# Patient Record
Sex: Female | Born: 1945
Health system: Southern US, Community
[De-identification: ages and names within clinical notes are randomized; demographics above are authoritative.]

## PROBLEM LIST (undated history)

## (undated) DIAGNOSIS — Z87442 Personal history of urinary calculi: Secondary | ICD-10-CM

## (undated) DIAGNOSIS — Z79899 Other long term (current) drug therapy: Secondary | ICD-10-CM

## (undated) DIAGNOSIS — M545 Low back pain, unspecified: Secondary | ICD-10-CM

## (undated) DIAGNOSIS — Z9289 Personal history of other medical treatment: Secondary | ICD-10-CM

## (undated) DIAGNOSIS — F419 Anxiety disorder, unspecified: Secondary | ICD-10-CM

## (undated) DIAGNOSIS — R51 Headache: Secondary | ICD-10-CM

## (undated) DIAGNOSIS — E7800 Pure hypercholesterolemia, unspecified: Secondary | ICD-10-CM

## (undated) DIAGNOSIS — J189 Pneumonia, unspecified organism: Secondary | ICD-10-CM

## (undated) DIAGNOSIS — Z87891 Personal history of nicotine dependence: Secondary | ICD-10-CM

## (undated) DIAGNOSIS — M1990 Unspecified osteoarthritis, unspecified site: Secondary | ICD-10-CM

## (undated) DIAGNOSIS — F32A Depression, unspecified: Secondary | ICD-10-CM

## (undated) DIAGNOSIS — G8929 Other chronic pain: Secondary | ICD-10-CM

## (undated) DIAGNOSIS — N289 Disorder of kidney and ureter, unspecified: Secondary | ICD-10-CM

## (undated) DIAGNOSIS — I10 Essential (primary) hypertension: Secondary | ICD-10-CM

## (undated) DIAGNOSIS — F329 Major depressive disorder, single episode, unspecified: Secondary | ICD-10-CM

## (undated) HISTORY — PX: JOINT REPLACEMENT: SHX530

## (undated) HISTORY — PX: CARPAL TUNNEL RELEASE: SHX101

## (undated) HISTORY — PX: LAPAROSCOPIC CHOLECYSTECTOMY: SUR755

## (undated) HISTORY — DX: Personal history of nicotine dependence: Z87.891

## (undated) HISTORY — DX: Other long term (current) drug therapy: Z79.899

## (undated) HISTORY — PX: KNEE ARTHROSCOPY: SUR90

## (undated) HISTORY — PX: TOTAL HIP ARTHROPLASTY: SHX124

## (undated) HISTORY — PX: BACK SURGERY: SHX140

## (undated) HISTORY — DX: Disorder of kidney and ureter, unspecified: N28.9

---

## 1977-04-04 HISTORY — PX: VAGINAL HYSTERECTOMY: SUR661

## 2003-03-05 ENCOUNTER — Other Ambulatory Visit: Payer: Self-pay

## 2004-06-07 ENCOUNTER — Emergency Department: Payer: Self-pay | Admitting: Emergency Medicine

## 2005-02-17 ENCOUNTER — Ambulatory Visit: Payer: Self-pay | Admitting: Family Medicine

## 2005-04-04 DIAGNOSIS — Z9289 Personal history of other medical treatment: Secondary | ICD-10-CM

## 2005-04-04 HISTORY — DX: Personal history of other medical treatment: Z92.89

## 2005-08-31 ENCOUNTER — Ambulatory Visit: Payer: Self-pay | Admitting: Specialist

## 2006-04-12 ENCOUNTER — Ambulatory Visit: Payer: Self-pay | Admitting: Family Medicine

## 2006-05-22 ENCOUNTER — Emergency Department: Payer: Self-pay | Admitting: Unknown Physician Specialty

## 2006-05-22 ENCOUNTER — Other Ambulatory Visit: Payer: Self-pay

## 2007-05-24 ENCOUNTER — Ambulatory Visit: Payer: Self-pay | Admitting: Family Medicine

## 2008-05-27 ENCOUNTER — Ambulatory Visit: Payer: Self-pay | Admitting: Family Medicine

## 2009-02-06 ENCOUNTER — Ambulatory Visit: Payer: Self-pay | Admitting: Family Medicine

## 2009-06-01 ENCOUNTER — Ambulatory Visit: Payer: Self-pay | Admitting: Family Medicine

## 2009-10-22 ENCOUNTER — Encounter: Payer: Self-pay | Admitting: Physician Assistant

## 2009-11-02 ENCOUNTER — Encounter: Payer: Self-pay | Admitting: Physician Assistant

## 2010-02-18 ENCOUNTER — Ambulatory Visit: Payer: Self-pay | Admitting: Pain Medicine

## 2010-06-17 ENCOUNTER — Ambulatory Visit: Payer: Self-pay | Admitting: Pain Medicine

## 2010-06-22 ENCOUNTER — Ambulatory Visit: Payer: Self-pay | Admitting: Family Medicine

## 2010-06-28 ENCOUNTER — Ambulatory Visit: Payer: Self-pay | Admitting: Pain Medicine

## 2010-07-07 ENCOUNTER — Ambulatory Visit: Payer: Self-pay | Admitting: Pain Medicine

## 2010-08-03 ENCOUNTER — Ambulatory Visit: Payer: Self-pay | Admitting: Pain Medicine

## 2010-12-12 ENCOUNTER — Emergency Department: Payer: Self-pay | Admitting: Internal Medicine

## 2011-01-10 ENCOUNTER — Ambulatory Visit: Payer: Self-pay | Admitting: Neurosurgery

## 2011-06-14 DIAGNOSIS — Z1239 Encounter for other screening for malignant neoplasm of breast: Secondary | ICD-10-CM | POA: Diagnosis not present

## 2011-06-14 DIAGNOSIS — M2550 Pain in unspecified joint: Secondary | ICD-10-CM | POA: Diagnosis not present

## 2011-06-14 DIAGNOSIS — I10 Essential (primary) hypertension: Secondary | ICD-10-CM | POA: Diagnosis not present

## 2011-06-15 DIAGNOSIS — I10 Essential (primary) hypertension: Secondary | ICD-10-CM | POA: Diagnosis not present

## 2011-07-01 DIAGNOSIS — J029 Acute pharyngitis, unspecified: Secondary | ICD-10-CM | POA: Diagnosis not present

## 2011-07-01 DIAGNOSIS — H6690 Otitis media, unspecified, unspecified ear: Secondary | ICD-10-CM | POA: Diagnosis not present

## 2011-07-26 ENCOUNTER — Ambulatory Visit: Payer: Self-pay | Admitting: Family Medicine

## 2011-07-26 DIAGNOSIS — Z1231 Encounter for screening mammogram for malignant neoplasm of breast: Secondary | ICD-10-CM | POA: Diagnosis not present

## 2011-09-16 DIAGNOSIS — F329 Major depressive disorder, single episode, unspecified: Secondary | ICD-10-CM | POA: Diagnosis not present

## 2011-09-16 DIAGNOSIS — G4700 Insomnia, unspecified: Secondary | ICD-10-CM | POA: Diagnosis not present

## 2011-09-16 DIAGNOSIS — F3289 Other specified depressive episodes: Secondary | ICD-10-CM | POA: Diagnosis not present

## 2011-09-16 DIAGNOSIS — M1990 Unspecified osteoarthritis, unspecified site: Secondary | ICD-10-CM | POA: Diagnosis not present

## 2011-09-16 DIAGNOSIS — I10 Essential (primary) hypertension: Secondary | ICD-10-CM | POA: Diagnosis not present

## 2011-11-15 DIAGNOSIS — I10 Essential (primary) hypertension: Secondary | ICD-10-CM | POA: Diagnosis not present

## 2011-11-15 DIAGNOSIS — F329 Major depressive disorder, single episode, unspecified: Secondary | ICD-10-CM | POA: Diagnosis not present

## 2011-11-15 DIAGNOSIS — M24469 Recurrent dislocation, unspecified knee: Secondary | ICD-10-CM | POA: Diagnosis not present

## 2011-11-15 DIAGNOSIS — Z96659 Presence of unspecified artificial knee joint: Secondary | ICD-10-CM | POA: Diagnosis not present

## 2011-11-15 DIAGNOSIS — F3289 Other specified depressive episodes: Secondary | ICD-10-CM | POA: Diagnosis not present

## 2012-01-30 ENCOUNTER — Emergency Department: Payer: Self-pay | Admitting: Emergency Medicine

## 2012-02-03 ENCOUNTER — Emergency Department: Payer: Self-pay | Admitting: Emergency Medicine

## 2012-02-03 DIAGNOSIS — M545 Low back pain, unspecified: Secondary | ICD-10-CM | POA: Diagnosis not present

## 2012-02-03 DIAGNOSIS — F3289 Other specified depressive episodes: Secondary | ICD-10-CM | POA: Diagnosis not present

## 2012-02-03 DIAGNOSIS — M129 Arthropathy, unspecified: Secondary | ICD-10-CM | POA: Diagnosis not present

## 2012-02-03 DIAGNOSIS — F329 Major depressive disorder, single episode, unspecified: Secondary | ICD-10-CM | POA: Diagnosis not present

## 2012-02-03 DIAGNOSIS — Z96649 Presence of unspecified artificial hip joint: Secondary | ICD-10-CM | POA: Diagnosis not present

## 2012-02-03 DIAGNOSIS — M5137 Other intervertebral disc degeneration, lumbosacral region: Secondary | ICD-10-CM | POA: Diagnosis not present

## 2012-02-03 DIAGNOSIS — IMO0002 Reserved for concepts with insufficient information to code with codable children: Secondary | ICD-10-CM | POA: Diagnosis not present

## 2012-02-03 DIAGNOSIS — I10 Essential (primary) hypertension: Secondary | ICD-10-CM | POA: Diagnosis not present

## 2012-02-03 DIAGNOSIS — Z79899 Other long term (current) drug therapy: Secondary | ICD-10-CM | POA: Diagnosis not present

## 2012-03-06 ENCOUNTER — Other Ambulatory Visit: Payer: Self-pay | Admitting: Neurosurgery

## 2012-03-06 DIAGNOSIS — M48061 Spinal stenosis, lumbar region without neurogenic claudication: Secondary | ICD-10-CM

## 2012-03-15 DIAGNOSIS — IMO0002 Reserved for concepts with insufficient information to code with codable children: Secondary | ICD-10-CM | POA: Diagnosis not present

## 2012-03-15 DIAGNOSIS — Z23 Encounter for immunization: Secondary | ICD-10-CM | POA: Diagnosis not present

## 2012-03-15 DIAGNOSIS — E7800 Pure hypercholesterolemia, unspecified: Secondary | ICD-10-CM | POA: Diagnosis not present

## 2012-03-15 DIAGNOSIS — L659 Nonscarring hair loss, unspecified: Secondary | ICD-10-CM | POA: Diagnosis not present

## 2012-03-15 DIAGNOSIS — Z1159 Encounter for screening for other viral diseases: Secondary | ICD-10-CM | POA: Diagnosis not present

## 2012-03-15 DIAGNOSIS — I10 Essential (primary) hypertension: Secondary | ICD-10-CM | POA: Diagnosis not present

## 2012-03-15 DIAGNOSIS — G4700 Insomnia, unspecified: Secondary | ICD-10-CM | POA: Diagnosis not present

## 2012-03-21 ENCOUNTER — Other Ambulatory Visit: Payer: Self-pay | Admitting: Neurosurgery

## 2012-03-21 ENCOUNTER — Ambulatory Visit
Admission: RE | Admit: 2012-03-21 | Discharge: 2012-03-21 | Disposition: A | Discharge status: Home / Self Care | Payer: Medicare Other | Source: Ambulatory Visit | Attending: Neurosurgery | Admitting: Neurosurgery

## 2012-03-21 DIAGNOSIS — M48061 Spinal stenosis, lumbar region without neurogenic claudication: Secondary | ICD-10-CM | POA: Diagnosis not present

## 2012-03-21 DIAGNOSIS — M47817 Spondylosis without myelopathy or radiculopathy, lumbosacral region: Secondary | ICD-10-CM | POA: Diagnosis not present

## 2012-03-21 DIAGNOSIS — M4310 Spondylolisthesis, site unspecified: Secondary | ICD-10-CM | POA: Diagnosis not present

## 2012-03-21 DIAGNOSIS — Z006 Encounter for examination for normal comparison and control in clinical research program: Secondary | ICD-10-CM | POA: Diagnosis not present

## 2012-03-21 DIAGNOSIS — M5126 Other intervertebral disc displacement, lumbar region: Secondary | ICD-10-CM | POA: Diagnosis not present

## 2012-03-21 MED ORDER — GADOBENATE DIMEGLUMINE 529 MG/ML IV SOLN
15.0000 mL | Freq: Once | INTRAVENOUS | Status: AC | PRN
  Administered 2012-03-21: 15 mL via INTRAVENOUS

## 2012-03-30 ENCOUNTER — Encounter (HOSPITAL_COMMUNITY): Payer: Self-pay | Admitting: Pharmacy Technician

## 2012-03-30 ENCOUNTER — Encounter (HOSPITAL_COMMUNITY)
Admission: RE | Admit: 2012-03-30 | Discharge: 2012-03-30 | Disposition: A | Discharge status: Home / Self Care | Payer: Medicare Other | Source: Ambulatory Visit | Attending: Neurosurgery | Admitting: Neurosurgery

## 2012-03-30 ENCOUNTER — Ambulatory Visit (HOSPITAL_COMMUNITY)
Admission: RE | Admit: 2012-03-30 | Discharge: 2012-03-30 | Disposition: A | Discharge status: Home / Self Care | Payer: Medicare Other | Source: Ambulatory Visit | Attending: Neurosurgery | Admitting: Neurosurgery

## 2012-03-30 ENCOUNTER — Encounter (HOSPITAL_COMMUNITY): Payer: Self-pay

## 2012-03-30 DIAGNOSIS — Z01818 Encounter for other preprocedural examination: Secondary | ICD-10-CM | POA: Diagnosis not present

## 2012-03-30 HISTORY — DX: Depression, unspecified: F32.A

## 2012-03-30 HISTORY — DX: Anxiety disorder, unspecified: F41.9

## 2012-03-30 HISTORY — DX: Unspecified osteoarthritis, unspecified site: M19.90

## 2012-03-30 HISTORY — DX: Major depressive disorder, single episode, unspecified: F32.9

## 2012-03-30 HISTORY — DX: Essential (primary) hypertension: I10

## 2012-03-30 LAB — SURGICAL PCR SCREEN
MRSA, PCR: NEGATIVE
Staphylococcus aureus: NEGATIVE

## 2012-03-30 LAB — BASIC METABOLIC PANEL
CO2: 26 mEq/L (ref 19–32)
Chloride: 103 mEq/L (ref 96–112)
Potassium: 4.2 mEq/L (ref 3.5–5.1)
Sodium: 140 mEq/L (ref 135–145)

## 2012-03-30 LAB — TYPE AND SCREEN
ABO/RH(D): A POS
Antibody Screen: NEGATIVE

## 2012-03-30 LAB — CBC
MCV: 85.1 fL (ref 78.0–100.0)
Platelets: 286 10*3/uL (ref 150–400)
RBC: 4.29 MIL/uL (ref 3.87–5.11)
WBC: 9.2 10*3/uL (ref 4.0–10.5)

## 2012-03-30 NOTE — Pre-Procedure Instructions (Signed)
20 Felicia Little  03/30/2012   Your procedure is scheduled on:  Friday  04/06/12  Report to [facility] at 530 AM.  Call this number if you have problems the morning of surgery: [phone]   Remember:   Do not eat food OR DRINK :After Midnight.    Take these medicines the morning of surgery with A SIP OF WATER: XANAX, ATENOLOL(TENORMIN), NEURONTIN, TRAMADOL IF NEEDED   Do not wear jewelry, make-up or nail polish.  Do not wear lotions, powders, or perfumes. You may wear deodorant.  Do not shave 48 hours prior to surgery. Men may shave face and neck.  Do not bring valuables to the hospital.  Contacts, dentures or bridgework may not be worn into surgery.  Leave suitcase in the car. After surgery it may be brought to your room.  For patients admitted to the hospital, checkout time is 11:00 AM the day of discharge.   Patients discharged the day of surgery will not be allowed to drive home.  Name and phone number of your driver:   Special Instructions: Shower using CHG 2 nights before surgery and the night before surgery.  If you shower the day of surgery use CHG.  Use special wash - you have one bottle of CHG for all showers.  You should use approximately 1/3 of the bottle for each shower.   Please read over the following fact sheets that you were given: Pain Booklet, Coughing and Deep Breathing, Blood Transfusion Information, MRSA Information and Surgical Site Infection Prevention

## 2012-04-05 MED ORDER — CEFAZOLIN SODIUM-DEXTROSE 2-3 GM-% IV SOLR
2.0000 g | INTRAVENOUS | Status: AC
  Administered 2012-04-06: 2 g via INTRAVENOUS
  Filled 2012-04-05: qty 50

## 2012-04-06 ENCOUNTER — Encounter (HOSPITAL_COMMUNITY): Payer: Self-pay | Admitting: Certified Registered Nurse Anesthetist

## 2012-04-06 ENCOUNTER — Inpatient Hospital Stay (HOSPITAL_COMMUNITY): Payer: Medicare Other

## 2012-04-06 ENCOUNTER — Encounter (HOSPITAL_COMMUNITY)
Admission: RE | Disposition: A | Discharge status: Home Health | Payer: Self-pay | Source: Ambulatory Visit | Attending: Neurosurgery

## 2012-04-06 ENCOUNTER — Encounter (HOSPITAL_COMMUNITY): Payer: Self-pay | Admitting: General Practice

## 2012-04-06 ENCOUNTER — Inpatient Hospital Stay (HOSPITAL_COMMUNITY)
Admission: RE | Admit: 2012-04-06 | Discharge: 2012-04-16 | DRG: 460 | Disposition: A | Discharge status: Home Health | Payer: Medicare Other | Source: Ambulatory Visit | Attending: Neurosurgery | Admitting: Neurosurgery

## 2012-04-06 ENCOUNTER — Inpatient Hospital Stay (HOSPITAL_COMMUNITY): Payer: Medicare Other | Admitting: Certified Registered Nurse Anesthetist

## 2012-04-06 DIAGNOSIS — M129 Arthropathy, unspecified: Secondary | ICD-10-CM | POA: Diagnosis present

## 2012-04-06 DIAGNOSIS — M4310 Spondylolisthesis, site unspecified: Secondary | ICD-10-CM | POA: Diagnosis not present

## 2012-04-06 DIAGNOSIS — F329 Major depressive disorder, single episode, unspecified: Secondary | ICD-10-CM | POA: Diagnosis present

## 2012-04-06 DIAGNOSIS — M539 Dorsopathy, unspecified: Secondary | ICD-10-CM | POA: Diagnosis not present

## 2012-04-06 DIAGNOSIS — F411 Generalized anxiety disorder: Secondary | ICD-10-CM | POA: Diagnosis present

## 2012-04-06 DIAGNOSIS — M549 Dorsalgia, unspecified: Secondary | ICD-10-CM | POA: Diagnosis not present

## 2012-04-06 DIAGNOSIS — M545 Low back pain, unspecified: Secondary | ICD-10-CM | POA: Diagnosis not present

## 2012-04-06 DIAGNOSIS — F3289 Other specified depressive episodes: Secondary | ICD-10-CM | POA: Diagnosis present

## 2012-04-06 DIAGNOSIS — Z87891 Personal history of nicotine dependence: Secondary | ICD-10-CM

## 2012-04-06 DIAGNOSIS — Z96649 Presence of unspecified artificial hip joint: Secondary | ICD-10-CM | POA: Diagnosis not present

## 2012-04-06 DIAGNOSIS — I10 Essential (primary) hypertension: Secondary | ICD-10-CM | POA: Diagnosis not present

## 2012-04-06 DIAGNOSIS — T8130XA Disruption of wound, unspecified, initial encounter: Secondary | ICD-10-CM | POA: Diagnosis not present

## 2012-04-06 DIAGNOSIS — M79609 Pain in unspecified limb: Secondary | ICD-10-CM | POA: Diagnosis not present

## 2012-04-06 DIAGNOSIS — M48061 Spinal stenosis, lumbar region without neurogenic claudication: Secondary | ICD-10-CM | POA: Diagnosis not present

## 2012-04-06 DIAGNOSIS — T8140XA Infection following a procedure, unspecified, initial encounter: Secondary | ICD-10-CM | POA: Diagnosis not present

## 2012-04-06 DIAGNOSIS — Y831 Surgical operation with implant of artificial internal device as the cause of abnormal reaction of the patient, or of later complication, without mention of misadventure at the time of the procedure: Secondary | ICD-10-CM | POA: Diagnosis not present

## 2012-04-06 DIAGNOSIS — Q762 Congenital spondylolisthesis: Secondary | ICD-10-CM | POA: Diagnosis not present

## 2012-04-06 DIAGNOSIS — G969 Disorder of central nervous system, unspecified: Secondary | ICD-10-CM | POA: Diagnosis not present

## 2012-04-06 DIAGNOSIS — Y9210 Unspecified residential institution as the place of occurrence of the external cause: Secondary | ICD-10-CM | POA: Diagnosis not present

## 2012-04-06 DIAGNOSIS — Z79899 Other long term (current) drug therapy: Secondary | ICD-10-CM

## 2012-04-06 DIAGNOSIS — J9819 Other pulmonary collapse: Secondary | ICD-10-CM | POA: Diagnosis not present

## 2012-04-06 DIAGNOSIS — G96198 Other disorders of meninges, not elsewhere classified: Secondary | ICD-10-CM | POA: Diagnosis not present

## 2012-04-06 DIAGNOSIS — Z981 Arthrodesis status: Secondary | ICD-10-CM | POA: Diagnosis not present

## 2012-04-06 HISTORY — DX: Low back pain, unspecified: M54.50

## 2012-04-06 HISTORY — DX: Pneumonia, unspecified organism: J18.9

## 2012-04-06 HISTORY — DX: Other chronic pain: G89.29

## 2012-04-06 HISTORY — DX: Headache: R51

## 2012-04-06 HISTORY — PX: POSTERIOR LUMBAR FUSION: SHX6036

## 2012-04-06 HISTORY — DX: Low back pain: M54.5

## 2012-04-06 SURGERY — POSTERIOR LUMBAR FUSION 1 LEVEL
Anesthesia: General | Site: Back | Wound class: Clean

## 2012-04-06 MED ORDER — MEPERIDINE HCL 25 MG/ML IJ SOLN: 6.2500 mg | INTRAMUSCULAR | Status: DC | PRN

## 2012-04-06 MED ORDER — SODIUM CHLORIDE 0.9 % IV SOLN: 250.0000 mL | INTRAVENOUS | Status: DC

## 2012-04-06 MED ORDER — TRIAMTERENE-HCTZ 75-50 MG PO TABS
0.5000 | ORAL_TABLET | Freq: Every day | ORAL | Status: DC
  Filled 2012-04-06: qty 0.5

## 2012-04-06 MED ORDER — ONDANSETRON HCL 4 MG/2ML IJ SOLN: 4.0000 mg | INTRAMUSCULAR | Status: DC | PRN

## 2012-04-06 MED ORDER — ONDANSETRON HCL 4 MG/2ML IJ SOLN
INTRAMUSCULAR | Status: DC | PRN
  Administered 2012-04-06: 4 mg via INTRAVENOUS

## 2012-04-06 MED ORDER — OXYCODONE-ACETAMINOPHEN 5-325 MG PO TABS
1.0000 | ORAL_TABLET | ORAL | Status: DC | PRN
  Administered 2012-04-06 – 2012-04-07 (×8): 2 via ORAL
  Filled 2012-04-06 (×8): qty 2

## 2012-04-06 MED ORDER — GABAPENTIN 300 MG PO CAPS
600.0000 mg | ORAL_CAPSULE | Freq: Every day | ORAL | Status: DC
  Administered 2012-04-06 – 2012-04-15 (×10): 600 mg via ORAL
  Filled 2012-04-06 (×11): qty 2

## 2012-04-06 MED ORDER — PHENYLEPHRINE HCL 10 MG/ML IJ SOLN
INTRAMUSCULAR | Status: DC | PRN
  Administered 2012-04-06: 80 ug via INTRAVENOUS
  Administered 2012-04-06 (×3): 40 ug via INTRAVENOUS
  Administered 2012-04-06: 80 ug via INTRAVENOUS

## 2012-04-06 MED ORDER — HYDROMORPHONE HCL PF 1 MG/ML IJ SOLN
1.0000 mg | INTRAMUSCULAR | Status: DC | PRN
  Administered 2012-04-06 – 2012-04-09 (×3): 1 mg via INTRAMUSCULAR
  Filled 2012-04-06 (×2): qty 1

## 2012-04-06 MED ORDER — ALPRAZOLAM 0.5 MG PO TABS
0.5000 mg | ORAL_TABLET | Freq: Two times a day (BID) | ORAL | Status: DC | PRN
  Administered 2012-04-09 – 2012-04-15 (×10): 0.5 mg via ORAL
  Filled 2012-04-06 (×10): qty 1

## 2012-04-06 MED ORDER — HYDROMORPHONE HCL PF 1 MG/ML IJ SOLN
INTRAMUSCULAR | Status: AC
  Administered 2012-04-06: 0.5 mg via INTRAVENOUS
  Filled 2012-04-06: qty 1

## 2012-04-06 MED ORDER — HYDROMORPHONE HCL PF 1 MG/ML IJ SOLN
0.2500 mg | INTRAMUSCULAR | Status: DC | PRN
  Administered 2012-04-06 (×4): 0.5 mg via INTRAVENOUS

## 2012-04-06 MED ORDER — ARTIFICIAL TEARS OP OINT
TOPICAL_OINTMENT | OPHTHALMIC | Status: DC | PRN
  Administered 2012-04-06: 1 via OPHTHALMIC

## 2012-04-06 MED ORDER — ACETAMINOPHEN 650 MG RE SUPP: 650.0000 mg | RECTAL | Status: DC | PRN

## 2012-04-06 MED ORDER — GLYCOPYRROLATE 0.2 MG/ML IJ SOLN
INTRAMUSCULAR | Status: DC | PRN
  Administered 2012-04-06: 0.6 mg via INTRAVENOUS

## 2012-04-06 MED ORDER — DEXAMETHASONE SODIUM PHOSPHATE 10 MG/ML IJ SOLN
INTRAMUSCULAR | Status: AC
  Filled 2012-04-06: qty 1

## 2012-04-06 MED ORDER — SODIUM CHLORIDE 0.9 % IR SOLN
Status: DC | PRN
  Administered 2012-04-06: 08:00:00

## 2012-04-06 MED ORDER — MENTHOL 3 MG MT LOZG: 1.0000 | LOZENGE | OROMUCOSAL | Status: DC | PRN

## 2012-04-06 MED ORDER — ROCURONIUM BROMIDE 100 MG/10ML IV SOLN
INTRAVENOUS | Status: DC | PRN
  Administered 2012-04-06: 50 mg via INTRAVENOUS

## 2012-04-06 MED ORDER — OXYCODONE HCL 5 MG/5ML PO SOLN: 5.0000 mg | Freq: Once | ORAL | Status: AC | PRN

## 2012-04-06 MED ORDER — LACTATED RINGERS IV SOLN
INTRAVENOUS | Status: DC | PRN
  Administered 2012-04-06 (×2): via INTRAVENOUS

## 2012-04-06 MED ORDER — 0.9 % SODIUM CHLORIDE (POUR BTL) OPTIME
TOPICAL | Status: DC | PRN
  Administered 2012-04-06: 1000 mL

## 2012-04-06 MED ORDER — PROPOFOL 10 MG/ML IV BOLUS
INTRAVENOUS | Status: DC | PRN
  Administered 2012-04-06: 100 mg via INTRAVENOUS

## 2012-04-06 MED ORDER — SODIUM CHLORIDE 0.9 % IV SOLN
INTRAVENOUS | Status: AC
  Filled 2012-04-06: qty 500

## 2012-04-06 MED ORDER — ACETAMINOPHEN 325 MG PO TABS
650.0000 mg | ORAL_TABLET | ORAL | Status: DC | PRN
  Administered 2012-04-08 – 2012-04-11 (×7): 650 mg via ORAL
  Filled 2012-04-06 (×2): qty 2
  Filled 2012-04-06: qty 1
  Filled 2012-04-06 (×4): qty 2

## 2012-04-06 MED ORDER — NEOSTIGMINE METHYLSULFATE 1 MG/ML IJ SOLN
INTRAMUSCULAR | Status: DC | PRN
  Administered 2012-04-06: 4 mg via INTRAVENOUS

## 2012-04-06 MED ORDER — LIDOCAINE HCL 4 % MT SOLN
OROMUCOSAL | Status: DC | PRN
  Administered 2012-04-06: 4 mL via TOPICAL

## 2012-04-06 MED ORDER — CEFAZOLIN SODIUM-DEXTROSE 2-3 GM-% IV SOLR
2.0000 g | Freq: Three times a day (TID) | INTRAVENOUS | Status: AC
  Administered 2012-04-06 – 2012-04-07 (×3): 2 g via INTRAVENOUS
  Filled 2012-04-06 (×3): qty 50

## 2012-04-06 MED ORDER — ATENOLOL 50 MG PO TABS
50.0000 mg | ORAL_TABLET | Freq: Every day | ORAL | Status: DC
  Administered 2012-04-07 – 2012-04-16 (×10): 50 mg via ORAL
  Filled 2012-04-06 (×10): qty 1

## 2012-04-06 MED ORDER — HYDROMORPHONE HCL PF 1 MG/ML IJ SOLN
INTRAMUSCULAR | Status: AC
  Filled 2012-04-06: qty 1

## 2012-04-06 MED ORDER — VECURONIUM BROMIDE 10 MG IV SOLR
INTRAVENOUS | Status: DC | PRN
  Administered 2012-04-06: 2 mg via INTRAVENOUS

## 2012-04-06 MED ORDER — BACITRACIN 50000 UNITS IM SOLR
INTRAMUSCULAR | Status: AC
  Filled 2012-04-06: qty 1

## 2012-04-06 MED ORDER — NORTRIPTYLINE HCL 25 MG PO CAPS
75.0000 mg | ORAL_CAPSULE | Freq: Every day | ORAL | Status: DC
  Administered 2012-04-06 – 2012-04-15 (×10): 75 mg via ORAL
  Filled 2012-04-06 (×11): qty 3

## 2012-04-06 MED ORDER — LIDOCAINE HCL (CARDIAC) 20 MG/ML IV SOLN
INTRAVENOUS | Status: DC | PRN
  Administered 2012-04-06: 100 mg via INTRAVENOUS

## 2012-04-06 MED ORDER — THROMBIN 20000 UNITS EX SOLR
CUTANEOUS | Status: DC | PRN
  Administered 2012-04-06: 08:00:00 via TOPICAL

## 2012-04-06 MED ORDER — EPHEDRINE SULFATE 50 MG/ML IJ SOLN
INTRAMUSCULAR | Status: DC | PRN
  Administered 2012-04-06 (×3): 5 mg via INTRAVENOUS

## 2012-04-06 MED ORDER — FENTANYL CITRATE 0.05 MG/ML IJ SOLN
INTRAMUSCULAR | Status: DC | PRN
  Administered 2012-04-06 (×3): 50 ug via INTRAVENOUS
  Administered 2012-04-06: 100 ug via INTRAVENOUS

## 2012-04-06 MED ORDER — SODIUM CHLORIDE 0.9 % IJ SOLN: 3.0000 mL | INTRAMUSCULAR | Status: DC | PRN

## 2012-04-06 MED ORDER — WHITE PETROLATUM GEL
Status: AC
  Administered 2012-04-06: 17:00:00
  Filled 2012-04-06: qty 5

## 2012-04-06 MED ORDER — BUPIVACAINE HCL (PF) 0.5 % IJ SOLN
INTRAMUSCULAR | Status: DC | PRN
  Administered 2012-04-06: 20 mL

## 2012-04-06 MED ORDER — OXYCODONE HCL 5 MG PO TABS
5.0000 mg | ORAL_TABLET | Freq: Once | ORAL | Status: AC | PRN
  Administered 2012-04-06: 5 mg via ORAL

## 2012-04-06 MED ORDER — MIDAZOLAM HCL 5 MG/5ML IJ SOLN
INTRAMUSCULAR | Status: DC | PRN
  Administered 2012-04-06: 2 mg via INTRAVENOUS

## 2012-04-06 MED ORDER — KCL IN DEXTROSE-NACL 20-5-0.45 MEQ/L-%-% IV SOLN
80.0000 mL/h | INTRAVENOUS | Status: DC
  Administered 2012-04-06: 80 mL/h via INTRAVENOUS
  Filled 2012-04-06 (×11): qty 1000

## 2012-04-06 MED ORDER — PHENOL 1.4 % MT LIQD
1.0000 | OROMUCOSAL | Status: DC | PRN
  Administered 2012-04-06: 1 via OROMUCOSAL
  Filled 2012-04-06: qty 177

## 2012-04-06 MED ORDER — TRIAMTERENE-HCTZ 37.5-25 MG PO TABS
1.0000 | ORAL_TABLET | Freq: Every day | ORAL | Status: DC
  Administered 2012-04-06 – 2012-04-16 (×11): 1 via ORAL
  Filled 2012-04-06 (×11): qty 1

## 2012-04-06 MED ORDER — SODIUM CHLORIDE 0.9 % IJ SOLN
3.0000 mL | Freq: Two times a day (BID) | INTRAMUSCULAR | Status: DC
  Administered 2012-04-07 – 2012-04-16 (×9): 3 mL via INTRAVENOUS

## 2012-04-06 MED ORDER — ONDANSETRON HCL 4 MG/2ML IJ SOLN: 4.0000 mg | Freq: Once | INTRAMUSCULAR | Status: DC | PRN

## 2012-04-06 MED ORDER — DEXAMETHASONE SODIUM PHOSPHATE 10 MG/ML IJ SOLN
10.0000 mg | INTRAMUSCULAR | Status: AC
  Administered 2012-04-06: 10 mg via INTRAVENOUS

## 2012-04-06 MED ORDER — OXYCODONE HCL 5 MG PO TABS
ORAL_TABLET | ORAL | Status: AC
  Administered 2012-04-06: 5 mg via ORAL
  Filled 2012-04-06: qty 1

## 2012-04-06 SURGICAL SUPPLY — 64 items
BAG DECANTER FOR FLEXI CONT (MISCELLANEOUS) ×2 IMPLANT
BENZOIN TINCTURE PRP APPL 2/3 (GAUZE/BANDAGES/DRESSINGS) ×2 IMPLANT
BLADE SURG ROTATE 9660 (MISCELLANEOUS) IMPLANT
BNDG COHESIVE 4X5 WHT NS (GAUZE/BANDAGES/DRESSINGS) ×2 IMPLANT
BONE EQUIVA 10CC (Bone Implant) ×2 IMPLANT
BRUSH SCRUB EZ PLAIN DRY (MISCELLANEOUS) ×2 IMPLANT
BUR CUTTER 7.0 ROUND (BURR) ×2 IMPLANT
BUR MATCHSTICK NEURO 3.0 LAGG (BURR) ×2 IMPLANT
CANISTER SUCTION 2500CC (MISCELLANEOUS) ×2 IMPLANT
CLOTH BEACON ORANGE TIMEOUT ST (SAFETY) ×2 IMPLANT
CONT SPEC 4OZ CLIKSEAL STRL BL (MISCELLANEOUS) ×4 IMPLANT
COVER BACK TABLE 24X17X13 BIG (DRAPES) IMPLANT
COVER TABLE BACK 60X90 (DRAPES) ×2 IMPLANT
DERMABOND ADHESIVE PROPEN (GAUZE/BANDAGES/DRESSINGS) ×1
DERMABOND ADVANCED (GAUZE/BANDAGES/DRESSINGS)
DERMABOND ADVANCED .7 DNX12 (GAUZE/BANDAGES/DRESSINGS) IMPLANT
DERMABOND ADVANCED .7 DNX6 (GAUZE/BANDAGES/DRESSINGS) ×1 IMPLANT
DRAPE C-ARM 42X72 X-RAY (DRAPES) ×4 IMPLANT
DRAPE LAPAROTOMY 100X72X124 (DRAPES) ×2 IMPLANT
DRAPE SURG 17X23 STRL (DRAPES) ×4 IMPLANT
DRESSING TELFA 8X3 (GAUZE/BANDAGES/DRESSINGS) ×2 IMPLANT
ELECT REM PT RETURN 9FT ADLT (ELECTROSURGICAL) ×2
ELECTRODE REM PT RTRN 9FT ADLT (ELECTROSURGICAL) ×1 IMPLANT
EVACUATOR 1/8 PVC DRAIN (DRAIN) ×2 IMPLANT
GAUZE SPONGE 4X4 16PLY XRAY LF (GAUZE/BANDAGES/DRESSINGS) IMPLANT
GLOVE BIO SURGEON STRL SZ8.5 (GLOVE) ×2 IMPLANT
GLOVE BIOGEL PI IND STRL 8.5 (GLOVE) ×1 IMPLANT
GLOVE BIOGEL PI INDICATOR 8.5 (GLOVE) ×1
GLOVE ECLIPSE 7.5 STRL STRAW (GLOVE) ×4 IMPLANT
GLOVE EXAM NITRILE LRG STRL (GLOVE) IMPLANT
GLOVE EXAM NITRILE MD LF STRL (GLOVE) IMPLANT
GLOVE EXAM NITRILE XS STR PU (GLOVE) IMPLANT
GLOVE SS BIOGEL STRL SZ 8 (GLOVE) ×1 IMPLANT
GLOVE SUPERSENSE BIOGEL SZ 8 (GLOVE) ×1
GLOVE SURG SS PI 7.5 STRL IVOR (GLOVE) ×2 IMPLANT
GLOVE SURG SS PI 8.0 STRL IVOR (GLOVE) ×6 IMPLANT
GOWN BRE IMP SLV AUR LG STRL (GOWN DISPOSABLE) IMPLANT
GOWN BRE IMP SLV AUR XL STRL (GOWN DISPOSABLE) ×6 IMPLANT
GOWN STRL REIN 2XL LVL4 (GOWN DISPOSABLE) ×2 IMPLANT
KIT BASIN OR (CUSTOM PROCEDURE TRAY) ×2 IMPLANT
KIT ROOM TURNOVER OR (KITS) ×2 IMPLANT
LUCENT STRAIGHT 10X22X10 (Set) ×4 IMPLANT
NEEDLE HYPO 22GX1.5 SAFETY (NEEDLE) ×2 IMPLANT
NS IRRIG 1000ML POUR BTL (IV SOLUTION) ×2 IMPLANT
PACK LAMINECTOMY NEURO (CUSTOM PROCEDURE TRAY) ×2 IMPLANT
PAD ARMBOARD 7.5X6 YLW CONV (MISCELLANEOUS) ×6 IMPLANT
PATTIES SURGICAL .75X.75 (GAUZE/BANDAGES/DRESSINGS) IMPLANT
ROD BENT 40MM (Rod) ×4 IMPLANT
SCREW POLY 6.5X40MM (Screw) ×8 IMPLANT
SPONGE GAUZE 4X4 12PLY (GAUZE/BANDAGES/DRESSINGS) ×2 IMPLANT
SPONGE LAP 4X18 X RAY DECT (DISPOSABLE) IMPLANT
SPONGE SURGIFOAM ABS GEL 100 (HEMOSTASIS) ×2 IMPLANT
STRIP CLOSURE SKIN 1/2X4 (GAUZE/BANDAGES/DRESSINGS) ×2 IMPLANT
SUT VIC AB 0 CT1 18XCR BRD8 (SUTURE) ×1 IMPLANT
SUT VIC AB 0 CT1 8-18 (SUTURE) ×1
SUT VIC AB 2-0 OS6 18 (SUTURE) ×8 IMPLANT
SUT VIC AB 3-0 CP2 18 (SUTURE) ×2 IMPLANT
SYR 20ML ECCENTRIC (SYRINGE) ×2 IMPLANT
TOP CLSR SEQUOIA (Orthopedic Implant) ×8 IMPLANT
TOWEL OR 17X24 6PK STRL BLUE (TOWEL DISPOSABLE) ×2 IMPLANT
TOWEL OR 17X26 10 PK STRL BLUE (TOWEL DISPOSABLE) ×2 IMPLANT
TRAP SPECIMEN MUCOUS 40CC (MISCELLANEOUS) ×2 IMPLANT
TRAY FOLEY CATH 14FRSI W/METER (CATHETERS) ×2 IMPLANT
WATER STERILE IRR 1000ML POUR (IV SOLUTION) ×2 IMPLANT

## 2012-04-06 NOTE — Anesthesia Preprocedure Evaluation (Signed)
Anesthesia Evaluation  Patient identified by MRN, date of birth, ID band Patient awake    Reviewed: Allergy & Precautions, H&P , NPO status , Patient's Chart, lab work & pertinent test results  Airway Mallampati: I TM Distance: >3 FB Neck ROM: Full    Dental   Pulmonary          Cardiovascular hypertension, Pt. on medications     Neuro/Psych    GI/Hepatic   Endo/Other    Renal/GU      Musculoskeletal   Abdominal   Peds  Hematology   Anesthesia Other Findings   Reproductive/Obstetrics                           Anesthesia Physical Anesthesia Plan  ASA: II  Anesthesia Plan: General   Post-op Pain Management:    Induction: Intravenous  Airway Management Planned: Oral ETT  Additional Equipment:   Intra-op Plan:   Post-operative Plan: Extubation in OR  Informed Consent: I have reviewed the patients History and Physical, chart, labs and discussed the procedure including the risks, benefits and alternatives for the proposed anesthesia with the patient or authorized representative who has indicated his/her understanding and acceptance.     Plan Discussed with: CRNA and Surgeon  Anesthesia Plan Comments:         Anesthesia Quick Evaluation  

## 2012-04-06 NOTE — Transfer of Care (Signed)
Immediate Anesthesia Transfer of Care Note  Patient: Felicia Little  Procedure(s) Performed: Procedure(s) (LRB) with comments: POSTERIOR LUMBAR FUSION 1 LEVEL (N/A) - Lumbar four-five posterior lumbar interbody fusion with pedicle screws   Patient Location: PACU  Anesthesia Type:General  Level of Consciousness: awake and alert   Airway & Oxygen Therapy: Patient Spontanous Breathing and Patient connected to nasal cannula oxygen  Post-op Assessment: Report given to PACU RN, Post -op Vital signs reviewed and stable and Patient moving all extremities X 4  Post vital signs: Reviewed and stable  Complications: No apparent anesthesia complications

## 2012-04-06 NOTE — Anesthesia Postprocedure Evaluation (Signed)
Anesthesia Post Note  Patient: Felicia Little  Procedure(s) Performed: Procedure(s) (LRB): POSTERIOR LUMBAR FUSION 1 LEVEL (N/A)  Anesthesia type: general  Patient location: PACU  Post pain: Pain level controlled  Post assessment: Patient's Cardiovascular Status Stable  Last Vitals:  Filed Vitals:   04/06/12 1300  BP:   Pulse: 84  Temp: 36.7 C  Resp: 10    Post vital signs: Reviewed and stable  Level of consciousness: sedated  Complications: No apparent anesthesia complications

## 2012-04-06 NOTE — H&P (Signed)
  Felicia Little is an 67 y.o. female.   Chief Complaint: Back and bilateral leg pain HPI: The patient is a 67 year old female who presented with back and bilateral leg pain. She was tried on conservative therapy without any improvement including medications and injections. She underwent imaging studies which showed a spondylolisthesis at L4-5 with marked stenosis was felt that this was her clinical problem. After she failed additional conservative therapy she requested surgery and now comes for an L4-5 posterior lumbar interbody fusion with pedicle screw fixation. I had a long discussion with her regarding the risks and benefits of nonintervention. The risks discussed include but are not limited to bleeding infection weakness numbness paralysis trouble with instrumentation nonunion spinal fluid leakage coma and death. We have discussed alternative methods of therapy offered risks and benefits of nonintervention. She has had the opportunity to rest numerous questions and appears to understand. With this information in hand she has requested we proceed with surgery.  Past Medical History  Diagnosis Date  . Hypertension   . Depression   . Anxiety   . Arthritis     Past Surgical History  Procedure Date  . Joint replacement     BIL  HIP  REPLACEMENTS   . Carpal tunnel release      RIGHT  . Cholecystectomy     1990 S  . Knee arthroscopy     RIGHT   1990S    No family history on file. Social History:  reports that she has quit smoking. She does not have any smokeless tobacco history on file. She reports that she does not drink alcohol or use illicit drugs.  Allergies: No Known Allergies  Medications Prior to Admission  Medication Sig Dispense Refill  . ALPRAZolam (XANAX) 0.5 MG tablet Take 0.5 mg by mouth 2 (two) times daily as needed. For anxiety      . atenolol (TENORMIN) 50 MG tablet Take 50 mg by mouth daily.      Marland Kitchen gabapentin (NEURONTIN) 300 MG capsule Take 600 mg by mouth at bedtime.       . nortriptyline (PAMELOR) 75 MG capsule Take 75 mg by mouth at bedtime.      . traMADol-acetaminophen (ULTRACET) 37.5-325 MG per tablet Take 1 tablet by mouth every 6 (six) hours as needed. For pain      . triamterene-hydrochlorothiazide (MAXZIDE) 75-50 MG per tablet Take 0.5 tablets by mouth daily.        No results found for this or any previous visit (from the past 48 hour(s)). No results found.  Review of systems not obtained due to patient factors.  Blood pressure 150/84, pulse 65, temperature 98.3 F (36.8 C), temperature source Oral, resp. rate 20, SpO2 95.00%.  The patient is awake alert and oriented. His no facial asymmetry. Her gait is slow mildly antalgic. Reflexes are decreased but equal. Her strength is intact as is her sensation. Assessment/Plan Impression is that of spinal stenosis with spondylolisthesis at L4-5. The plan is for an L4-5 decompression with interbody fusion and pedicle screw fixation.  Reinaldo Meeker, MD 04/06/2012, 7:31 AM

## 2012-04-06 NOTE — Op Note (Signed)
Preop diagnosis: Spondylolisthesis and spinal stenosis L4-5 with compression of L4 and L5 nerve roots Postop diagnosis: Same Procedure: Bilateral L4-5 decompressive laminectomy for decompression of L4 and L5 nerve roots more so than needed for interbody fusion followed by L4-5 bilateral microdiscectomy followed by L4-5 posterior lumbar interbody fusion with peek interbody spacers followed by nonsegmental instrumentation L4-5 with [place] pedicle screw system followed by L4-5 posterior lateral fusion Surgeon: Nykerria Macconnell Assistant: Lovell Sheehan  After and placed the prone position the patient's back was prepped and draped in the usual sterile fashion. Localizing fluoroscopy was used prior to incision to identify the appropriate level. Midline incision was made above the spinous process of L3-L4 and L5. Using Bovie cutting current the incision was carried on the spinous processes. Some periosteal dissection was then carried out bilaterally to identify the lamina facet joint and transverse processes of L4 and L5. Self-retaining tract was placed for exposure and x-ray showed approach the appropriate level. Spinous processes were then removed with the Leksell rongeur and a generous laminotomy was performed removing the inferior 80% of the L4 lamina the medial two thirds of the facet joint and the superior one half of the L5 lamina. Residual bone and ligamentum flavum removed in a piecemeal fashion. Cystic material on the dura was peeled away gently to further decompress the thecal sac. At this time inspection was carried out in all directions any evidence of residual compression and none could be identified. L4 and L5 nerve roots were well visualized and decompressed. Disc space was then entered and thoroughly cleaned out bilaterally with a variety of pituitary rongeurs and curettes. We then prepared the disc for interbody fusion. We distracted the disc space up to a 10 mm size and felt this was a good fit. We then used  rotating cutter to decorticate the endplates and placed a 10 mm cage on the first side. On the opposite side we once again used rotating cutter prepared its for interbody fusion. Prior to placing the second spacer we placed a mixture of autologous bone morselized allograft to the interspace to help with interbody fusion. We then placed a second space without difficulty. We then placed pedicle screws in standard fashion. We drill hole entry point followed by passing of the pedicle all. We then tapped with a 5.5 mm tap and placed 6.5 x 40 mm screws at L4 and L5 bilaterally. Good position was confirmed by AP lateral fluoroscopy. We then decorticated the far lateral region placed a mixture of autologous bone morselized allograft for the posterolateral fusion. We then chose appropriate length rods and secured them to the top of the screws with top loading nuts. We did torque and counter torque final tightening and compressed L4 down and L5 bilaterally. We then irrigated copiously metabolic irrigation and controlled any bleeding with the upper coagulation Gelfoam. We then left an epidural drain in the epidural space and brought out through a separate stab incision. The was then closed in multiple layers of Vicryl on the muscle fascia subcutaneous and subcuticular tissues. Dermabond Steri-Strips were placed on the skin. Shortness was then applied the patient was extubated and taken to recovery in stable condition.

## 2012-04-06 NOTE — Progress Notes (Signed)
Patient is complaining about the location her IV is in, called IV team to replace it.

## 2012-04-06 NOTE — Anesthesia Procedure Notes (Signed)
Procedure Name: Intubation Date/Time: 04/06/2012 7:44 AM Performed by: Margaree Mackintosh Pre-anesthesia Checklist: Patient identified, Timeout performed, Emergency Drugs available, Suction available and Patient being monitored Patient Re-evaluated:Patient Re-evaluated prior to inductionOxygen Delivery Method: Circle system utilized Preoxygenation: Pre-oxygenation with 100% oxygen Intubation Type: IV induction Ventilation: Mask ventilation without difficulty Laryngoscope Size: Mac and 3 Grade View: Grade I Tube type: Oral Tube size: 7.0 mm Number of attempts: 1 Airway Equipment and Method: Stylet and LTA kit utilized Placement Confirmation: ETT inserted through vocal cords under direct vision,  positive ETCO2 and breath sounds checked- equal and bilateral Secured at: 20 cm Tube secured with: Tape Dental Injury: Teeth and Oropharynx as per pre-operative assessment

## 2012-04-06 NOTE — Preoperative (Signed)
Beta Blockers   Reason not to administer Beta Blockers:Not Applicable, took atenolol this am 

## 2012-04-07 NOTE — Evaluation (Signed)
Physical Therapy Evaluation Patient Details Name: Felicia Little MRN: [id number] DOB: 1946/04/03 Today's Date: 04/07/2012 Time: 4098-1191 PT Time Calculation (min): 34 min  PT Assessment / Plan / Recommendation Clinical Impression  Pt s/p PLF L4-5. Pt presenting with decreased functional mobility and safety. Pt will benefit from skilled PT in the acute care setting in order to maximize functional mobility and safety prior to discharge home with family.     PT Assessment  Patient needs continued PT services    Follow Up Recommendations  No PT follow up;Supervision for mobility/OOB    Does the patient have the potential to tolerate intense rehabilitation      Barriers to Discharge        Equipment Recommendations  None recommended by PT    Recommendations for Other Services     Frequency Min 5X/week    Precautions / Restrictions Precautions Precautions: Back Precaution Booklet Issued: Yes (comment) Precaution Comments: Educated pt and daughter on 3/3 back precautions. Required Braces or Orthoses: Spinal Brace Spinal Brace: Lumbar corset Restrictions Weight Bearing Restrictions: No   Pertinent Vitals/Pain Pain 9/10. RN in room prior to session with pain meds.       Mobility  Bed Mobility Bed Mobility: Rolling Left;Left Sidelying to Sit;Sitting - Scoot to Edge of Bed Rolling Left: 4: Min assist;With rail Left Sidelying to Sit: 3: Mod assist;HOB flat;With rails Sitting - Scoot to Edge of Bed: 4: Min guard Details for Bed Mobility Assistance: VC for sequencing and log roll technique.  Assist to bring trunk up OOB. Transfers Sit to Stand: 4: Min assist;From bed;With upper extremity assist Stand to Sit: 4: Min assist;To chair/3-in-1;With armrests Details for Transfer Assistance: Assist for powerup from  bed and for steadying.  VC for safe hand placement. Ambulation/Gait Ambulation/Gait Assistance: 4: Min guard Ambulation Distance (Feet): 100 Feet Assistive device: Rolling  walker Ambulation/Gait Assistance Details: VC for safe distance to RW as well as upright posture. Minguard for safety Gait Pattern: Step-to pattern;Decreased stride length;Decreased hip/knee flexion - right;Decreased hip/knee flexion - left Gait velocity: slow Stairs: No    Shoulder Instructions     Exercises     PT Diagnosis: Difficulty walking;Acute pain  PT Problem List: Decreased activity tolerance;Decreased mobility;Decreased safety awareness;Decreased knowledge of precautions;Decreased knowledge of use of DME;Pain PT Treatment Interventions: DME instruction;Gait training;Stair training;Functional mobility training;Therapeutic activities;Patient/family education   PT Goals Acute Rehab PT Goals PT Goal Formulation: With patient/family Time For Goal Achievement: 04/14/12 Potential to Achieve Goals: Good Pt will go Supine/Side to Sit: with modified independence PT Goal: Supine/Side to Sit - Progress: Goal set today Pt will go Sit to Supine/Side: with modified independence PT Goal: Sit to Supine/Side - Progress: Goal set today Pt will go Sit to Stand: with modified independence PT Goal: Sit to Stand - Progress: Goal set today Pt will go Stand to Sit: with modified independence PT Goal: Stand to Sit - Progress: Goal set today Pt will Transfer Bed to Chair/Chair to Bed: with modified independence PT Transfer Goal: Bed to Chair/Chair to Bed - Progress: Goal set today Pt will Ambulate: >150 feet;with modified independence;with least restrictive assistive device PT Goal: Ambulate - Progress: Goal set today  Visit Information  Last PT Received On: 04/07/12 Assistance Needed: +1    Subjective Data  Patient Stated Goal: to go home   Prior Functioning  Home Living Lives With: Alone Available Help at Discharge: Family;Available 24 hours/day Type of Home: Apartment Home Access: Level entry Home Layout: One level Bathroom Shower/Tub:  Tub/shower unit;Curtain Bathroom Toilet:  Standard Home Adaptive Equipment: Bedside commode/3-in-1;Walker - rolling;Shower chair with back Prior Function Level of Independence: Independent Driving: Yes Vocation: Retired Musician: No difficulties Dominant Hand: Right    Cognition  Overall Cognitive Status: Impaired Area of Impairment: Attention Arousal/Alertness: Awake/alert Orientation Level: Appears intact for tasks assessed Behavior During Session: [facility] for tasks performed Current Attention Level: Sustained Attention - Other Comments: Pt gets easily distracted. Pt focused on finding her shoes in her bed and trying to pull all blankets off her bed to find shoes despite being told multiple times by therapist and daughter that shoes were not in pt's bed.    Extremity/Trunk Assessment Right Upper Extremity Assessment RUE ROM/Strength/Tone: [facility] for tasks assessed Left Upper Extremity Assessment LUE ROM/Strength/Tone: WFL for tasks assessed   Balance    End of Session PT - End of Session Equipment Utilized During Treatment: Gait belt;Back brace Activity Tolerance: Patient tolerated treatment well Patient left: in chair;with call bell/phone within reach;with family/visitor present Nurse Communication: Mobility status  GP     Milana Kidney 04/07/2012, 9:06 AM  04/07/2012 Milana Kidney DPT PAGER: [phone] OFFICE: [phone]

## 2012-04-07 NOTE — Progress Notes (Signed)
Subjective: Patient reports She's feeling okay from a back pain no leg pain  Objective: Vital signs in last 24 hours: Temp:  [97.6 F (36.4 C)-99.3 F (37.4 C)] 99.3 F (37.4 C) (01/04 0559) Pulse Rate:  [79-96] 88  (01/04 0559) Resp:  [9-20] 20  (01/04 0559) BP: (118-173)/(60-98) 136/80 mmHg (01/04 0559) SpO2:  [91 %-100 %] 91 % (01/04 0559)  Intake/Output from previous day: 01/03 0701 - 01/04 0700 In: 1700 [I.V.:1700] Out: 3900 [Urine:3685; Drains:15; Blood:200] Intake/Output this shift:    Strength out of 5 wound clean and dry  Lab Results: No results found for this basename: WBC:2,HGB:2,HCT:2,PLT:2 in the last 72 hours BMET No results found for this basename: NA:2,K:2,CL:2,CO2:2,GLUCOSE:2,BUN:2,CREATININE:2,CALCIUM:2 in the last 72 hours  Studies/Results: Dg Lumbar Spine 2-3 Views  04/06/2012  *RADIOLOGY REPORT*  Clinical Data: L4-5 PLIF  LUMBAR SPINE - 2-3 VIEW  Comparison: 03/21/2012  Findings: Two views show discectomy at L4-5 with interbody fusion material.  There is posterior decompression with bilateral pedicle screws and posterior rods.  IMPRESSION: PLIF L4-5.   Original Report Authenticated By: Paulina Fusi, M.D.     Assessment/Plan: Patient's knee immobilizer now postop day 1 from a lumbar fusion patient will need an additional 24 hours to work with physical therapy before she'll be ready for discharge.  LOS: 1 day     Marlon Vonruden P 04/07/2012, 9:33 AM

## 2012-04-07 NOTE — Evaluation (Signed)
Occupational Therapy Evaluation Patient Details Name: Felicia Little MRN: [id number] DOB: 06/18/1945 Today's Date: 04/07/2012 Time: 7829-5621 OT Time Calculation (min): 30 min  OT Assessment / Plan / Recommendation Clinical Impression  Pt s/p posterior lumbar fusion thus affecting PLOF. Will benefit from acute OT services to address below problem list in prep for d/c home.    OT Assessment  Patient needs continued OT Services    Follow Up Recommendations  No OT follow up;Supervision/Assistance - 24 hour    Barriers to Discharge None    Equipment Recommendations  None recommended by OT    Recommendations for Other Services    Frequency  Min 2X/week    Precautions / Restrictions Precautions Precautions: Back Precaution Booklet Issued: Yes (comment) Precaution Comments: Educated pt and daughter on 3/3 back precautions. Required Braces or Orthoses: Spinal Brace Spinal Brace: Lumbar corset Restrictions Weight Bearing Restrictions: No   Pertinent Vitals/Pain See vitals    ADL  Eating/Feeding: Performed;Independent Where Assessed - Eating/Feeding: Chair Upper Body Dressing: Performed;Set up Where Assessed - Upper Body Dressing: Unsupported sitting Lower Body Dressing: Performed;Maximal assistance Where Assessed - Lower Body Dressing: Supported sit to stand Toilet Transfer: Simulated;Minimal assistance Toilet Transfer Method: Sit to Barista: Other (comment) (bed) Equipment Used: Gait belt;Back brace;Rolling walker Transfers/Ambulation Related to ADLs: min assist with RW for ambulation.  VC to avoid twisting and for safe use of RW. ADL Comments: Pt donned back brace with min assist. Pt unable to cross ankles over knees for LB dressing/bathing tasks (reports she was unable to do this prior to sx).      OT Diagnosis: Acute pain  OT Problem List: Decreased activity tolerance;Decreased knowledge of use of DME or AE;Decreased knowledge of precautions;Pain OT  Treatment Interventions: Self-care/ADL training;DME and/or AE instruction;Therapeutic activities;Patient/family education   OT Goals Acute Rehab OT Goals OT Goal Formulation: With patient Time For Goal Achievement: 04/14/12 Potential to Achieve Goals: Good ADL Goals Pt Will Perform Grooming: with supervision;Standing at sink ADL Goal: Grooming - Progress: Goal set today Pt Will Perform Lower Body Dressing: Sit to stand from chair;Sit to stand from bed;with adaptive equipment;with min assist (with family independent in assisting) ADL Goal: Lower Body Dressing - Progress: Goal set today Pt Will Transfer to Toilet: with supervision;Ambulation;with DME;Comfort height toilet;Maintaining back safety precautions ADL Goal: Toilet Transfer - Progress: Goal set today Pt Will Perform Toileting - Clothing Manipulation: with supervision;Standing;Sitting on 3-in-1 or toilet ADL Goal: Toileting - Clothing Manipulation - Progress: Goal set today Pt Will Perform Toileting - Hygiene: with supervision;Sit to stand from 3-in-1/toilet ADL Goal: Toileting - Hygiene - Progress: Goal set today Pt Will Perform Tub/Shower Transfer: Tub transfer;with min assist;Ambulation;with DME;Shower seat with back;Maintaining back safety precautions ADL Goal: Tub/Shower Transfer - Progress: Goal set today Miscellaneous OT Goals Miscellaneous OT Goal #1: Pt will independently maintain 3/3 back precautions during all ADLs. OT Goal: Miscellaneous Goal #1 - Progress: Goal set today Miscellaneous OT Goal #2: Pt will perform bed mobility with mod I in prep for EOB ADLs. OT Goal: Miscellaneous Goal #2 - Progress: Goal set today  Visit Information  Last OT Received On: 04/07/12 Assistance Needed: +1    Subjective Data      Prior Functioning     Home Living Lives With: Alone Available Help at Discharge: Family;Available 24 hours/day Type of Home: Apartment Home Access: Level entry Home Layout: One level Bathroom  Shower/Tub: Tub/shower unit;Curtain Bathroom Toilet: Standard Home Adaptive Equipment: Bedside commode/3-in-1;Walker - rolling;Shower chair with back  Prior Function Level of Independence: Independent Driving: Yes Vocation: Retired Musician: No difficulties Dominant Hand: Right         Vision/Perception     Cognition  Overall Cognitive Status: Impaired Area of Impairment: Attention Arousal/Alertness: Awake/alert Orientation Level: Appears intact for tasks assessed Behavior During Session: Felicia Little for tasks performed Current Attention Level: Sustained Attention - Other Comments: Pt gets easily distracted. Pt focused on finding her shoes in her bed and trying to pull all blankets off her bed to find shoes despite being told multiple times by therapist and daughter that shoes were not in pt's bed.    Extremity/Trunk Assessment Right Upper Extremity Assessment RUE ROM/Strength/Tone: WFL for tasks assessed Left Upper Extremity Assessment LUE ROM/Strength/Tone: WFL for tasks assessed     Mobility Bed Mobility Bed Mobility: Rolling Left;Left Sidelying to Sit;Sitting - Scoot to Edge of Bed Rolling Left: 4: Min assist;With rail Left Sidelying to Sit: 3: Mod assist;HOB flat;With rails Sitting - Scoot to Edge of Bed: 4: Min guard Details for Bed Mobility Assistance: VC for sequencing and log roll technique.  Assist to bring trunk up OOB. Transfers Transfers: Sit to Stand;Stand to Sit Sit to Stand: 4: Min assist;From bed;With upper extremity assist Stand to Sit: 4: Min assist;To chair/3-in-1;With armrests Details for Transfer Assistance: Assist for powerup from  bed and for steadying.  VC for safe hand placement.     Shoulder Instructions     Exercise     Balance     End of Session OT - End of Session Equipment Utilized During Treatment: Gait belt;Back brace Activity Tolerance: Patient tolerated treatment well Patient left: in chair;with call bell/phone within  reach;with family/visitor present Nurse Communication: Mobility status  GO   04/07/2012 [name] OTR/L Pager [phone] Office [phone]   [name] 04/07/2012, 9:08 AM

## 2012-04-08 MED ORDER — OXYCODONE HCL 5 MG PO TABS
15.0000 mg | ORAL_TABLET | ORAL | Status: DC | PRN
  Administered 2012-04-08 – 2012-04-09 (×8): 15 mg via ORAL
  Administered 2012-04-10 – 2012-04-11 (×2): 10 mg via ORAL
  Administered 2012-04-11: 15 mg via ORAL
  Administered 2012-04-11: 10 mg via ORAL
  Administered 2012-04-11 – 2012-04-14 (×14): 15 mg via ORAL
  Filled 2012-04-08: qty 3
  Filled 2012-04-08: qty 2
  Filled 2012-04-08 (×10): qty 3
  Filled 2012-04-08: qty 1
  Filled 2012-04-08: qty 2
  Filled 2012-04-08 (×7): qty 3
  Filled 2012-04-08: qty 2
  Filled 2012-04-08: qty 3
  Filled 2012-04-08: qty 1
  Filled 2012-04-08: qty 2
  Filled 2012-04-08 (×3): qty 3

## 2012-04-08 MED ORDER — ALUM & MAG HYDROXIDE-SIMETH 200-200-20 MG/5ML PO SUSP
30.0000 mL | ORAL | Status: DC | PRN
  Administered 2012-04-08 – 2012-04-13 (×3): 30 mL via ORAL
  Filled 2012-04-08 (×3): qty 30

## 2012-04-08 MED ORDER — IBUPROFEN 400 MG PO TABS
400.0000 mg | ORAL_TABLET | ORAL | Status: DC | PRN
  Administered 2012-04-08 – 2012-04-16 (×11): 400 mg via ORAL
  Filled 2012-04-08 (×13): qty 1

## 2012-04-08 NOTE — Progress Notes (Signed)
Subjective: Patient reports Fever last night to 102.7, drain came out and wound has been saturated with dressing being reinforced. Patient complains of significant pain also.  Objective: Vital signs in last 24 hours: Temp:  [97.9 F (36.6 C)-102.7 F (39.3 C)] 97.9 F (36.6 C) (01/05 4098) Pulse Rate:  [83-104] 83  (01/05 0633) Resp:  [18-20] 20  (01/05 1191) BP: (121-190)/(55-87) 121/55 mmHg (01/05 0633) SpO2:  [94 %-99 %] 98 % (01/05 4782)  Intake/Output from previous day: 01/04 0701 - 01/05 0700 In: -  Out: 100 [Drains:100] Intake/Output this shift:    Dressing removed from with notable maceration around incision. Steri-Strips largely that remove themselves with removal of the dressing. Motor function appears intact. No active seepage noted at this time from incision.  Lab Results: No results found for this basename: WBC:2,HGB:2,HCT:2,PLT:2 in the last 72 hours BMET No results found for this basename: NA:2,K:2,CL:2,CO2:2,GLUCOSE:2,BUN:2,CREATININE:2,CALCIUM:2 in the last 72 hours  Studies/Results: Dg Lumbar Spine 2-3 Views  04/06/2012  *RADIOLOGY REPORT*  Clinical Data: L4-5 PLIF  LUMBAR SPINE - 2-3 VIEW  Comparison: 03/21/2012  Findings: Two views show discectomy at L4-5 with interbody fusion material.  There is posterior decompression with bilateral pedicle screws and posterior rods.  IMPRESSION: PLIF L4-5.   Original Report Authenticated By: Paulina Fusi, M.D.     Assessment/Plan: Fever postop to 1027.  LOS: 2 days  Change dressing and paint incision with Betadine. We'll keep covered and monitor drainage from incision. Motor function appears intact. Continue to encourage mobilization.   Samik Balkcom J 04/08/2012, 9:42 AM

## 2012-04-08 NOTE — Progress Notes (Signed)
1645 Pt c/o of indigestion. 1705 Spoke to Dr. Danielle Dess, informed that pt was c/o of indigestion, new orders given for Mylanta 30 ml Q 4 hours PRN.  Will continue to monitor. A. Loletta Harper, RN

## 2012-04-08 NOTE — Progress Notes (Signed)
Occupational Therapy Treatment Patient Details Name: Felicia Little MRN: [id number] DOB: 1945/08/26 Today's Date: 04/08/2012 Time: 0272-5366 OT Time Calculation (min): 13 min  OT Assessment / Plan / Recommendation Comments on Treatment Session Educated pt on AE, but pt could use reinforcement.  Will plan to practice tub transfer prior to d/c home.    Follow Up Recommendations  No OT follow up;Supervision/Assistance - 24 hour    Barriers to Discharge       Equipment Recommendations  None recommended by OT    Recommendations for Other Services    Frequency Min 2X/week   Plan Discharge plan remains appropriate    Precautions / Restrictions Precautions Precautions: Back Precaution Booklet Issued: Yes (comment) Precaution Comments: pt able to verbalize 3/3 back precautions Required Braces or Orthoses: Spinal Brace Spinal Brace: Lumbar corset Restrictions Weight Bearing Restrictions: No   Pertinent Vitals/Pain See vitals    ADL  Eating/Feeding: Performed;Independent Where Assessed - Eating/Feeding: Edge of bed Lower Body Bathing: Simulated;Minimal assistance Where Assessed - Lower Body Bathing: Unsupported sitting Lower Body Dressing: Performed;Minimal assistance Where Assessed - Lower Body Dressing: Unsupported sitting Equipment Used: Back brace;Reacher;Long-handled sponge ADL Comments: Educated pt on use of AE (including toilet aid) for LB ADLs. Pt reports she has a reacher but does not like it because it does not have a good grip.  Educated pt on availablility of AE including toilet aid. Pt had just finished with PT prior to OT arrival and requesting to return to bed rather than practice tub transfer.    OT Diagnosis:    OT Problem List:   OT Treatment Interventions:     OT Goals ADL Goals Pt Will Perform Lower Body Dressing: Sit to stand from chair;Sit to stand from bed;with adaptive equipment;with min assist ADL Goal: Lower Body Dressing - Progress: Progressing toward  goals Pt Will Perform Toileting - Hygiene: with supervision;Sit to stand from 3-in-1/toilet ADL Goal: Toileting - Hygiene - Progress: Progressing toward goals Miscellaneous OT Goals Miscellaneous OT Goal #1: Pt will independently maintain 3/3 back precautions during all ADLs. OT Goal: Miscellaneous Goal #1 - Progress: Progressing toward goals Miscellaneous OT Goal #2: Pt will perform bed mobility with mod I in prep for EOB ADLs. OT Goal: Miscellaneous Goal #2 - Progress: Progressing toward goals  Visit Information  Last OT Received On: 04/08/12 Assistance Needed: +1    Subjective Data      Prior Functioning       Cognition  Overall Cognitive Status: Appears within functional limits for tasks assessed/performed Arousal/Alertness: Awake/alert Orientation Level: Appears intact for tasks assessed Behavior During Session: [name] for tasks performed Cognition - Other Comments: Increased attention span this session    Mobility  Shoulder Instructions Bed Mobility Bed Mobility: Sit to Sidelying Left Sit to Sidelying Left: 4: Min assist;HOB flat Details for Bed Mobility Assistance: Assist to support LEs into bed and to avoid twisting. Transfers Transfers: Not assessed (Pt sitting EOB upon arrival) Sit to Stand: 5: Supervision;With upper extremity assist;From chair/3-in-1 Stand to Sit: 5: Supervision;With upper extremity assist;To bed Details for Transfer Assistance: VC for safe hand placement to/from RW       Exercises      Balance     End of Session OT - End of Session Equipment Utilized During Treatment: Back brace Activity Tolerance: Patient limited by pain;Patient limited by fatigue Patient left: in bed;with call bell/phone within reach;with family/visitor present  GO    04/08/2012 [name] OTR/L Pager [phone] Office [phone]  [name] 04/08/2012, 12:38 PM

## 2012-04-08 NOTE — Progress Notes (Signed)
Physical Therapy Treatment Patient Details Name: Felicia Little MRN: [id number] DOB: [birth year] Today's Date: 04/08/2012 Time: 4098-1191 PT Time Calculation (min): 15 min  PT Assessment / Plan / Recommendation Comments on Treatment Session  Pt moving well this session, supervision level for all mobility. Pt with high fever last night and will not be discharging home today. Plan to d/c home tomorrow     Follow Up Recommendations  No PT follow up;Supervision for mobility/OOB     Does the patient have the potential to tolerate intense rehabilitation     Barriers to Discharge        Equipment Recommendations  None recommended by PT    Recommendations for Other Services    Frequency Min 5X/week   Plan Discharge plan remains appropriate;Frequency remains appropriate    Precautions / Restrictions Precautions Precautions: Back Precaution Booklet Issued: Yes (comment) Precaution Comments: pt able to verbalize 3/3 back precautions Required Braces or Orthoses: Spinal Brace Spinal Brace: Lumbar corset Restrictions Weight Bearing Restrictions: No   Pertinent Vitals/Pain Pain 5/10. Pain meds given prior to session    Mobility  Bed Mobility Bed Mobility: Not assessed Transfers Transfers: Sit to Stand;Stand to Sit Sit to Stand: 5: Supervision;With upper extremity assist;From chair/3-in-1 Stand to Sit: 5: Supervision;With upper extremity assist;To bed Details for Transfer Assistance: VC for safe hand placement to/from RW Ambulation/Gait Ambulation/Gait Assistance: 5: Supervision Ambulation Distance (Feet): 100 Feet Assistive device: Rolling walker Ambulation/Gait Assistance Details: VC for close distance to RW as pt leaning forward and pushing RW far in front.  Gait Pattern: Step-to pattern;Decreased stride length;Decreased hip/knee flexion - right;Decreased hip/knee flexion - left Gait velocity: slow Stairs: No    Exercises     PT Diagnosis:    PT Problem List:   PT Treatment  Interventions:     PT Goals Acute Rehab PT Goals PT Goal: Sit to Stand - Progress: Progressing toward goal PT Goal: Stand to Sit - Progress: Progressing toward goal PT Transfer Goal: Bed to Chair/Chair to Bed - Progress: Progressing toward goal PT Goal: Ambulate - Progress: Progressing toward goal  Visit Information  Last PT Received On: 04/08/12 Assistance Needed: +1    Subjective Data      Cognition  Cognition - Other Comments: Increased attention span this session    Balance     End of Session PT - End of Session Equipment Utilized During Treatment: Gait belt;Back brace Activity Tolerance: Patient tolerated treatment well Patient left: with call bell/phone within reach;in bed;with family/visitor present (sitting at EOB) Nurse Communication: Mobility status   GP     Milana Kidney 04/08/2012, 10:55 AM

## 2012-04-08 NOTE — Progress Notes (Signed)
Patient fever is 102.8, called Dr. Jeanene Erb  and received orders for UA, BMP, and CBC. Tylenol was also given, will continue to monitor.

## 2012-04-09 LAB — BASIC METABOLIC PANEL
BUN: 9 mg/dL (ref 6–23)
CO2: 24 mEq/L (ref 19–32)
Calcium: 9.3 mg/dL (ref 8.4–10.5)
Chloride: 94 mEq/L — ABNORMAL LOW (ref 96–112)
Creatinine, Ser: 0.84 mg/dL (ref 0.50–1.10)

## 2012-04-09 LAB — CBC
HCT: 34.2 % — ABNORMAL LOW (ref 36.0–46.0)
MCH: 27.9 pg (ref 26.0–34.0)
MCV: 83 fL (ref 78.0–100.0)
Platelets: 294 10*3/uL (ref 150–400)
RBC: 4.12 MIL/uL (ref 3.87–5.11)
RDW: 13.2 % (ref 11.5–15.5)

## 2012-04-09 NOTE — Clinical Social Work Note (Signed)
Clinical Social Work   CSW received consult for SNF. CSW reviewed chart and discussed pt with RN during progression. PT is not recommending any follow up. RNCM is aware. CSW is signing off as no further needs identified. Please reconsult if a need arises prior to discharge.   Dede Query, MSW, Theresia Majors [phone]

## 2012-04-09 NOTE — Progress Notes (Signed)
Ambulated patient around unit with walker.  Pt c/o little pain. Per family request dressing was changed again with 4x4 gauze over incision. Old gauze had some serosanguineous drainage on it.  Pt less diaphoretic this afternoon.  Will continue to monitor.

## 2012-04-09 NOTE — Progress Notes (Signed)
Patient ID: Senaida Chilcote, female   DOB: 12-01-1945, 67 y.o.   MRN: [id number] Subjective: Patient reports feeling well  Objective: Vital signs in last 24 hours: Temp:  [98 F (36.7 C)-102.8 F (39.3 C)] 98.4 F (36.9 C) (01/06 0842) Pulse Rate:  [84-103] 88  (01/06 0842) Resp:  [17-20] 18  (01/06 0842) BP: (131-189)/(56-99) 132/60 mmHg (01/06 0842) SpO2:  [96 %-100 %] 100 % (01/06 0842) Weight:  [82.2 kg (181 lb 3.5 oz)] 82.2 kg (181 lb 3.5 oz) (01/06 0644)  Intake/Output from previous day:   Intake/Output this shift:    Wound:some serous drainage. Wound dressing changed  Lab Results:  Basename 04/09/12  WBC 17.3*  HGB 11.5*  HCT 34.2*  PLT 294   BMET  Basename 04/09/12  NA 129*  K 3.6  CL 94*  CO2 24  GLUCOSE 132*  BUN 9  CREATININE 0.84  CALCIUM 9.3    Studies/Results: No results found.  Assessment/Plan: She looks good. Running a low grade temp now and then. Will use incentive spiro today, and d/c tomorrow if fever down.  LOS: 3 days  as above   Reinaldo Meeker, MD 04/09/2012, 9:07 AM

## 2012-04-09 NOTE — Progress Notes (Signed)
Physical Therapy Treatment Patient Details Name: Felicia Little MRN: [id number] DOB: 1945-11-11 Today's Date: 04/09/2012 Time: 4098-1191 PT Time Calculation (min): 19 min  PT Assessment / Plan / Recommendation Comments on Treatment Session  Pt with report "I feel a little weaker.". most likely due to having a fever last night. Patient remains in good spirits. Pt to con't to require 24/7 supervision upon d/c. Will trial 1 step tomorrow AM prior to potential d/c. Pt politely deferred due to fatigue post using bathroom and ambulation.    Follow Up Recommendations  No PT follow up;Supervision for mobility/OOB     Does the patient have the potential to tolerate intense rehabilitation     Barriers to Discharge        Equipment Recommendations  None recommended by PT    Recommendations for Other Services    Frequency Min 5X/week   Plan Discharge plan remains appropriate;Frequency remains appropriate    Precautions / Restrictions Precautions Precautions: Back Precaution Booklet Issued: Yes (comment) Precaution Comments: pt able to verbalize 3/3 back precautions Required Braces or Orthoses: Spinal Brace Spinal Brace: Lumbar corset Restrictions Weight Bearing Restrictions: No   Pertinent Vitals/Pain 3/10 surgical back pain    Mobility  Bed Mobility Bed Mobility: Not assessed Transfers Transfers: Sit to Stand;Stand to Sit Sit to Stand: 4: Min guard;With upper extremity assist;From bed Stand to Sit: 4: Min guard;Without upper extremity assist;To chair/3-in-1;With armrests Details for Transfer Assistance: v/c's for hand placement, increased effort Ambulation/Gait Ambulation/Gait Assistance: 4: Min guard Ambulation Distance (Feet): 150 Feet Assistive device: Rolling walker Ambulation/Gait Assistance Details: pt easily distracted and would vear left/right however when focused on task pt demo'd safe walker management Gait Pattern: Step-to pattern;Decreased stride length Gait velocity:  slow Stairs: No    Exercises     PT Diagnosis:    PT Problem List:   PT Treatment Interventions:     PT Goals Acute Rehab PT Goals PT Goal: Sit to Stand - Progress: Progressing toward goal PT Goal: Stand to Sit - Progress: Progressing toward goal PT Goal: Ambulate - Progress: Progressing toward goal  Visit Information  Last PT Received On: 04/09/12 Assistance Needed: +1    Subjective Data  Subjective: Pt received EOB with daughter with request to use restroom.   Cognition  Overall Cognitive Status: Appears within functional limits for tasks assessed/performed Arousal/Alertness: Awake/alert Orientation Level: Appears intact for tasks assessed Behavior During Session: [facility] for tasks performed Cognition - Other Comments: pt easily distracted    Balance     End of Session PT - End of Session Equipment Utilized During Treatment: Gait belt;Back brace Activity Tolerance: Patient tolerated treatment well Patient left: in chair;with call bell/phone within reach;with family/visitor present Nurse Communication: Mobility status (ambulate with patient 2 more times today)   GP     Marcene Brawn 04/09/2012, 11:04 AM  Lewis Shock, PT, DPT Pager #: [phone] Office #: [phone]

## 2012-04-10 ENCOUNTER — Inpatient Hospital Stay (HOSPITAL_COMMUNITY): Payer: Medicare Other

## 2012-04-10 DIAGNOSIS — J9819 Other pulmonary collapse: Secondary | ICD-10-CM | POA: Diagnosis not present

## 2012-04-10 LAB — URINE CULTURE: Colony Count: 40000

## 2012-04-10 LAB — URINALYSIS, ROUTINE W REFLEX MICROSCOPIC
Glucose, UA: NEGATIVE mg/dL
Ketones, ur: NEGATIVE mg/dL
Nitrite: POSITIVE — AB
Protein, ur: NEGATIVE mg/dL
Urobilinogen, UA: 0.2 mg/dL (ref 0.0–1.0)

## 2012-04-10 LAB — URINE MICROSCOPIC-ADD ON

## 2012-04-10 MED ORDER — SULFAMETHOXAZOLE-TRIMETHOPRIM 400-80 MG PO TABS
1.0000 | ORAL_TABLET | Freq: Two times a day (BID) | ORAL | Status: DC
  Administered 2012-04-10 – 2012-04-11 (×3): 1 via ORAL
  Filled 2012-04-10 (×5): qty 1

## 2012-04-10 MED ORDER — MAGNESIUM CITRATE PO SOLN
1.0000 | Freq: Once | ORAL | Status: DC
  Filled 2012-04-10: qty 296

## 2012-04-10 MED ORDER — BISACODYL 5 MG PO TBEC
5.0000 mg | DELAYED_RELEASE_TABLET | Freq: Every day | ORAL | Status: DC | PRN
  Administered 2012-04-10 – 2012-04-13 (×2): 5 mg via ORAL
  Filled 2012-04-10 (×2): qty 1

## 2012-04-10 NOTE — Progress Notes (Signed)
Dr Jordan Likes notified of temp 101 and family's concern re:  Increased confusion at hs and increased temp.  New orders received and family instructed to speak with rounding MD in am.  admininstered xanax 0.5mg  and motrin with stated pain relief.  Pt stated she takes xanax at home and has not had doses since admission.

## 2012-04-10 NOTE — Progress Notes (Signed)
Physical Therapy Treatment Patient Details Name: Felicia Little MRN: [id number] DOB: [birth year] Today's Date: 04/10/2012 Time: 4098-1191 PT Time Calculation (min): 19 min  PT Assessment / Plan / Recommendation Comments on Treatment Session  Decreased independence today due to pain worse after MD used dermabond to seal incision.  Feel daughters can provide adequate assist at home.  Needs to practice with real step if able (tried stairwell and was too cold)    Follow Up Recommendations  Home health PT;Supervision for mobility/OOB                 Equipment Recommendations  None recommended by PT        Frequency Min 5X/week   Plan Discharge plan remains appropriate    Precautions / Restrictions Precautions Precautions: Back Precaution Booklet Issued: Yes (comment) Precaution Comments: needs assist for proper positioning for back precautions Required Braces or Orthoses: Spinal Brace Spinal Brace: Lumbar corset;Applied in sitting position Restrictions Weight Bearing Restrictions: No   Pertinent Vitals/Pain "12/10" at incision site RN informed    Mobility  Bed Mobility Bed Mobility: Scooting to [facility] Rolling Left: 4: Min guard;With rail Left Sidelying to Sit: 5: Supervision;HOB elevated;With rails Sit to Supine: 4: Min guard;With rail Sit to Sidelying Left: 4: Min assist;HOB elevated;With rail Scooting to HOB: 1: +2 Total assist Scooting to [facility]: Patient Percentage: 30% Details for Bed Mobility Assistance: cues for proper technique for back precautions; assisted one leg in bed, to scoot to [facility] assisted daughter to scoot patient up on pad Transfers Sit to Stand: 4: Min guard;With upper extremity assist;From bed Stand to Sit: 4: Min guard;With upper extremity assist;To bed Details for Transfer Assistance: initial difficulty using hands on bed for sit to stand, tried one hand on walker and one on bed, then pulled up with both hands on walker and PT stabilizing walker; c/o pain in sides  with transition.  Reminded patient safety issues with pulling up on walker. Ambulation/Gait Ambulation/Gait Assistance: 5: Supervision Ambulation Distance (Feet): 100 Feet Assistive device: Rolling walker Ambulation/Gait Assistance Details: slow speed, decreased step length, decreased foot clearance, forward flexed Stairs: Yes Stairs Assistance: 4: Min assist;3: Mod assist Stairs Assistance Details (indicate cue type and reason): simulated one step at tile color transition.  Forwards first with assist to lift walker up, then backwards with education to utilize this technique if unable to lift up with right leg onto step due to unable to use UE's with walker at disadvantage Stair Management Technique: With walker;Forwards;Backwards Number of Stairs: 1      PT Goals Acute Rehab PT Goals Pt will go Supine/Side to Sit: with modified independence PT Goal: Supine/Side to Sit - Progress: Progressing toward goal Pt will go Sit to Supine/Side: with modified independence PT Goal: Sit to Supine/Side - Progress: Progressing toward goal Pt will go Sit to Stand: with modified independence PT Goal: Sit to Stand - Progress: Progressing toward goal Pt will go Stand to Sit: with modified independence PT Goal: Stand to Sit - Progress: Progressing toward goal Pt will Ambulate: >150 feet;with modified independence;with least restrictive assistive device PT Goal: Ambulate - Progress: Progressing toward goal  Visit Information  Last PT Received On: 04/10/12    Subjective Data  Subjective: It's been hurting just like I had surgery since he glued it back together   Cognition  Overall Cognitive Status: Appears within functional limits for tasks assessed/performed Area of Impairment: Attention Arousal/Alertness: Awake/alert Orientation Level: Appears intact for tasks assessed Behavior During Session: Agitated Current Attention  Level: Sustained Attention - Other Comments: internal distraction with  pain Cognition - Other Comments: pt sleepy as she hasd just pain meds         End of Session PT - End of Session Equipment Utilized During Treatment: Back brace Activity Tolerance: Patient limited by pain Patient left: in bed;with call bell/phone within reach;with nursing in room;with family/visitor present   GP     [facility] 04/10/2012, 4:59 PM Sheran Lawless, PT [phone] 04/10/2012

## 2012-04-10 NOTE — Progress Notes (Signed)
Patient ID: Felicia Little, female   DOB: January 09, 1946, 67 y.o.   MRN: [id number] Subjective: Patient reports feeling good  Objective: Vital signs in last 24 hours: Temp:  [97.8 F (36.6 C)-102.5 F (39.2 C)] 98.6 F (37 C) (01/07 0934) Pulse Rate:  [68-91] 76  (01/07 0934) Resp:  [18-20] 18  (01/07 0934) BP: (94-140)/(45-75) 110/55 mmHg (01/07 0934) SpO2:  [96 %-100 %] 96 % (01/07 0934)  Intake/Output from previous day: 01/06 0701 - 01/07 0700 In: 640 [P.O.:640] Out: 400 [Urine:400] Intake/Output this shift:    Wound:some serous drainage. Reinforced with dermabond and steri strips  Lab Results:  Basename 04/09/12  WBC 17.3*  HGB 11.5*  HCT 34.2*  PLT 294   BMET  Basename 04/09/12  NA 129*  K 3.6  CL 94*  CO2 24  GLUCOSE 132*  BUN 9  CREATININE 0.84  CALCIUM 9.3    Studies/Results: Dg Chest 2 View  04/10/2012  *RADIOLOGY REPORT*  Clinical Data: Fever, postop lumbar spine surgery  CHEST - 2 VIEW  Comparison: Chest x-ray of 03/30/2012  Findings: Only mild basilar atelectasis is present.  No pneumonia or effusion is seen.  Mild cardiomegaly is stable.  There are degenerative changes throughout the thoracic spine.  IMPRESSION: Minimal basilar atelectasis.  Stable mild cardiomegaly.   Original Report Authenticated By: Dwyane Dee, M.D.     Assessment/Plan: Doing well. Only issue is late night fevers that are not sustained. Will check her labs. Home as soon as fevers resolve. Also, has not had a BM . Will use laxative.  LOS: 4 days  as above   Reinaldo Meeker, MD 04/10/2012, 10:16 AM

## 2012-04-10 NOTE — Progress Notes (Signed)
Occupational Therapy Treatment Patient Details Name: Felicia Little MRN: [id number] DOB: 1945-09-16 Today's Date: 04/10/2012 Time: 0950-1002 OT Time Calculation (min): 12 min  OT Assessment / Plan / Recommendation Comments on Treatment Session      Follow Up Recommendations  Home health OT    Barriers to Discharge       Equipment Recommendations       Recommendations for Other Services    Frequency Min 2X/week   Plan Discharge plan remains appropriate    Precautions / Restrictions Precautions Precautions: Back Precaution Booklet Issued: Yes (comment) Precaution Comments: pt able to verbalize 3/3 back precautions Required Braces or Orthoses: Spinal Brace Spinal Brace: Lumbar corset Restrictions Weight Bearing Restrictions: No     OT Goals Miscellaneous OT Goals OT Goal: Miscellaneous Goal #2 - Progress: Progressing toward goals  Visit Information  Last OT Received On: 04/10/12    Subjective Data  Subjective: i had a fever last night         Mobility  Shoulder Instructions Bed Mobility Bed Mobility: Rolling Left;Supine to Sit;Sit to Supine Rolling Left: 4: Min assist;With rail Supine to Sit: 4: Min assist Sitting - Scoot to [place] of Bed: With rail;4: Min assist Sit to Supine: 4: Min assist;With rail Details for Bed Mobility Assistance: Upon sitting EOB MD came in and requested pt transition to prone position in bed for him to glue incision/  Pt mod a to transition to prone position Transfers Details for Transfer Assistance: OT will return to complete session  s/p incision glueing             End of Session OT - End of Session Activity Tolerance: Other (comment);Patient tolerated treatment well Patient left: in bed;Other (comment) (with MD present)  GO     Einar Crow D 04/10/2012, 10:40 AM

## 2012-04-10 NOTE — Progress Notes (Signed)
Occupational Therapy Treatment Patient Details Name: Felicia Little MRN: [id number] DOB: [birth year] Today's Date: 04/10/2012 Time: 2440-1027 OT Time Calculation (min): 29 min  OT Assessment / Plan / Recommendation Comments on Treatment Session Educated on AE.  Feel pt will need HHOT to address safety in the home, as well as tub in pts home    Follow Up Recommendations  Home health OT       Equipment Recommendations  Other (comment) (family will obtain AE)       Frequency Min 2X/week   Plan Discharge plan remains appropriate    Precautions / Restrictions Precautions Precautions: Back Precaution Booklet Issued: Yes (comment) Precaution Comments: pt able to verbalize 3/3 back precautions Required Braces or Orthoses: Spinal Brace Spinal Brace: Lumbar corset Restrictions Weight Bearing Restrictions: No       ADL  Lower Body Dressing: Performed;Minimal assistance Where Assessed - Lower Body Dressing: Unsupported standing;Other (comment) (AE education) ADL Comments: Focused on AE education for LB dressing.  Encouraged pt to obtain AE to increase I and maintain back precautions.  Pt did well with AE including sock aid and reacher      OT Goals ADL Goals ADL Goal: Lower Body Dressing - Progress: Progressing toward goals Miscellaneous OT Goals OT Goal: Miscellaneous Goal #1 - Progress: Met OT Goal: Miscellaneous Goal #2 - Progress: Progressing toward goals  Visit Information  Last OT Received On: 04/10/12    Subjective Data  Subjective: i had a fever last night      Cognition  Overall Cognitive Status: Appears within functional limits for tasks assessed/performed Arousal/Alertness: Awake/alert Orientation Level: Appears intact for tasks assessed Behavior During Session: [facility] for tasks performed Cognition - Other Comments: pt sleepy as she hasd just pain meds    Mobility  Shoulder Instructions Bed Mobility Bed Mobility: Supine to Sit Rolling Left: 4: Min assist;With  rail Left Sidelying to Sit: 5: Supervision;With rails Supine to Sit: 4: Min assist Sitting - Scoot to Edge of Bed: With rail;4: Min assist Sit to Supine: 4: Min guard;With rail Details for Bed Mobility Assistance: Upon sitting EOB MD came in and requested pt transition to prone position in bed for him to glue incision/  Pt mod a to transition to prone position Transfers Transfers: Sit to Stand;Stand to Sit Sit to Stand: 4: Min guard;With upper extremity assist;From bed Stand to Sit: 4: Min guard;With upper extremity assist;To chair/3-in-1 Details for Transfer Assistance: OT will return to complete session  s/p incision glueing             End of Session OT - End of Session Equipment Utilized During Treatment: Back brace Activity Tolerance: Patient tolerated treatment well Patient left: in chair;with call bell/phone within reach;with family/visitor present  GO     Alejandra Barna, [place] 04/10/2012, 1:38 PM

## 2012-04-11 ENCOUNTER — Encounter (HOSPITAL_COMMUNITY)
Admission: RE | Disposition: A | Discharge status: Home Health | Payer: Self-pay | Source: Ambulatory Visit | Attending: Neurosurgery

## 2012-04-11 ENCOUNTER — Inpatient Hospital Stay (HOSPITAL_COMMUNITY): Payer: Medicare Other | Admitting: *Deleted

## 2012-04-11 ENCOUNTER — Encounter (HOSPITAL_COMMUNITY): Payer: Self-pay | Admitting: *Deleted

## 2012-04-11 DIAGNOSIS — T8140XA Infection following a procedure, unspecified, initial encounter: Secondary | ICD-10-CM | POA: Diagnosis not present

## 2012-04-11 DIAGNOSIS — M549 Dorsalgia, unspecified: Secondary | ICD-10-CM | POA: Diagnosis not present

## 2012-04-11 DIAGNOSIS — G969 Disorder of central nervous system, unspecified: Secondary | ICD-10-CM | POA: Diagnosis not present

## 2012-04-11 HISTORY — PX: LUMBAR WOUND DEBRIDEMENT: SHX1988

## 2012-04-11 LAB — GRAM STAIN

## 2012-04-11 SURGERY — LUMBAR WOUND DEBRIDEMENT
Anesthesia: General | Site: Back | Wound class: Dirty or Infected

## 2012-04-11 MED ORDER — SODIUM CHLORIDE 0.9 % IJ SOLN
3.0000 mL | Freq: Two times a day (BID) | INTRAMUSCULAR | Status: DC
  Administered 2012-04-12 – 2012-04-14 (×3): 3 mL via INTRAVENOUS

## 2012-04-11 MED ORDER — ACETAMINOPHEN 650 MG RE SUPP: 650.0000 mg | RECTAL | Status: DC | PRN

## 2012-04-11 MED ORDER — ACETAMINOPHEN 325 MG PO TABS
650.0000 mg | ORAL_TABLET | ORAL | Status: DC | PRN
  Administered 2012-04-11 – 2012-04-12 (×2): 650 mg via ORAL
  Filled 2012-04-11 (×2): qty 2

## 2012-04-11 MED ORDER — ONDANSETRON HCL 4 MG/2ML IJ SOLN
INTRAMUSCULAR | Status: DC | PRN
  Administered 2012-04-11: 4 mg via INTRAVENOUS

## 2012-04-11 MED ORDER — ROCURONIUM BROMIDE 100 MG/10ML IV SOLN
INTRAVENOUS | Status: DC | PRN
  Administered 2012-04-11: 30 mg via INTRAVENOUS

## 2012-04-11 MED ORDER — PHENOL 1.4 % MT LIQD: 1.0000 | OROMUCOSAL | Status: DC | PRN

## 2012-04-11 MED ORDER — SENNA 8.6 MG PO TABS
1.0000 | ORAL_TABLET | Freq: Two times a day (BID) | ORAL | Status: DC
  Administered 2012-04-11 – 2012-04-15 (×8): 8.6 mg via ORAL
  Filled 2012-04-11 (×11): qty 1

## 2012-04-11 MED ORDER — LIDOCAINE HCL (CARDIAC) 20 MG/ML IV SOLN
INTRAVENOUS | Status: DC | PRN
  Administered 2012-04-11: 80 mg via INTRAVENOUS

## 2012-04-11 MED ORDER — HYDROMORPHONE HCL PF 1 MG/ML IJ SOLN
0.2500 mg | INTRAMUSCULAR | Status: DC | PRN
  Administered 2012-04-11 (×3): 0.5 mg via INTRAVENOUS

## 2012-04-11 MED ORDER — SUFENTANIL CITRATE 50 MCG/ML IV SOLN
INTRAVENOUS | Status: DC | PRN
  Administered 2012-04-11: 50 ug via INTRAVENOUS
  Administered 2012-04-11: 10 ug via INTRAVENOUS

## 2012-04-11 MED ORDER — SODIUM CHLORIDE 0.9 % IV SOLN: 250.0000 mL | INTRAVENOUS | Status: DC

## 2012-04-11 MED ORDER — GLYCOPYRROLATE 0.2 MG/ML IJ SOLN
INTRAMUSCULAR | Status: DC | PRN
  Administered 2012-04-11: .6 mg via INTRAVENOUS

## 2012-04-11 MED ORDER — PROPOFOL 10 MG/ML IV BOLUS
INTRAVENOUS | Status: DC | PRN
  Administered 2012-04-11: 200 mg via INTRAVENOUS

## 2012-04-11 MED ORDER — HEMOSTATIC AGENTS (NO CHARGE) OPTIME
TOPICAL | Status: DC | PRN
  Administered 2012-04-11: 1 via TOPICAL

## 2012-04-11 MED ORDER — MORPHINE SULFATE 2 MG/ML IJ SOLN
1.0000 mg | INTRAMUSCULAR | Status: DC | PRN
  Administered 2012-04-11 – 2012-04-12 (×4): 2 mg via INTRAVENOUS
  Administered 2012-04-12 (×2): 4 mg via INTRAVENOUS
  Administered 2012-04-12: 2 mg via INTRAVENOUS
  Administered 2012-04-13: 4 mg via INTRAVENOUS
  Administered 2012-04-13 – 2012-04-14 (×4): 2 mg via INTRAVENOUS
  Filled 2012-04-11: qty 1
  Filled 2012-04-11: qty 2
  Filled 2012-04-11 (×2): qty 1
  Filled 2012-04-11: qty 2
  Filled 2012-04-11: qty 1
  Filled 2012-04-11: qty 2
  Filled 2012-04-11 (×5): qty 1

## 2012-04-11 MED ORDER — ONDANSETRON HCL 4 MG/2ML IJ SOLN: 4.0000 mg | INTRAMUSCULAR | Status: DC | PRN

## 2012-04-11 MED ORDER — BACITRACIN 50000 UNITS IM SOLR
INTRAMUSCULAR | Status: DC | PRN
  Administered 2012-04-11: 13:00:00

## 2012-04-11 MED ORDER — VANCOMYCIN HCL 1000 MG IV SOLR
1000.0000 mg | INTRAVENOUS | Status: DC | PRN
  Administered 2012-04-11: 1000 mg via INTRAVENOUS

## 2012-04-11 MED ORDER — DEXTROSE 5 % IV SOLN
2.0000 g | INTRAVENOUS | Status: DC
  Administered 2012-04-11 – 2012-04-12 (×2): 2 g via INTRAVENOUS
  Filled 2012-04-11 (×4): qty 2

## 2012-04-11 MED ORDER — 0.9 % SODIUM CHLORIDE (POUR BTL) OPTIME
TOPICAL | Status: DC | PRN
  Administered 2012-04-11: 1000 mL

## 2012-04-11 MED ORDER — VANCOMYCIN HCL IN DEXTROSE 1-5 GM/200ML-% IV SOLN
1000.0000 mg | Freq: Two times a day (BID) | INTRAVENOUS | Status: DC
  Administered 2012-04-12 – 2012-04-13 (×4): 1000 mg via INTRAVENOUS
  Filled 2012-04-11 (×5): qty 200

## 2012-04-11 MED ORDER — ONDANSETRON HCL 4 MG/2ML IJ SOLN: 4.0000 mg | Freq: Once | INTRAMUSCULAR | Status: DC | PRN

## 2012-04-11 MED ORDER — THROMBIN 5000 UNITS EX SOLR
CUTANEOUS | Status: DC | PRN
  Administered 2012-04-11 (×2): 5000 [IU] via TOPICAL

## 2012-04-11 MED ORDER — POTASSIUM CHLORIDE IN NACL 20-0.9 MEQ/L-% IV SOLN
INTRAVENOUS | Status: DC
  Administered 2012-04-11 – 2012-04-13 (×4): via INTRAVENOUS
  Filled 2012-04-11 (×10): qty 1000

## 2012-04-11 MED ORDER — MENTHOL 3 MG MT LOZG: 1.0000 | LOZENGE | OROMUCOSAL | Status: DC | PRN

## 2012-04-11 MED ORDER — LACTATED RINGERS IV SOLN
INTRAVENOUS | Status: DC | PRN
  Administered 2012-04-11: 12:00:00 via INTRAVENOUS

## 2012-04-11 MED ORDER — SODIUM CHLORIDE 0.9 % IJ SOLN: 3.0000 mL | INTRAMUSCULAR | Status: DC | PRN

## 2012-04-11 MED ORDER — NEOSTIGMINE METHYLSULFATE 1 MG/ML IJ SOLN
INTRAMUSCULAR | Status: DC | PRN
  Administered 2012-04-11: 5 mg via INTRAVENOUS

## 2012-04-11 SURGICAL SUPPLY — 45 items
BAG DECANTER FOR FLEXI CONT (MISCELLANEOUS) ×2 IMPLANT
BENZOIN TINCTURE PRP APPL 2/3 (GAUZE/BANDAGES/DRESSINGS) IMPLANT
CANISTER SUCTION 2500CC (MISCELLANEOUS) ×2 IMPLANT
CLOTH BEACON ORANGE TIMEOUT ST (SAFETY) ×2 IMPLANT
DRAPE LAPAROTOMY 100X72X124 (DRAPES) ×2 IMPLANT
DRAPE POUCH INSTRU U-SHP 10X18 (DRAPES) ×2 IMPLANT
DRESSING TELFA 8X3 (GAUZE/BANDAGES/DRESSINGS) IMPLANT
DRSG OPSITE 4X5.5 SM (GAUZE/BANDAGES/DRESSINGS) IMPLANT
DURAPREP 26ML APPLICATOR (WOUND CARE) ×2 IMPLANT
DURAPREP 6ML APPLICATOR 50/CS (WOUND CARE) IMPLANT
DURASEAL SPINE SEALANT 3ML (MISCELLANEOUS) ×2 IMPLANT
ELECT REM PT RETURN 9FT ADLT (ELECTROSURGICAL) ×2
ELECTRODE REM PT RTRN 9FT ADLT (ELECTROSURGICAL) ×1 IMPLANT
EVACUATOR 3/16  PVC DRAIN (DRAIN)
EVACUATOR 3/16 PVC DRAIN (DRAIN) IMPLANT
GAUZE SPONGE 4X4 16PLY XRAY LF (GAUZE/BANDAGES/DRESSINGS) IMPLANT
GLOVE BIO SURGEON STRL SZ8 (GLOVE) ×2 IMPLANT
GLOVE BIOGEL PI IND STRL 7.5 (GLOVE) ×2 IMPLANT
GLOVE BIOGEL PI IND STRL 8 (GLOVE) ×1 IMPLANT
GLOVE BIOGEL PI INDICATOR 7.5 (GLOVE) ×2
GLOVE BIOGEL PI INDICATOR 8 (GLOVE) ×1
GLOVE ECLIPSE 7.5 STRL STRAW (GLOVE) ×6 IMPLANT
GOWN BRE IMP SLV AUR LG STRL (GOWN DISPOSABLE) IMPLANT
GOWN BRE IMP SLV AUR XL STRL (GOWN DISPOSABLE) ×2 IMPLANT
GOWN STRL REIN 2XL LVL4 (GOWN DISPOSABLE) ×4 IMPLANT
KIT BASIN OR (CUSTOM PROCEDURE TRAY) ×2 IMPLANT
KIT ROOM TURNOVER OR (KITS) ×2 IMPLANT
NEEDLE HYPO 18GX1.5 BLUNT FILL (NEEDLE) IMPLANT
NEEDLE HYPO 25X1 1.5 SAFETY (NEEDLE) ×2 IMPLANT
NEEDLE SPNL 20GX3.5 QUINCKE YW (NEEDLE) IMPLANT
NS IRRIG 1000ML POUR BTL (IV SOLUTION) ×2 IMPLANT
PACK LAMINECTOMY NEURO (CUSTOM PROCEDURE TRAY) ×2 IMPLANT
PAD ARMBOARD 7.5X6 YLW CONV (MISCELLANEOUS) ×6 IMPLANT
STRIP CLOSURE SKIN 1/2X4 (GAUZE/BANDAGES/DRESSINGS) IMPLANT
SUT VIC AB 0 CT1 18XCR BRD8 (SUTURE) ×1 IMPLANT
SUT VIC AB 0 CT1 8-18 (SUTURE) ×1
SUT VIC AB 2-0 CP2 18 (SUTURE) ×2 IMPLANT
SUT VIC AB 3-0 SH 8-18 (SUTURE) ×4 IMPLANT
SWAB CULTURE LIQ STUART DBL (MISCELLANEOUS) ×4 IMPLANT
SYR 20ML ECCENTRIC (SYRINGE) ×2 IMPLANT
SYR 3ML LL SCALE MARK (SYRINGE) IMPLANT
TOWEL OR 17X24 6PK STRL BLUE (TOWEL DISPOSABLE) ×2 IMPLANT
TOWEL OR 17X26 10 PK STRL BLUE (TOWEL DISPOSABLE) ×2 IMPLANT
TUBE ANAEROBIC SPECIMEN COL (MISCELLANEOUS) ×2 IMPLANT
WATER STERILE IRR 1000ML POUR (IV SOLUTION) ×2 IMPLANT

## 2012-04-11 NOTE — Anesthesia Postprocedure Evaluation (Signed)
  Anesthesia Post-op Note  Patient: Felicia Little  Procedure(s) Performed: Procedure(s) (LRB) with comments: LUMBAR WOUND DEBRIDEMENT (N/A) - Irrigation and debridement of lumbar wound, Repair of pseudomeningocele not requiring laminectomy  Patient Location: PACU  Anesthesia Type:General  Level of Consciousness: awake, alert , patient cooperative and confused  Airway and Oxygen Therapy: Patient Spontanous Breathing  Post-op Pain: mild  Post-op Assessment: Post-op Vital signs reviewed, Patient's Cardiovascular Status Stable, Respiratory Function Stable, Patent Airway, No signs of Nausea or vomiting and Pain level controlled  Post-op Vital Signs: Reviewed and stable  Complications: No apparent anesthesia complications

## 2012-04-11 NOTE — Progress Notes (Signed)
   CARE MANAGEMENT NOTE 04/11/2012  Patient:  Felicia Little,Felicia Little   Account Number:  [id number]  Date Initiated:  04/06/2012  Documentation initiated by:  [facility]  Subjective/Objective Assessment:   Admitted postop L4-5 PLIF     Action/Plan:   PT/OT evals-   Anticipated DC Date:  04/10/2011   Anticipated DC Plan:  HOME/SELF CARE      DC Planning Services  CM consult      [facility] Choice  HOME HEALTH   Choice offered to / List presented to:  C-1 Patient        HH arranged  HH-1 RN      [facility] agency  [facility]   Status of service:  In process, will continue to follow Medicare Important Message given?   (If response is "NO", the following Medicare IM given date fields will be blank) Date Medicare IM given:   Date Additional Medicare IM given:    Discharge Disposition:    Per UR Regulation:  Reviewed for med. necessity/level of care/duration of stay  If discussed at Long Length of Stay Meetings, dates discussed:    Comments:  04/12/2011 1640 NCM spoke to Arther Dames, [phone] and Deann, [phone]. Requested [facility] they did not have availability or unable to accept/confirm [facility] RN. Spoke to [place] and referral was set up with [facility]. Spoke to Elizebeth Koller rep for referral. Isidoro Donning RN CCM Case Mgmt phone [phone]  04/12/2011 1500 Pt went to surgery for debridement of surgical wound. NCM will continue to follow for possible IV abx at home. Provided pt with HH list. Isidoro Donning RN CCM Case Mgmt phone [phone]

## 2012-04-11 NOTE — Progress Notes (Signed)
Pt having drainage from incision, dressing reinforced. Pt temp 102.7, tylenol 650 given. On call Dr. Yetta Barre was notified, said he would come see the patient. Will continue to monitor.

## 2012-04-11 NOTE — Anesthesia Preprocedure Evaluation (Addendum)
Anesthesia Evaluation  Patient identified by MRN, date of birth, ID band Patient awake    Reviewed: Allergy & Precautions, H&P , NPO status , Patient's Chart, lab work & pertinent test results, reviewed documented beta blocker date and time   Airway Mallampati: II TM Distance: >3 FB Neck ROM: Full    Dental  (+) Poor Dentition and Dental Advisory Given   Pulmonary  Remote pneumonia         Cardiovascular hypertension, Pt. on medications     Neuro/Psych    GI/Hepatic   Endo/Other    Renal/GU      Musculoskeletal   Abdominal   Peds  Hematology   Anesthesia Other Findings   Reproductive/Obstetrics                           Anesthesia Physical Anesthesia Plan  ASA: II  Anesthesia Plan: General   Post-op Pain Management:    Induction: Intravenous  Airway Management Planned: Oral ETT  Additional Equipment:   Intra-op Plan:   Post-operative Plan: Extubation in OR  Informed Consent: I have reviewed the patients History and Physical, chart, labs and discussed the procedure including the risks, benefits and alternatives for the proposed anesthesia with the patient or authorized representative who has indicated his/her understanding and acceptance.   Dental advisory given  Plan Discussed with: CRNA, Anesthesiologist and Surgeon  Anesthesia Plan Comments:         Anesthesia Quick Evaluation

## 2012-04-11 NOTE — Transfer of Care (Signed)
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

## 2012-04-11 NOTE — Preoperative (Signed)
Beta Blockers   Reason not to administer Beta Blockers:Not Applicable 

## 2012-04-11 NOTE — Progress Notes (Signed)
Patient ID: Felicia Little, female   DOB: 1946-01-03, 67 y.o.   MRN: [id number] I was called to see the patient today for fevers and copious drainage from her wound. Her wound has a dehiscence and has copious amounts of exudate of drainage. She is complaining of back and left leg pain which started last night in today. She looks nontoxic. She seems to be moving her legs okay. I have recommended lumbar exploration with irrigation and debridement of the wound. I've explained that she has a wound infection and will likely need 4 weeks of IV antibiotics. Therefore a PICC line will be placed. Obtain intraoperative cultures. They understand the risk include bleeding infection nerve injury numbness weakness and possible need for further surgery. They wish to proceed. I have spoken at length with her daughters.

## 2012-04-11 NOTE — Progress Notes (Signed)
ANTIBIOTIC CONSULT NOTE - INITIAL  Pharmacy Consult for Vancomycin and Rocephin Indication: suspected lumbar wound infection  No Known Allergies  Patient Measurements: Height: 5\' 5"  (165.1 cm) Weight: 181 lb 3.5 oz (82.2 kg) IBW/kg (Calculated) : 57   Vital Signs: Temp: 98.5 F (36.9 C) (01/08 1515) Temp src: Oral (01/08 1546) BP: 161/78 mmHg (01/08 1546) Pulse Rate: 79  (01/08 1546) Intake/Output from previous day:   Intake/Output from this shift: Total I/O In: 700 [I.V.:700] Out: 75 [Blood:75]  Labs:  [facility] 04/09/12  WBC 17.3*  HGB 11.5*  PLT 294  LABCREA --  CREATININE 0.84   Estimated Creatinine Clearance: 67.9 ml/min (by C-G formula based on Cr of 0.84). No results found for this basename: VANCOTROUGH:2,VANCOPEAK:2,VANCORANDOM:2,GENTTROUGH:2,GENTPEAK:2,GENTRANDOM:2,TOBRATROUGH:2,TOBRAPEAK:2,TOBRARND:2,AMIKACINPEAK:2,AMIKACINTROU:2,AMIKACIN:2, in the last 72 hours   Microbiology: Recent Results (from the past 720 hour(s))  SURGICAL PCR SCREEN     Status: Normal   Collection Time   03/30/12  3:05 PM      Component Value Range Status Comment   MRSA, PCR NEGATIVE  NEGATIVE Final    Staphylococcus aureus NEGATIVE  NEGATIVE Final   URINE CULTURE     Status: Normal   Collection Time   04/08/12 10:57 PM      Component Value Range Status Comment   Specimen Description URINE, CLEAN CATCH   Final    Special Requests NONE   Final    Culture  Setup Time 04/08/2012 23:26   Final    Colony Count 40,000 COLONIES/ML   Final    Culture [facility]: Lambert Rhonda W MORGANII   Final    Report Status 04/10/2012 FINAL   Final    Organism ID, Bacteria MORGANELLA MORGANII   Final   URINE CULTURE     Status: Normal (Preliminary result)   Collection Time   04/10/12 12:17 AM      Component Value Range Status Comment   Specimen Description URINE, RANDOM   Final    Special Requests NONE   Final    Culture  Setup Time 04/10/2012 16:01   Final    Colony Count >=100,000 COLONIES/ML   Final    Culture GRAM NEGATIVE RODS   Final    Report Status PENDING   Incomplete   GRAM STAIN     Status: Normal   Collection Time   04/11/12 12:42 PM      Component Value Range Status Comment   Specimen Description WOUND BACK   Final    Special Requests LUMBAR WOUND NO 1 PT ON SEPTRA   Final    Gram Stain     Final    Value: ABUNDANT WBC PRESENT, PREDOMINANTLY PMN     NO ORGANISMS SEEN     called to Dr. Yetta Barre 04/11/12 [address]   Report Status 04/11/2012 FINAL   Final   GRAM STAIN     Status: Normal   Collection Time   04/11/12 12:44 PM      Component Value Range Status Comment   Specimen Description WOUND BACK   Final    Special Requests LUMBAR WOUND NO 2 PT ON SEPTRA   Final    Gram Stain     Final    Value: ABUNDANT WBC PRESENT, PREDOMINANTLY PMN     NO ORGANISMS SEEN     called to Dr. Yetta Barre 04/11/12 [address]   Report Status 04/11/2012 FINAL   Final     Medical History: Past Medical History  Diagnosis Date  . Hypertension   . Depression   .  Anxiety   . Pneumonia ~ 1995; 1996  . Headache     "maybe once/week" (04/06/2012)  . Arthritis     "all over" (04/06/2012)  . Chronic lower back pain     Medications:  Scheduled:    . atenolol  50 mg Oral Daily  . gabapentin  600 mg Oral QHS  . magnesium citrate  1 Bottle Oral Once  . nortriptyline  75 mg Oral QHS  . senna  1 tablet Oral BID  . sodium chloride  3 mL Intravenous Q12H  . sodium chloride  3 mL Intravenous Q12H  . triamterene-hydrochlorothiazide  1 each Oral Daily  . [DISCONTINUED] sulfamethoxazole-trimethoprim  1 tablet Oral Q12H   Assessment: 67 yr old female s/p I&D of lumbar wound and repair of pseudomeningocele on vancomycin and rocephin for possible lumbar wound infection.  Patient had fever today of 102.7 and wbc= 17.3.  Patient was on septra for UTI, septra now dced and 1/7 urine culture growing gram neg rods.  Vancomycin was 1000mg  IV was given today 1247.    Goal of Therapy:  Vancomycin trough level  15-20 mcg/ml  Plan:  Start Rocephin 2 grams IV q24hrs and Vancomycin 1000mg  IV q12hrs. F/u renal function, cultures and sensitivites, vanc trough levels.  Wendie Simmer, PharmD, BCPS Clinical Pharmacist  Pager: [phone]

## 2012-04-11 NOTE — Progress Notes (Signed)
PT Cancellation Note  Patient Details Name: Felicia Little MRN: [id number] DOB: December 09, 1945   Cancelled Treatment:     Session cancelled at this time due to increased pain & incision leaking per pt's daughter.   Will attempt back later today if time allows.      Verdell Face, [place] [phone] 04/11/2012

## 2012-04-11 NOTE — Op Note (Signed)
04/06/2012 - 04/11/2012  1:45 PM  PATIENT:  Felicia Little  67 y.o. female  PRE-OPERATIVE DIAGNOSIS:  Suspected lumbar wound infection  POST-OPERATIVE DIAGNOSIS:  Same with pseudomeningocele  PROCEDURE:  Lumbar reexploration for irrigation and debridement of lumbar wound followed by repair of pseudomeningocele not requiring laminectomy  SURGEON:  Marikay Alar, MD  ASSISTANTS: None  ANESTHESIA:   General  EBL: 50 ml  Total I/O In: 500 [I.V.:500] Out: -   BLOOD ADMINISTERED:none  DRAINS: None   SPECIMEN:  No Specimen  INDICATION FOR PROCEDURE: This patient underwent a pleasant by Dr. Gerlene Fee about 4 days ago and has had drainage from her wound the last couple days. It with Steri-Strips and Dermabond on the wound yesterday, but the wound continued to drain copious amounts of fluid. I thought she probably had a significant wound infection as the drainage looked more exudative, but the family explained to her earlier drainage that looked more like "pink lemonade." Recommended lumbar reexploration with irrigation and debridement of the wound. At the time of surgery I found a macerated hole in the lateral part of the left side of the dura requiring repair. Patient understood the risks, benefits, and alternatives and potential outcomes and wished to proceed.  PROCEDURE DETAILS: The patient was taken to the operating room and after induction of adequate generalized endotracheal anesthesia he was rolled in prone position on the Wilson frame all pressure points were padded. She had a obvious wound dehiscence. The lumbar region was prepped with DuraPrep and draped in usual sterile fashion. Old incision was opened down to the fascia and the initial cultures were taken. Then over the fascia and took some partial cultures. Then placed self-retaining retractors and then debrided the tissues again irrigated with more than a liter of bacitracin-containing solution. After the irrigation and debridement, place  2 large Hemovac drains, but is is beginning to close I noted that there was fluid welling up in the lateral recesses. It was fairly subtle. I then placed a retractor again and inspected the dura. I removed the Gelfoam embolic clot from the surface of the dura and found a macerated hole in the lateral edge of the dura adjacent to the disc space on the left. I therefore closed this primarily with a running 6-0 Prolene suture. I then checked with a Valsalva maneuver to look for any further leakage. I urinated once again, lined the dura with Gelfoam, removed the drains do not elicit further CSF leakage, and closed the muscle and the fascia with 0 Vicryl. I closed the subcutaneous tissue with 2-0 Vicryl in the subcuticular tissue with Vicryl. The skin was closed with benzoin Steri-Strips. A sterile dressing was applied and the patient was awakened from general anesthesia and transferred to the recovery room in stable condition. at the end of the procedure all sponge needle and instrument counts are correct.  PLAN OF CARE: Admit to inpatient   PATIENT DISPOSITION:  PACU - hemodynamically stable.   Delay start of Pharmacological VTE agent (>24hrs) due to surgical blood loss or risk of bleeding:  yes

## 2012-04-12 ENCOUNTER — Encounter (HOSPITAL_COMMUNITY): Payer: Self-pay | Admitting: Neurological Surgery

## 2012-04-12 LAB — URINE CULTURE: Colony Count: >=100000

## 2012-04-12 NOTE — Progress Notes (Signed)
Patient ID: Felicia Little, female   DOB: December 25, 1945, 67 y.o.   MRN: [id number] Subjective: Patient reports no headache. Back is appropriately sore. No leg pain  Objective: Vital signs in last 24 hours: Temp:  [98.3 F (36.8 C)-100.2 F (37.9 C)] 98.6 F (37 C) (01/09 1000) Pulse Rate:  [51-90] 77  (01/09 1000) Resp:  [17-21] 18  (01/09 1000) BP: (111-171)/(52-86) 121/53 mmHg (01/09 1000) SpO2:  [91 %-100 %] 96 % (01/09 1000)  Intake/Output from previous day: 01/08 0701 - 01/09 0700 In: 700 [I.V.:700] Out: 1225 [Urine:1150; Blood:75] Intake/Output this shift:    Neurologic: Grossly normal Dressing is dry  Lab Results: Lab Results  Component Value Date   WBC 17.3* 04/09/2012   HGB 11.5* 04/09/2012   HCT 34.2* 04/09/2012   MCV 83.0 04/09/2012   PLT 294 04/09/2012   No results found for this basename: INR, PROTIME   BMET Lab Results  Component Value Date   NA 129* 04/09/2012   K 3.6 04/09/2012   CL 94* 04/09/2012   CO2 24 04/09/2012   GLUCOSE 132* 04/09/2012   BUN 9 04/09/2012   CREATININE 0.84 04/09/2012   CALCIUM 9.3 04/09/2012    Studies/Results: No results found.  Assessment/Plan: Continue flat bedrest for 24 more hours. Seems to be doing okay   LOS: 6 days    JONES,DAVID S 04/12/2012, 12:22 PM

## 2012-04-12 NOTE — Progress Notes (Signed)
Patient was in a lot of pain when I came in. Patient daughter was not letting her take the full 15mg  of OxyIR. I explained to the daughter that her mom needed all of the pain med to help her feel better. Patient and daughter agreed to take full dose of pain meds. Patient sleep all with little complaints of pain.

## 2012-04-13 LAB — BASIC METABOLIC PANEL
BUN: 13 mg/dL (ref 6–23)
Calcium: 9.7 mg/dL (ref 8.4–10.5)
GFR calc non Af Amer: 59 mL/min — ABNORMAL LOW (ref >90)
Glucose, Bld: 113 mg/dL — ABNORMAL HIGH (ref 70–99)
Sodium: 134 mEq/L — ABNORMAL LOW (ref 135–145)

## 2012-04-13 LAB — WOUND CULTURE
Culture: NO GROWTH
Culture: NO GROWTH

## 2012-04-13 NOTE — Progress Notes (Signed)
Patient ID: Felicia Little, female   DOB: Oct 04, 1945, 67 y.o.   MRN: [id number] Patient looks good. No headache. No leg pain. Incision clean dry and intact. We'll mobilize today. Cultures negative, will stop antibiotics. Hopefully home soon.

## 2012-04-14 MED ORDER — CYCLOBENZAPRINE HCL 10 MG PO TABS
5.0000 mg | ORAL_TABLET | Freq: Three times a day (TID) | ORAL | Status: DC | PRN
  Administered 2012-04-14 – 2012-04-16 (×6): 5 mg via ORAL
  Filled 2012-04-14 (×7): qty 1

## 2012-04-14 MED ORDER — MORPHINE SULFATE 2 MG/ML IJ SOLN
1.0000 mg | INTRAMUSCULAR | Status: DC | PRN
  Administered 2012-04-14 – 2012-04-15 (×6): 1 mg via INTRAVENOUS
  Filled 2012-04-14 (×6): qty 1

## 2012-04-14 MED ORDER — OXYCODONE HCL 5 MG PO TABS
10.0000 mg | ORAL_TABLET | ORAL | Status: DC | PRN
  Administered 2012-04-14: 10 mg via ORAL
  Filled 2012-04-14: qty 2

## 2012-04-14 NOTE — Progress Notes (Addendum)
Pt has c/o new pain that is going from her back down her legs. Pt stated this started when she was taken off bed rest. Was given Oxy IR with little relief. Also the Oxy IR causes confusion. The pt's daughter states that his confusion with the pain med is worse at night. Will notify MD and continue to monitor.

## 2012-04-14 NOTE — Progress Notes (Signed)
Patient ID: Felicia Little, female   DOB: 08-Apr-1945, 67 y.o.   MRN: [id number] Wound flat, no drainage, no weakness. C/o incisional pain.spoke with family member. See orders

## 2012-04-15 NOTE — Progress Notes (Signed)
Patient ID: Felicia Little, female   DOB: 06-15-1945, 67 y.o.   MRN: [id number] Better, some incisional pain. Ambulating with help

## 2012-04-15 NOTE — Evaluation (Signed)
Physical Therapy Evaluation Patient Details Name: Felicia Little MRN: [id number] DOB: [birth year] Today's Date: 04/15/2012 Time: 4098-1191 PT Time Calculation (min): 20 min  PT Assessment / Plan / Recommendation Clinical Impression  Pt with L4-5 PLF on 1/3 requiring I&D for infected wound with 48 hr bedrest. Pt still presenting with decreased functional mobility and safety, although is moving well. Continue with plan, home soon with daughters.    PT Assessment  Patient needs continued PT services    Follow Up Recommendations  No PT follow up;Supervision for mobility/OOB    Does the patient have the potential to tolerate intense rehabilitation      Barriers to Discharge        Equipment Recommendations  None recommended by PT    Recommendations for Other Services     Frequency Min 5X/week    Precautions / Restrictions Precautions Precautions: Back Precaution Comments: pt able to recall 3/3 back precautions. Cues throughout for safety Required Braces or Orthoses: Spinal Brace Spinal Brace: Lumbar corset;Applied in sitting position Restrictions Weight Bearing Restrictions: No   Pertinent Vitals/Pain Pain 7/10. RN aware.       Mobility  Bed Mobility Bed Mobility: Not assessed Transfers Transfers: Sit to Stand;Stand to Sit Sit to Stand: 4: Min guard;With upper extremity assist;From chair/3-in-1 Stand to Sit: 4: Min guard;With upper extremity assist;To chair/3-in-1 Details for Transfer Assistance: VC for safe hand placement to/from RW as well as cues to avoid bending Ambulation/Gait Ambulation/Gait Assistance: 5: Supervision Ambulation Distance (Feet): 100 Feet Assistive device: Rolling walker Ambulation/Gait Assistance Details: cues throughout for safe distance to RW.  Gait Pattern: Step-to pattern;Decreased stride length Gait velocity: slow    Shoulder Instructions     Exercises     PT Diagnosis: Difficulty walking;Acute pain  PT Problem List: Decreased activity  tolerance;Decreased mobility;Decreased safety awareness;Decreased knowledge of precautions;Decreased knowledge of use of DME;Pain PT Treatment Interventions: DME instruction;Gait training;Stair training;Functional mobility training;Therapeutic activities;Patient/family education   PT Goals Acute Rehab PT Goals PT Goal Formulation: With patient/family Time For Goal Achievement: 04/22/12 Potential to Achieve Goals: Good Pt will go Supine/Side to Sit: with modified independence PT Goal: Supine/Side to Sit - Progress: Progressing toward goal Pt will go Sit to Supine/Side: with modified independence PT Goal: Sit to Supine/Side - Progress: Progressing toward goal Pt will go Sit to Stand: with modified independence PT Goal: Sit to Stand - Progress: Progressing toward goal Pt will go Stand to Sit: with modified independence PT Goal: Stand to Sit - Progress: Progressing toward goal Pt will Transfer Bed to Chair/Chair to Bed: with modified independence PT Transfer Goal: Bed to Chair/Chair to Bed - Progress: Progressing toward goal Pt will Ambulate: >150 feet;with modified independence;with least restrictive assistive device PT Goal: Ambulate - Progress: Progressing toward goal  Visit Information  Last PT Received On: 04/15/12 Assistance Needed: +1    Subjective Data  Patient Stated Goal: to go home soon   Prior Functioning  Home Living Lives With: Alone Available Help at Discharge: Family;Available 24 hours/day Type of Home: Apartment Home Access: Level entry Home Layout: One level Bathroom Shower/Tub: Tub/shower unit;Curtain Bathroom Toilet: Standard Home Adaptive Equipment: Bedside commode/3-in-1;Walker - rolling;Shower chair with back Prior Function Level of Independence: Independent Driving: Yes Vocation: Retired Musician: No difficulties Dominant Hand: Right    Cognition  Overall Cognitive Status: Appears within functional limits for tasks  assessed/performed Arousal/Alertness: Awake/alert Behavior During Session: [facility] for tasks performed    Extremity/Trunk Assessment Right Lower Extremity Assessment RLE ROM/Strength/Tone: Within functional levels  RLE Sensation: WFL - Light Touch Left Lower Extremity Assessment LLE ROM/Strength/Tone: Within functional levels LLE Sensation: WFL - Light Touch   Balance    End of Session PT - End of Session Equipment Utilized During Treatment: Back brace Activity Tolerance: Patient limited by pain Patient left: in bed;with call bell/phone within reach;with nursing in room;with family/visitor present Nurse Communication: Mobility status  GP     Milana Kidney 04/15/2012, 1:32 PM  04/15/2012 Milana Kidney DPT PAGER: [phone] OFFICE: [phone]

## 2012-04-16 LAB — ANAEROBIC CULTURE

## 2012-04-16 MED ORDER — TRAMADOL HCL 50 MG PO TABS: 50.0000 mg | ORAL_TABLET | Freq: Four times a day (QID) | ORAL | Status: DC | PRN

## 2012-04-16 NOTE — Care Management Note (Signed)
    Page 1 of 1   04/16/2012     2:44:59 PM   CARE MANAGEMENT NOTE 04/16/2012  Patient:  Felicia Little,Felicia Little   Account Number:  [id number]  Date Initiated:  04/06/2012  Documentation initiated by:  Jacquelynn Cree  Subjective/Objective Assessment:   Admitted postop L4-5 PLIF     Action/Plan:   PT/OT evals-no follow up needs due to patient's progress   Anticipated DC Date:  04/17/2011   Anticipated DC Plan:  HOME/SELF CARE      DC Planning Services  CM consult      Choice offered to / List presented to:             Status of service:  Completed, signed off Medicare Important Message given?   (If response is "NO", the following Medicare IM given date fields will be blank) Date Medicare IM given:   Date Additional Medicare IM given:    Discharge Disposition:  HOME/SELF CARE  Per UR Regulation:  Reviewed for med. necessity/level of care/duration of stay  If discussed at Long Length of Stay Meetings, dates discussed:    Comments:  04/16/12 Patient not going home with Iv antibiotics,no HHC needs at discharge. Jacquelynn Cree RN, BSN, CCM  04/12/2011 1640 NCM spoke to Arther Dames, [phone] and Susanne Borders, [phone]. Requested [facility] and [facility] they did not have availability or unable to accept/confirm [facility] RN. Spoke to [place] and referral was set up with [facility]. Spoke to Elizebeth Koller rep for referral. Isidoro Donning RN CCM Case Mgmt phone [phone]  04/12/2011 1500 Pt went to surgery for debridement of surgical wound. NCM will continue to follow for possible IV abx at home. Provided pt with HH list. Isidoro Donning RN CCM Case Mgmt phone [phone]

## 2012-04-16 NOTE — Discharge Summary (Signed)
Physician Discharge Summary  Patient ID: Felicia Little MRN: [id number] DOB/AGE: [birth year] 67 y.o.  Admit date: 04/06/2012 Discharge date: 04/16/2012  Admission Diagnoses:  Discharge Diagnoses:  Active Problems:  * No active hospital problems. *    Discharged Condition: good  Hospital Course: Surgery 10 days ago. Pain much better. Was increasing activity when began to have what looked like a wound infection. Taken back to OR where found to have csf leagae that was repaired. Kept flat for a few days, and then inctreased activity. Wound healed well. No further drainage. Home with specific instructions.  Consults: None  Significant Diagnostic Studies: none  Treatments: surgery: L45 PLIF: Repair  of csf fistula  Discharge Exam: Blood pressure 112/65, pulse 94, temperature 98.4 F (36.9 C), temperature source Oral, resp. rate 20, height 5\' 5"  (1.651 m), weight 82.2 kg (181 lb 3.5 oz), SpO2 98.00%. Incision/Wound:clean and dry  Disposition: Final discharge disposition not confirmed  Discharge Orders    Future Orders Please Complete By Expires   Diet general      Discharge instructions      Comments:   Mostly bedrest. Get up 9 or 10 times each day and walk for 15-20 minutes each time. Very little sitting the first week. No riding in the car until your first post op appointment. If you had neck surgery...may shower from the chest down. If you had low back surgery....you may shower with a saran wrap covering over the incision. Take your pain medicine as needed...and other medicines that you are instructed to take. Call for an appointment...[phone].   Call MD for:  temperature >100.4      Call MD for:  persistant nausea and vomiting      Call MD for:  severe uncontrolled pain      Call MD for:  redness, tenderness, or signs of infection (pain, swelling, redness, odor or green/yellow discharge around incision site)      Call MD for:  difficulty breathing, headache or visual disturbances       Call MD for:  hives          Medication List     As of 04/16/2012 12:28 PM    STOP taking these medications         traMADol-acetaminophen 37.5-325 MG per tablet   Commonly known as: ULTRACET      TAKE these medications         ALPRAZolam 0.5 MG tablet   Commonly known as: XANAX   Take 0.5 mg by mouth 2 (two) times daily as needed. For anxiety      atenolol 50 MG tablet   Commonly known as: TENORMIN   Take 50 mg by mouth daily.      gabapentin 300 MG capsule   Commonly known as: NEURONTIN   Take 600 mg by mouth at bedtime.      nortriptyline 75 MG capsule   Commonly known as: PAMELOR   Take 75 mg by mouth at bedtime.      traMADol 50 MG tablet   Commonly known as: ULTRAM   Take 1 tablet (50 mg total) by mouth every 6 (six) hours as needed for pain.      triamterene-hydrochlorothiazide 75-50 MG per tablet   Commonly known as: MAXZIDE   Take 0.5 tablets by mouth daily.         At home rest most of the time. Get up 9 or 10 times each day and take a 15 or 20 minute walk. No  riding in the car and to your first postoperative appointment. If you have neck surgery you may shower from the chest down starting on the third postoperative day. If you had back surgery he may start showering on the third postoperative day with saran wrap wrapped around your incisional area 3 times. After the shower remove the saran wrap. Take pain medicine as needed and other medications as instructed. Call my office for an appointment.  SignedReinaldo Meeker, MD 04/16/2012, 12:28 PM

## 2012-04-17 LAB — CULTURE, BLOOD (ROUTINE X 2)
Culture: NO GROWTH
Culture: NO GROWTH

## 2012-05-30 DIAGNOSIS — M4310 Spondylolisthesis, site unspecified: Secondary | ICD-10-CM | POA: Diagnosis not present

## 2012-06-19 DIAGNOSIS — IMO0002 Reserved for concepts with insufficient information to code with codable children: Secondary | ICD-10-CM | POA: Diagnosis not present

## 2012-06-19 DIAGNOSIS — I10 Essential (primary) hypertension: Secondary | ICD-10-CM | POA: Diagnosis not present

## 2012-06-27 DIAGNOSIS — M4310 Spondylolisthesis, site unspecified: Secondary | ICD-10-CM | POA: Diagnosis not present

## 2012-07-26 ENCOUNTER — Ambulatory Visit: Payer: Self-pay | Admitting: Family Medicine

## 2012-07-26 DIAGNOSIS — Z1231 Encounter for screening mammogram for malignant neoplasm of breast: Secondary | ICD-10-CM | POA: Diagnosis not present

## 2012-08-13 DIAGNOSIS — M48061 Spinal stenosis, lumbar region without neurogenic claudication: Secondary | ICD-10-CM | POA: Diagnosis not present

## 2012-08-13 DIAGNOSIS — Q762 Congenital spondylolisthesis: Secondary | ICD-10-CM | POA: Diagnosis not present

## 2012-10-16 DIAGNOSIS — E785 Hyperlipidemia, unspecified: Secondary | ICD-10-CM | POA: Diagnosis not present

## 2012-10-16 DIAGNOSIS — I10 Essential (primary) hypertension: Secondary | ICD-10-CM | POA: Diagnosis not present

## 2012-10-22 DIAGNOSIS — E785 Hyperlipidemia, unspecified: Secondary | ICD-10-CM | POA: Diagnosis not present

## 2012-10-22 DIAGNOSIS — M1990 Unspecified osteoarthritis, unspecified site: Secondary | ICD-10-CM | POA: Diagnosis not present

## 2012-10-22 DIAGNOSIS — F411 Generalized anxiety disorder: Secondary | ICD-10-CM | POA: Diagnosis not present

## 2012-10-22 DIAGNOSIS — I10 Essential (primary) hypertension: Secondary | ICD-10-CM | POA: Diagnosis not present

## 2012-11-30 DIAGNOSIS — E785 Hyperlipidemia, unspecified: Secondary | ICD-10-CM | POA: Diagnosis not present

## 2013-02-22 DIAGNOSIS — M1990 Unspecified osteoarthritis, unspecified site: Secondary | ICD-10-CM | POA: Diagnosis not present

## 2013-02-22 DIAGNOSIS — I10 Essential (primary) hypertension: Secondary | ICD-10-CM | POA: Diagnosis not present

## 2013-02-22 DIAGNOSIS — E785 Hyperlipidemia, unspecified: Secondary | ICD-10-CM | POA: Diagnosis not present

## 2013-02-22 DIAGNOSIS — F17200 Nicotine dependence, unspecified, uncomplicated: Secondary | ICD-10-CM | POA: Diagnosis not present

## 2013-04-12 DIAGNOSIS — M546 Pain in thoracic spine: Secondary | ICD-10-CM | POA: Diagnosis not present

## 2013-04-12 DIAGNOSIS — R071 Chest pain on breathing: Secondary | ICD-10-CM | POA: Diagnosis not present

## 2013-05-01 DIAGNOSIS — R062 Wheezing: Secondary | ICD-10-CM | POA: Diagnosis not present

## 2013-05-01 DIAGNOSIS — M546 Pain in thoracic spine: Secondary | ICD-10-CM | POA: Diagnosis not present

## 2013-06-24 ENCOUNTER — Ambulatory Visit: Payer: Self-pay | Admitting: Family Medicine

## 2013-06-24 DIAGNOSIS — R0989 Other specified symptoms and signs involving the circulatory and respiratory systems: Secondary | ICD-10-CM | POA: Diagnosis not present

## 2013-06-24 DIAGNOSIS — J189 Pneumonia, unspecified organism: Secondary | ICD-10-CM | POA: Diagnosis not present

## 2013-06-24 DIAGNOSIS — G894 Chronic pain syndrome: Secondary | ICD-10-CM | POA: Diagnosis not present

## 2013-06-24 DIAGNOSIS — F329 Major depressive disorder, single episode, unspecified: Secondary | ICD-10-CM | POA: Diagnosis not present

## 2013-06-24 DIAGNOSIS — IMO0002 Reserved for concepts with insufficient information to code with codable children: Secondary | ICD-10-CM | POA: Diagnosis not present

## 2013-06-24 DIAGNOSIS — F3289 Other specified depressive episodes: Secondary | ICD-10-CM | POA: Diagnosis not present

## 2013-06-24 DIAGNOSIS — F411 Generalized anxiety disorder: Secondary | ICD-10-CM | POA: Diagnosis not present

## 2013-07-22 DIAGNOSIS — M79609 Pain in unspecified limb: Secondary | ICD-10-CM | POA: Diagnosis not present

## 2013-07-22 DIAGNOSIS — M19019 Primary osteoarthritis, unspecified shoulder: Secondary | ICD-10-CM | POA: Diagnosis not present

## 2013-07-22 DIAGNOSIS — M25519 Pain in unspecified shoulder: Secondary | ICD-10-CM | POA: Diagnosis not present

## 2013-07-22 DIAGNOSIS — M19039 Primary osteoarthritis, unspecified wrist: Secondary | ICD-10-CM | POA: Diagnosis not present

## 2013-08-05 ENCOUNTER — Ambulatory Visit: Payer: Self-pay | Admitting: Family Medicine

## 2013-08-05 DIAGNOSIS — M25519 Pain in unspecified shoulder: Secondary | ICD-10-CM | POA: Diagnosis not present

## 2013-08-05 DIAGNOSIS — Z1231 Encounter for screening mammogram for malignant neoplasm of breast: Secondary | ICD-10-CM | POA: Diagnosis not present

## 2013-08-21 DIAGNOSIS — M19019 Primary osteoarthritis, unspecified shoulder: Secondary | ICD-10-CM | POA: Diagnosis not present

## 2013-08-21 DIAGNOSIS — M719 Bursopathy, unspecified: Secondary | ICD-10-CM | POA: Diagnosis not present

## 2013-08-21 DIAGNOSIS — M7581 Other shoulder lesions, right shoulder: Secondary | ICD-10-CM | POA: Insufficient documentation

## 2013-08-21 DIAGNOSIS — M778 Other enthesopathies, not elsewhere classified: Secondary | ICD-10-CM | POA: Insufficient documentation

## 2013-08-21 DIAGNOSIS — M67919 Unspecified disorder of synovium and tendon, unspecified shoulder: Secondary | ICD-10-CM | POA: Diagnosis not present

## 2013-09-02 DIAGNOSIS — I10 Essential (primary) hypertension: Secondary | ICD-10-CM | POA: Diagnosis not present

## 2013-09-02 DIAGNOSIS — S43429A Sprain of unspecified rotator cuff capsule, initial encounter: Secondary | ICD-10-CM | POA: Diagnosis not present

## 2013-09-02 DIAGNOSIS — G894 Chronic pain syndrome: Secondary | ICD-10-CM | POA: Diagnosis not present

## 2013-09-12 DIAGNOSIS — M25519 Pain in unspecified shoulder: Secondary | ICD-10-CM | POA: Diagnosis not present

## 2013-09-16 DIAGNOSIS — M25519 Pain in unspecified shoulder: Secondary | ICD-10-CM | POA: Diagnosis not present

## 2013-09-19 DIAGNOSIS — M25519 Pain in unspecified shoulder: Secondary | ICD-10-CM | POA: Diagnosis not present

## 2013-09-24 DIAGNOSIS — M25519 Pain in unspecified shoulder: Secondary | ICD-10-CM | POA: Diagnosis not present

## 2013-10-07 DIAGNOSIS — I10 Essential (primary) hypertension: Secondary | ICD-10-CM | POA: Diagnosis not present

## 2013-10-07 DIAGNOSIS — E785 Hyperlipidemia, unspecified: Secondary | ICD-10-CM | POA: Diagnosis not present

## 2013-10-07 DIAGNOSIS — Z23 Encounter for immunization: Secondary | ICD-10-CM | POA: Diagnosis not present

## 2013-10-07 DIAGNOSIS — F411 Generalized anxiety disorder: Secondary | ICD-10-CM | POA: Diagnosis not present

## 2013-10-09 ENCOUNTER — Encounter: Payer: Self-pay | Admitting: *Deleted

## 2013-10-09 ENCOUNTER — Encounter: Payer: Self-pay | Admitting: Podiatry

## 2013-10-09 ENCOUNTER — Ambulatory Visit (INDEPENDENT_AMBULATORY_CARE_PROVIDER_SITE_OTHER): Payer: Medicare Other | Admitting: Podiatry

## 2013-10-09 VITALS — BP 155/86 | HR 77 | Resp 16 | Ht 65.0 in | Wt 170.0 lb

## 2013-10-09 DIAGNOSIS — M2040 Other hammer toe(s) (acquired), unspecified foot: Secondary | ICD-10-CM

## 2013-10-09 DIAGNOSIS — L608 Other nail disorders: Secondary | ICD-10-CM | POA: Diagnosis not present

## 2013-10-09 DIAGNOSIS — B351 Tinea unguium: Secondary | ICD-10-CM | POA: Diagnosis not present

## 2013-10-09 NOTE — Progress Notes (Signed)
   Subjective:    Patient ID: Felicia Little, female    DOB: 07-Apr-1945, 68 y.o.   MRN: [id number]  HPI Comments: Its my right toenails. i dont know if circulation is getting to them. They have been discolored for 3 yrs. They are getting worse. [facility]'t [facility] anything. He just told me to get some cream. i cut my toenails.  Foot Pain      Review of Systems     Objective:   Physical Exam: I have reviewed her past medical history medications allergies surgeries social history and review of systems. Pulses are palpable bilateral. Neurologic sensorium is intact per Semmes-Weinstein monofilament. Muscle strength is normal bilateral. Orthopedic evaluation demonstrates hammertoe deformities right foot greater than left. Cutaneous evaluation demonstrates supple well hydrated cutis nails are thick yellow dystrophic possibly mycotic 1 through 5 of the right foot relatively normal in the left foot.        Assessment & Plan:  Assessment: Onychodystrophy with hammertoe deformities right foot.  Plan: Samples were taken of the nails today for histologic evaluation followup with her once her report comes in.

## 2013-10-09 NOTE — Progress Notes (Signed)
Nail specimens sent out to [facility] pathology.

## 2013-10-16 DIAGNOSIS — H26019 Infantile and juvenile cortical, lamellar, or zonular cataract, unspecified eye: Secondary | ICD-10-CM | POA: Diagnosis not present

## 2013-10-21 ENCOUNTER — Other Ambulatory Visit: Payer: Self-pay | Admitting: *Deleted

## 2013-10-21 MED ORDER — NUVAIL EX SOLN: CUTANEOUS | Status: DC

## 2013-10-21 NOTE — Telephone Encounter (Signed)
Called and spoke with pt letting her know pathology came back negative for fungus per dr Milinda Pointer. Sent rx for nuvail over to [facility] [address]. Pt understood.

## 2013-10-23 ENCOUNTER — Encounter: Payer: Self-pay | Admitting: Podiatry

## 2013-10-24 ENCOUNTER — Telehealth: Payer: Self-pay | Admitting: *Deleted

## 2013-10-24 NOTE — Telephone Encounter (Signed)
Called and left message for pt to call back to get results from [facility] pathology. Per dr Milinda Pointer negative for fungus, may try nuvail or have nail removed.

## 2013-10-31 DIAGNOSIS — M67919 Unspecified disorder of synovium and tendon, unspecified shoulder: Secondary | ICD-10-CM | POA: Diagnosis not present

## 2013-10-31 DIAGNOSIS — E785 Hyperlipidemia, unspecified: Secondary | ICD-10-CM | POA: Diagnosis not present

## 2013-10-31 DIAGNOSIS — M25519 Pain in unspecified shoulder: Secondary | ICD-10-CM | POA: Diagnosis not present

## 2013-11-07 ENCOUNTER — Ambulatory Visit: Payer: Self-pay | Admitting: Family Medicine

## 2013-11-07 DIAGNOSIS — M949 Disorder of cartilage, unspecified: Secondary | ICD-10-CM | POA: Diagnosis not present

## 2013-11-07 DIAGNOSIS — M899 Disorder of bone, unspecified: Secondary | ICD-10-CM | POA: Diagnosis not present

## 2013-11-07 DIAGNOSIS — M1990 Unspecified osteoarthritis, unspecified site: Secondary | ICD-10-CM | POA: Diagnosis not present

## 2013-11-22 ENCOUNTER — Ambulatory Visit: Payer: Self-pay | Admitting: Family Medicine

## 2013-11-22 DIAGNOSIS — M6688 Spontaneous rupture of other tendons, other: Secondary | ICD-10-CM | POA: Diagnosis not present

## 2013-11-22 DIAGNOSIS — S46819A Strain of other muscles, fascia and tendons at shoulder and upper arm level, unspecified arm, initial encounter: Secondary | ICD-10-CM | POA: Diagnosis not present

## 2013-11-22 DIAGNOSIS — M25519 Pain in unspecified shoulder: Secondary | ICD-10-CM | POA: Diagnosis not present

## 2013-12-02 DIAGNOSIS — M669 Spontaneous rupture of unspecified tendon: Secondary | ICD-10-CM | POA: Diagnosis not present

## 2013-12-03 DIAGNOSIS — M25519 Pain in unspecified shoulder: Secondary | ICD-10-CM | POA: Diagnosis not present

## 2013-12-03 DIAGNOSIS — M669 Spontaneous rupture of unspecified tendon: Secondary | ICD-10-CM | POA: Diagnosis not present

## 2014-01-02 DIAGNOSIS — M25511 Pain in right shoulder: Secondary | ICD-10-CM | POA: Diagnosis not present

## 2014-01-02 DIAGNOSIS — M669 Spontaneous rupture of unspecified tendon: Secondary | ICD-10-CM | POA: Diagnosis not present

## 2014-01-07 DIAGNOSIS — M25511 Pain in right shoulder: Secondary | ICD-10-CM | POA: Diagnosis not present

## 2014-01-07 DIAGNOSIS — M669 Spontaneous rupture of unspecified tendon: Secondary | ICD-10-CM | POA: Diagnosis not present

## 2014-01-10 DIAGNOSIS — M25511 Pain in right shoulder: Secondary | ICD-10-CM | POA: Diagnosis not present

## 2014-01-10 DIAGNOSIS — M669 Spontaneous rupture of unspecified tendon: Secondary | ICD-10-CM | POA: Diagnosis not present

## 2014-01-14 DIAGNOSIS — F411 Generalized anxiety disorder: Secondary | ICD-10-CM | POA: Diagnosis not present

## 2014-01-14 DIAGNOSIS — E785 Hyperlipidemia, unspecified: Secondary | ICD-10-CM | POA: Diagnosis not present

## 2014-01-14 DIAGNOSIS — M25511 Pain in right shoulder: Secondary | ICD-10-CM | POA: Diagnosis not present

## 2014-01-14 DIAGNOSIS — M5090 Cervical disc disorder, unspecified, unspecified cervical region: Secondary | ICD-10-CM | POA: Diagnosis not present

## 2014-01-14 DIAGNOSIS — F329 Major depressive disorder, single episode, unspecified: Secondary | ICD-10-CM | POA: Diagnosis not present

## 2014-01-14 DIAGNOSIS — I10 Essential (primary) hypertension: Secondary | ICD-10-CM | POA: Diagnosis not present

## 2014-01-14 DIAGNOSIS — M75121 Complete rotator cuff tear or rupture of right shoulder, not specified as traumatic: Secondary | ICD-10-CM | POA: Diagnosis not present

## 2014-01-14 DIAGNOSIS — Z23 Encounter for immunization: Secondary | ICD-10-CM | POA: Diagnosis not present

## 2014-01-16 DIAGNOSIS — M25511 Pain in right shoulder: Secondary | ICD-10-CM | POA: Diagnosis not present

## 2014-01-16 DIAGNOSIS — M669 Spontaneous rupture of unspecified tendon: Secondary | ICD-10-CM | POA: Diagnosis not present

## 2014-01-21 DIAGNOSIS — M25511 Pain in right shoulder: Secondary | ICD-10-CM | POA: Diagnosis not present

## 2014-01-21 DIAGNOSIS — M669 Spontaneous rupture of unspecified tendon: Secondary | ICD-10-CM | POA: Diagnosis not present

## 2014-01-23 DIAGNOSIS — M669 Spontaneous rupture of unspecified tendon: Secondary | ICD-10-CM | POA: Diagnosis not present

## 2014-01-23 DIAGNOSIS — M25511 Pain in right shoulder: Secondary | ICD-10-CM | POA: Diagnosis not present

## 2014-02-02 DIAGNOSIS — M669 Spontaneous rupture of unspecified tendon: Secondary | ICD-10-CM | POA: Diagnosis not present

## 2014-02-02 DIAGNOSIS — M25511 Pain in right shoulder: Secondary | ICD-10-CM | POA: Diagnosis not present

## 2014-02-03 DIAGNOSIS — M669 Spontaneous rupture of unspecified tendon: Secondary | ICD-10-CM | POA: Diagnosis not present

## 2014-02-03 DIAGNOSIS — M25511 Pain in right shoulder: Secondary | ICD-10-CM | POA: Diagnosis not present

## 2014-04-17 DIAGNOSIS — I10 Essential (primary) hypertension: Secondary | ICD-10-CM | POA: Diagnosis not present

## 2014-04-17 DIAGNOSIS — E785 Hyperlipidemia, unspecified: Secondary | ICD-10-CM | POA: Diagnosis not present

## 2014-04-17 DIAGNOSIS — F411 Generalized anxiety disorder: Secondary | ICD-10-CM | POA: Diagnosis not present

## 2014-04-17 DIAGNOSIS — Z789 Other specified health status: Secondary | ICD-10-CM | POA: Diagnosis not present

## 2014-04-17 DIAGNOSIS — G8929 Other chronic pain: Secondary | ICD-10-CM | POA: Diagnosis not present

## 2014-06-23 DIAGNOSIS — M75121 Complete rotator cuff tear or rupture of right shoulder, not specified as traumatic: Secondary | ICD-10-CM | POA: Diagnosis not present

## 2014-07-17 DIAGNOSIS — Z1239 Encounter for other screening for malignant neoplasm of breast: Secondary | ICD-10-CM | POA: Diagnosis not present

## 2014-07-17 DIAGNOSIS — M150 Primary generalized (osteo)arthritis: Secondary | ICD-10-CM | POA: Diagnosis not present

## 2014-07-17 DIAGNOSIS — F411 Generalized anxiety disorder: Secondary | ICD-10-CM | POA: Diagnosis not present

## 2014-07-17 DIAGNOSIS — I10 Essential (primary) hypertension: Secondary | ICD-10-CM | POA: Diagnosis not present

## 2014-07-17 DIAGNOSIS — E785 Hyperlipidemia, unspecified: Secondary | ICD-10-CM | POA: Diagnosis not present

## 2014-07-29 ENCOUNTER — Other Ambulatory Visit: Payer: Self-pay | Admitting: Family Medicine

## 2014-07-29 DIAGNOSIS — Z1231 Encounter for screening mammogram for malignant neoplasm of breast: Secondary | ICD-10-CM

## 2014-08-07 ENCOUNTER — Ambulatory Visit
Admission: RE | Admit: 2014-08-07 | Discharge: 2014-08-07 | Disposition: A | Discharge status: Home / Self Care | Payer: Medicare Other | Source: Ambulatory Visit | Attending: Family Medicine | Admitting: Family Medicine

## 2014-08-07 ENCOUNTER — Other Ambulatory Visit: Payer: Self-pay | Admitting: Family Medicine

## 2014-08-07 DIAGNOSIS — Z1231 Encounter for screening mammogram for malignant neoplasm of breast: Secondary | ICD-10-CM | POA: Insufficient documentation

## 2014-09-15 DIAGNOSIS — J302 Other seasonal allergic rhinitis: Secondary | ICD-10-CM | POA: Diagnosis not present

## 2014-09-15 DIAGNOSIS — R0982 Postnasal drip: Secondary | ICD-10-CM | POA: Diagnosis not present

## 2014-10-02 ENCOUNTER — Telehealth: Payer: Self-pay | Admitting: Family Medicine

## 2014-10-02 NOTE — Telephone Encounter (Signed)
Need a new script

## 2014-10-02 NOTE — Telephone Encounter (Signed)
Pt called hoping to get an appt with [facility] sooner than 10/21/14 because her Xanax runs out on 10/15/14. Pt was told that he has nothing available, but we would see if she could get a refill to last her until her appt. Please advise.

## 2014-10-09 ENCOUNTER — Other Ambulatory Visit: Payer: Self-pay | Admitting: Emergency Medicine

## 2014-10-09 ENCOUNTER — Telehealth: Payer: Self-pay | Admitting: Emergency Medicine

## 2014-10-09 ENCOUNTER — Other Ambulatory Visit: Payer: Self-pay

## 2014-10-09 DIAGNOSIS — F419 Anxiety disorder, unspecified: Secondary | ICD-10-CM

## 2014-10-09 MED ORDER — ALPRAZOLAM 0.5 MG PO TBDP: 0.5000 mg | ORAL_TABLET | Freq: Three times a day (TID) | ORAL | Status: DC

## 2014-10-09 NOTE — Telephone Encounter (Signed)
ok 

## 2014-10-09 NOTE — Telephone Encounter (Signed)
A 1 month supply of xanax is sent to pharmacy for patient pick up. Patient notified

## 2014-10-21 ENCOUNTER — Ambulatory Visit (INDEPENDENT_AMBULATORY_CARE_PROVIDER_SITE_OTHER): Payer: Medicare Other | Admitting: Family Medicine

## 2014-10-21 ENCOUNTER — Encounter: Payer: Self-pay | Admitting: Family Medicine

## 2014-10-21 VITALS — BP 120/84 | HR 83 | Temp 97.4°F | Resp 16 | Ht 67.0 in | Wt 179.4 lb

## 2014-10-21 DIAGNOSIS — Z720 Tobacco use: Secondary | ICD-10-CM

## 2014-10-21 DIAGNOSIS — M159 Polyosteoarthritis, unspecified: Secondary | ICD-10-CM

## 2014-10-21 DIAGNOSIS — M150 Primary generalized (osteo)arthritis: Secondary | ICD-10-CM

## 2014-10-21 DIAGNOSIS — F419 Anxiety disorder, unspecified: Secondary | ICD-10-CM

## 2014-10-21 DIAGNOSIS — E785 Hyperlipidemia, unspecified: Secondary | ICD-10-CM | POA: Diagnosis not present

## 2014-10-21 DIAGNOSIS — I10 Essential (primary) hypertension: Secondary | ICD-10-CM | POA: Diagnosis not present

## 2014-10-21 DIAGNOSIS — F329 Major depressive disorder, single episode, unspecified: Secondary | ICD-10-CM | POA: Insufficient documentation

## 2014-10-21 DIAGNOSIS — F32A Depression, unspecified: Secondary | ICD-10-CM | POA: Insufficient documentation

## 2014-10-21 MED ORDER — TRAMADOL-ACETAMINOPHEN 37.5-325 MG PO TABS: 1.0000 | ORAL_TABLET | Freq: Three times a day (TID) | ORAL | Status: DC | PRN

## 2014-10-21 MED ORDER — ALPRAZOLAM 0.5 MG PO TBDP: 0.5000 mg | ORAL_TABLET | Freq: Three times a day (TID) | ORAL | Status: DC

## 2014-10-21 MED ORDER — GABAPENTIN 300 MG PO CAPS
600.0000 mg | ORAL_CAPSULE | Freq: Two times a day (BID) | ORAL | Status: DC
Start: 2014-10-21 — End: 2014-10-22

## 2014-10-21 MED ORDER — NORTRIPTYLINE HCL 75 MG PO CAPS: 75.0000 mg | ORAL_CAPSULE | Freq: Every day | ORAL | Status: DC

## 2014-10-21 MED ORDER — PRAVASTATIN SODIUM 10 MG PO TABS: 10.0000 mg | ORAL_TABLET | Freq: Every day | ORAL | Status: DC

## 2014-10-21 MED ORDER — TRIAMTERENE-HCTZ 75-50 MG PO TABS: 0.5000 | ORAL_TABLET | Freq: Every day | ORAL | Status: DC

## 2014-10-21 MED ORDER — ATENOLOL 50 MG PO TABS: 50.0000 mg | ORAL_TABLET | Freq: Every day | ORAL | Status: DC

## 2014-10-22 ENCOUNTER — Encounter: Payer: Self-pay | Admitting: Family Medicine

## 2014-10-22 ENCOUNTER — Other Ambulatory Visit: Payer: Self-pay

## 2014-10-22 DIAGNOSIS — M159 Polyosteoarthritis, unspecified: Secondary | ICD-10-CM | POA: Insufficient documentation

## 2014-10-22 DIAGNOSIS — Z87891 Personal history of nicotine dependence: Secondary | ICD-10-CM

## 2014-10-22 DIAGNOSIS — E785 Hyperlipidemia, unspecified: Secondary | ICD-10-CM | POA: Insufficient documentation

## 2014-10-22 DIAGNOSIS — M150 Primary generalized (osteo)arthritis: Secondary | ICD-10-CM | POA: Insufficient documentation

## 2014-10-22 HISTORY — DX: Personal history of nicotine dependence: Z87.891

## 2014-10-22 MED ORDER — NORTRIPTYLINE HCL 75 MG PO CAPS: 75.0000 mg | ORAL_CAPSULE | Freq: Every day | ORAL | Status: DC

## 2014-10-22 MED ORDER — GABAPENTIN 300 MG PO CAPS: 600.0000 mg | ORAL_CAPSULE | Freq: Two times a day (BID) | ORAL | Status: DC

## 2014-10-22 NOTE — Progress Notes (Signed)
Name: Felicia Little   MRN: [id number]    DOB: 1945-12-31   Date:10/22/2014       Progress Note  Subjective  Chief Complaint  Chief Complaint  Patient presents with  . Hypertension  . Hyperlipidemia  . Anxiety  . Hip Pain    chronic with bacj pain, stiffness    Hypertension This is a chronic problem. The current episode started more than 1 year ago. The problem is unchanged. The problem is controlled. Associated symptoms include anxiety and palpitations. Pertinent negatives include no blurred vision, chest pain, headaches, neck pain, orthopnea or shortness of breath. There are no associated agents to hypertension. Risk factors for coronary artery disease include dyslipidemia. Past treatments include diuretics and beta blockers. The current treatment provides moderate improvement. There are no compliance problems.  Hypertensive end-organ damage includes kidney disease.  Hyperlipidemia This is a chronic problem. The current episode started more than 1 year ago. The problem is uncontrolled. Recent lipid tests were reviewed and are high. Factors aggravating her hyperlipidemia include fatty foods and smoking. Pertinent negatives include no chest pain, focal weakness, myalgias or shortness of breath. Current antihyperlipidemic treatment includes statins. There are no compliance problems.  Risk factors for coronary artery disease include dyslipidemia, hypertension, post-menopausal, a sedentary lifestyle and stress.  Anxiety Presents for follow-up visit. The problem has been gradually worsening. Symptoms include excessive worry, nervous/anxious behavior and palpitations. Patient reports no chest pain, dizziness, insomnia, nausea or shortness of breath. Symptoms occur most days. The severity of symptoms is moderate. The symptoms are aggravated by caffeine, family issues and specific phobias. The quality of sleep is fair.   Risk factors include a major life event and recent illness. There is no history of  suicide attempts. Past treatments include benzodiazephines and non-SSRI antidepressants. The treatment provided moderate relief. Compliance with prior treatments has been good. Prior compliance problems include difficulty understanding directions.  Hip Pain  There was no injury mechanism. The pain is present in the left hip. The quality of the pain is described as stabbing. The pain is severe. The pain has been worsening since onset. Pertinent negatives include no tingling. She has tried rest and acetaminophen (Past history is arthroplasty of the hip) for the symptoms. The treatment provided mild relief.      Past Medical History  Diagnosis Date  . Hypertension   . Depression   . Anxiety   . Pneumonia ~ 1995; 1996  . HWEXHBZJ(696.7)     "maybe once/week" (04/06/2012)  . Arthritis     "all over" (04/06/2012)  . Chronic lower back pain     History  Substance Use Topics  . Smoking status: Former Smoker -- 0.50 packs/day for 33 years    Types: Cigarettes    Quit date: 04/05/2011  . Smokeless tobacco: Never Used  . Alcohol Use: No     Comment: 04/06/2012 "tried alcohol once; didn't like it; never tried it again"     Current outpatient prescriptions:  .  pravastatin (PRAVACHOL) 10 MG tablet, Take 1 tablet (10 mg total) by mouth daily., Disp: 90 tablet, Rfl: 1 .  ALPRAZolam (NIRAVAM) 0.5 MG dissolvable tablet, Take 1 tablet (0.5 mg total) by mouth 3 (three) times daily., Disp: 90 tablet, Rfl: 2 .  atenolol (TENORMIN) 50 MG tablet, Take 1 tablet (50 mg total) by mouth daily., Disp: 90 tablet, Rfl: 1 .  Dermatological Products, Misc. (NUVAIL) SOLN, Apply to affected toenail(s) daily (Patient not taking: Reported on 10/21/2014), Disp: 15 mL, Rfl:  12 .  gabapentin (NEURONTIN) 300 MG capsule, Take 2 capsules (600 mg total) by mouth 2 (two) times daily., Disp: 180 capsule, Rfl: 1 .  nortriptyline (PAMELOR) 75 MG capsule, Take 1 capsule (75 mg total) by mouth at bedtime., Disp: 90 capsule, Rfl: 1 .   traMADol-acetaminophen (ULTRACET) 37.5-325 MG per tablet, Take 1 tablet by mouth every 8 (eight) hours as needed., Disp: 90 tablet, Rfl: 0 .  triamterene-hydrochlorothiazide (MAXZIDE) 75-50 MG per tablet, Take 0.5 tablets by mouth daily., Disp: 90 tablet, Rfl: 1  No Known Allergies  Review of Systems  Constitutional: Negative for fever, chills and weight loss.  HENT: Negative for congestion, hearing loss, sore throat and tinnitus.   Eyes: Negative for blurred vision, double vision and redness.  Respiratory: Negative for cough, hemoptysis and shortness of breath.   Cardiovascular: Positive for palpitations. Negative for chest pain, orthopnea, claudication and leg swelling.  Gastrointestinal: Negative for heartburn, nausea, vomiting, diarrhea, constipation and blood in stool.  Genitourinary: Negative for dysuria, urgency, frequency and hematuria.  Musculoskeletal: Negative for myalgias, back pain, joint pain, falls and neck pain.  Skin: Negative for itching.  Neurological: Negative for dizziness, tingling, tremors, focal weakness, seizures, loss of consciousness, weakness and headaches.  Endo/Heme/Allergies: Does not bruise/bleed easily.  Psychiatric/Behavioral: Negative for depression and substance abuse. The patient is nervous/anxious. The patient does not have insomnia.      Objective  Filed Vitals:   10/21/14 1511  BP: 120/84  Pulse: 83  Temp: 97.4 F (36.3 C)  TempSrc: Oral  Resp: 16  Height: 5\' 7"  (1.702 m)  Weight: 179 lb 6.4 oz (81.375 kg)  SpO2: 95%     Physical Exam  Constitutional: She is oriented to person, place, and time and well-developed, well-nourished, and in no distress.  HENT:  Head: Normocephalic.  Eyes: EOM are normal. Pupils are equal, round, and reactive to light.  Neck: Normal range of motion. No thyromegaly present.  Cardiovascular: Normal rate, regular rhythm and normal heart sounds.   No murmur heard. Pulmonary/Chest: Effort normal and breath  sounds normal.  Abdominal: Soft. Bowel sounds are normal.  Musculoskeletal: She exhibits tenderness. She exhibits no edema.  Neurological: She is alert and oriented to person, place, and time. No cranial nerve deficit. Gait normal.  Skin: Skin is warm and dry. No rash noted.  Psychiatric: Memory and affect normal.      Assessment & Plan  1. Essential hypertension Well-controlled - Comprehensive metabolic panel  2. Acute anxiety Stable - ALPRAZolam (NIRAVAM) 0.5 MG dissolvable tablet; Take 1 tablet (0.5 mg total) by mouth 3 (three) times daily.  Dispense: 90 tablet; Refill: 2 - TSH  3. Hyperlipidemia Needs lipid panel now that she is on a statin - Lipid panel  4. Primary osteoarthritis involving multiple joints Continue current regimen  5. Tobacco abuse Again emphasized the need to discontinue smoking but patient is not currently interested in pursuing

## 2014-10-22 NOTE — Patient Instructions (Signed)
Return to office in 3 months

## 2014-10-23 DIAGNOSIS — F419 Anxiety disorder, unspecified: Secondary | ICD-10-CM | POA: Diagnosis not present

## 2014-10-23 DIAGNOSIS — I10 Essential (primary) hypertension: Secondary | ICD-10-CM | POA: Diagnosis not present

## 2014-10-23 DIAGNOSIS — E785 Hyperlipidemia, unspecified: Secondary | ICD-10-CM | POA: Diagnosis not present

## 2014-10-24 LAB — LIPID PANEL
CHOL/HDL RATIO: 5.6 ratio — AB (ref 0.0–4.4)
CHOLESTEROL TOTAL: 218 mg/dL — AB (ref 100–199)
HDL: 39 mg/dL — AB (ref >39)
LDL CALC: 144 mg/dL — AB (ref 0–99)
TRIGLYCERIDES: 175 mg/dL — AB (ref 0–149)
VLDL Cholesterol Cal: 35 mg/dL (ref 5–40)

## 2014-10-24 LAB — COMPREHENSIVE METABOLIC PANEL
ALBUMIN: 4.1 g/dL (ref 3.5–4.8)
ALK PHOS: 80 IU/L (ref 39–117)
ALT: 9 IU/L (ref 0–32)
AST: 13 IU/L (ref 0–40)
Albumin/Globulin Ratio: 1.2 (ref 1.1–2.5)
BILIRUBIN TOTAL: 0.4 mg/dL (ref 0.0–1.2)
BUN / CREAT RATIO: 9 — AB (ref 11–26)
BUN: 9 mg/dL (ref 8–27)
CHLORIDE: 99 mmol/L (ref 97–108)
CO2: 26 mmol/L (ref 18–29)
CREATININE: 1.01 mg/dL — AB (ref 0.57–1.00)
Calcium: 9.6 mg/dL (ref 8.7–10.3)
GFR, EST AFRICAN AMERICAN: 65 mL/min/{1.73_m2} (ref >59)
GFR, EST NON AFRICAN AMERICAN: 56 mL/min/{1.73_m2} — AB (ref >59)
Globulin, Total: 3.3 g/dL (ref 1.5–4.5)
Glucose: 87 mg/dL (ref 65–99)
POTASSIUM: 4.3 mmol/L (ref 3.5–5.2)
Sodium: 140 mmol/L (ref 134–144)
TOTAL PROTEIN: 7.4 g/dL (ref 6.0–8.5)

## 2014-10-24 LAB — TSH: TSH: 0.676 u[IU]/mL (ref 0.450–4.500)

## 2014-10-27 ENCOUNTER — Telehealth: Payer: Self-pay | Admitting: Emergency Medicine

## 2014-10-27 MED ORDER — PRAVASTATIN SODIUM 20 MG PO TABS: 20.0000 mg | ORAL_TABLET | Freq: Every day | ORAL | Status: DC

## 2014-10-27 NOTE — Telephone Encounter (Signed)
Patient notified. New script called to [facility]

## 2014-10-29 DIAGNOSIS — T84020A Dislocation of internal right hip prosthesis, initial encounter: Secondary | ICD-10-CM | POA: Diagnosis not present

## 2014-10-29 DIAGNOSIS — Z96641 Presence of right artificial hip joint: Secondary | ICD-10-CM | POA: Diagnosis not present

## 2014-10-30 DIAGNOSIS — T84029A Dislocation of unspecified internal joint prosthesis, initial encounter: Secondary | ICD-10-CM | POA: Diagnosis not present

## 2014-11-24 ENCOUNTER — Other Ambulatory Visit: Payer: Self-pay | Admitting: Orthopedic Surgery

## 2014-11-24 DIAGNOSIS — T84020D Dislocation of internal right hip prosthesis, subsequent encounter: Secondary | ICD-10-CM

## 2014-11-26 ENCOUNTER — Other Ambulatory Visit: Payer: Self-pay | Admitting: Orthopedic Surgery

## 2014-11-26 ENCOUNTER — Ambulatory Visit
Admission: RE | Admit: 2014-11-26 | Discharge: 2014-11-26 | Disposition: A | Discharge status: Home / Self Care | Payer: Medicare Other | Source: Ambulatory Visit | Attending: Orthopedic Surgery | Admitting: Orthopedic Surgery

## 2014-11-26 DIAGNOSIS — T84020A Dislocation of internal right hip prosthesis, initial encounter: Secondary | ICD-10-CM | POA: Diagnosis not present

## 2014-11-26 DIAGNOSIS — T84020D Dislocation of internal right hip prosthesis, subsequent encounter: Secondary | ICD-10-CM | POA: Diagnosis not present

## 2014-11-26 LAB — SYNOVIAL CELL COUNT + DIFF, W/ CRYSTALS
EOSINOPHILS-SYNOVIAL: 0 % (ref 0–1)
LYMPHOCYTES-SYNOVIAL FLD: 67 % — AB (ref 0–20)
Monocyte/Macrophage: 27 % — ABNORMAL LOW (ref 50–90)
NEUTROPHIL, SYNOVIAL: 6 % (ref 0–25)
WBC, Synovial: 82 cu mm (ref 0–200)

## 2014-11-26 MED ORDER — IOHEXOL 180 MG/ML  SOLN
1.0000 mL | Freq: Once | INTRAMUSCULAR | Status: DC | PRN
  Administered 2014-11-26: 1 mL via INTRA_ARTICULAR

## 2014-11-30 LAB — BODY FLUID CULTURE
GRAM STAIN: NONE SEEN
ORGANISM ID, BACTERIA: NO GROWTH

## 2014-12-12 DIAGNOSIS — T84029A Dislocation of unspecified internal joint prosthesis, initial encounter: Secondary | ICD-10-CM | POA: Diagnosis not present

## 2014-12-12 DIAGNOSIS — Z96641 Presence of right artificial hip joint: Secondary | ICD-10-CM | POA: Diagnosis not present

## 2014-12-24 ENCOUNTER — Ambulatory Visit: Payer: Self-pay | Admitting: Orthopedic Surgery

## 2014-12-26 ENCOUNTER — Encounter: Payer: Self-pay | Admitting: Family Medicine

## 2015-01-15 ENCOUNTER — Encounter (HOSPITAL_COMMUNITY): Payer: Self-pay

## 2015-01-15 ENCOUNTER — Other Ambulatory Visit (HOSPITAL_COMMUNITY): Payer: Self-pay | Admitting: *Deleted

## 2015-01-15 ENCOUNTER — Encounter (HOSPITAL_COMMUNITY)
Admission: RE | Admit: 2015-01-15 | Discharge: 2015-01-15 | Disposition: A | Discharge status: Home / Self Care | Payer: Medicare Other | Source: Ambulatory Visit | Attending: Orthopedic Surgery | Admitting: Orthopedic Surgery

## 2015-01-15 DIAGNOSIS — Z0183 Encounter for blood typing: Secondary | ICD-10-CM | POA: Insufficient documentation

## 2015-01-15 DIAGNOSIS — Y838 Other surgical procedures as the cause of abnormal reaction of the patient, or of later complication, without mention of misadventure at the time of the procedure: Secondary | ICD-10-CM | POA: Diagnosis not present

## 2015-01-15 DIAGNOSIS — Z01812 Encounter for preprocedural laboratory examination: Secondary | ICD-10-CM | POA: Insufficient documentation

## 2015-01-15 DIAGNOSIS — T84090A Other mechanical complication of internal right hip prosthesis, initial encounter: Secondary | ICD-10-CM | POA: Diagnosis not present

## 2015-01-15 LAB — CBC
HEMATOCRIT: 40.5 % (ref 36.0–46.0)
HEMOGLOBIN: 13 g/dL (ref 12.0–15.0)
MCH: 28 pg (ref 26.0–34.0)
MCHC: 32.1 g/dL (ref 30.0–36.0)
MCV: 87.1 fL (ref 78.0–100.0)
Platelets: 337 10*3/uL (ref 150–400)
RBC: 4.65 MIL/uL (ref 3.87–5.11)
RDW: 13.9 % (ref 11.5–15.5)
WBC: 8.8 10*3/uL (ref 4.0–10.5)

## 2015-01-15 LAB — URINALYSIS, ROUTINE W REFLEX MICROSCOPIC
BILIRUBIN URINE: NEGATIVE
Glucose, UA: NEGATIVE mg/dL
HGB URINE DIPSTICK: NEGATIVE
Ketones, ur: NEGATIVE mg/dL
NITRITE: POSITIVE — AB
PROTEIN: NEGATIVE mg/dL
Specific Gravity, Urine: 1.007 (ref 1.005–1.030)
UROBILINOGEN UA: 0.2 mg/dL (ref 0.0–1.0)
pH: 7 (ref 5.0–8.0)

## 2015-01-15 LAB — COMPREHENSIVE METABOLIC PANEL
ALBUMIN: 3.8 g/dL (ref 3.5–5.0)
ALK PHOS: 74 U/L (ref 38–126)
ALT: 14 U/L (ref 14–54)
ANION GAP: 7 (ref 5–15)
AST: 18 U/L (ref 15–41)
BILIRUBIN TOTAL: 0.8 mg/dL (ref 0.3–1.2)
BUN: 8 mg/dL (ref 6–20)
CALCIUM: 9.3 mg/dL (ref 8.9–10.3)
CO2: 26 mmol/L (ref 22–32)
Chloride: 104 mmol/L (ref 101–111)
Creatinine, Ser: 0.99 mg/dL (ref 0.44–1.00)
GFR calc Af Amer: >60 mL/min (ref >60)
GFR calc non Af Amer: 56 mL/min — ABNORMAL LOW (ref >60)
GLUCOSE: 93 mg/dL (ref 65–99)
Potassium: 4.1 mmol/L (ref 3.5–5.1)
SODIUM: 137 mmol/L (ref 135–145)
TOTAL PROTEIN: 7.8 g/dL (ref 6.5–8.1)

## 2015-01-15 LAB — APTT: APTT: 29 s (ref 24–37)

## 2015-01-15 LAB — TYPE AND SCREEN
ABO/RH(D): A POS
ANTIBODY SCREEN: NEGATIVE

## 2015-01-15 LAB — SURGICAL PCR SCREEN
MRSA, PCR: NEGATIVE
STAPHYLOCOCCUS AUREUS: NEGATIVE

## 2015-01-15 LAB — URINE MICROSCOPIC-ADD ON

## 2015-01-15 LAB — PROTIME-INR
INR: 1.1 (ref 0.00–1.49)
Prothrombin Time: 14.4 seconds (ref 11.6–15.2)

## 2015-01-15 NOTE — Pre-Procedure Instructions (Signed)
Felicia Little  01/15/2015     Your procedure is scheduled on Monday, January 26, 2015 at 12:45 PM.   Report to [facility] Entrance "A" Admitting Office at 10:45 AM.   Call this number if you have problems the morning of surgery: [phone]   Any questions prior to day of surgery, please call [phone] between 8 & 4 PM.   Remember:  Do not eat food or drink liquids after midnight Sunday, 01/25/15.  Take these medicines the morning of surgery with A SIP OF WATER: Alprazolam (Niravam), Atenolol (Tenormin), Gabapentin (Neurontin), Tramadol (Ultracet) - if needed   Do not wear jewelry, make-up or nail polish.  Do not wear lotions, powders, or perfumes.  You may wear deodorant.  Do not shave 48 hours prior to surgery.    Do not bring valuables to the hospital.  [facility] is not responsible for any belongings or valuables.  Contacts, dentures or bridgework may not be worn into surgery.  Leave your suitcase in the car.  After surgery it may be brought to your room.  For patients admitted to the hospital, discharge time will be determined by your treatment team.  Special instructions:  [place] - Preparing for Surgery  Before surgery, you can play an important role.  Because skin is not sterile, your skin needs to be as free of germs as possible.  You can reduce the number of germs on you skin by washing with CHG (chlorahexidine gluconate) soap before surgery.  CHG is an antiseptic cleaner which kills germs and bonds with the skin to continue killing germs even after washing.  Please DO NOT use if you have an allergy to CHG or antibacterial soaps.  If your skin becomes reddened/irritated stop using the CHG and inform your nurse when you arrive at Short Stay.  Do not shave (including legs and underarms) for at least 48 hours prior to the first CHG shower.  You may shave your face.  Please follow these instructions carefully:   1.  Shower with CHG Soap the night before  surgery and the                                morning of Surgery.  2.  If you choose to wash your hair, wash your hair first as usual with your       normal shampoo.  3.  After you shampoo, rinse your hair and body thoroughly to remove the                      Shampoo.  4.  Use CHG as you would any other liquid soap.  You can apply chg directly       to the skin and wash gently with scrungie or a clean washcloth.  5.  Apply the CHG Soap to your body ONLY FROM THE NECK DOWN.        Do not use on open wounds or open sores.  Avoid contact with your eyes, ears, mouth and genitals (private parts).  Wash genitals (private parts) with your normal soap.  6.  Wash thoroughly, paying special attention to the area where your surgery        will be performed.  7.  Thoroughly rinse your body with warm water from the neck down.  8.  DO NOT shower/wash with your normal soap after using and rinsing off  the CHG Soap.  9.  Pat yourself dry with a clean towel.            10.  Wear clean pajamas.            11.  Place clean sheets on your bed the night of your first shower and do not        sleep with pets.  Day of Surgery  Do not apply any lotions the morning of surgery.  Please wear clean clothes to the hospital.  Please read over the following fact sheets that you were given. Pain Booklet, Coughing and Deep Breathing, Blood Transfusion Information, MRSA Information and Surgical Site Infection Prevention

## 2015-01-15 NOTE — Progress Notes (Signed)
Pt denies any cardiac history, chest pain or sob. 

## 2015-01-18 ENCOUNTER — Ambulatory Visit: Payer: Self-pay | Admitting: Orthopedic Surgery

## 2015-01-18 NOTE — H&P (Signed)
TOTAL HIP REVISION ADMISSION H&P  Patient is admitted for right revision total hip arthroplasty.  Subjective:  Chief Complaint: right hip pain  HPI: Felicia Little, 69 y.o. female, has a history of pain and functional disability in the right hip due to arthritis and patient has failed non-surgical conservative treatments for greater than 12 weeks to include NSAID's and/or analgesics, flexibility and strengthening excercises, use of assistive devices, weight reduction as appropriate and activity modification. The indications for the revision total hip arthroplasty are instability due to malpositioned acetabular component.  Onset of symptoms was gradual starting 1 years ago with rapidlly worsening course since that time.  Prior procedures on the bilaterally hip include arthroplasty.  Patient currently rates pain in the right hip at 10 out of 10 with activity.  There is pain that interfers with activities of daily living and instability with motion. Patient has evidence of malpositioned acetabular component by imaging studies.  This condition presents safety issues increasing the risk of falls.  There is no current active infection.  Patient Active Problem List   Diagnosis Date Noted  . Hyperlipidemia 10/22/2014  . Primary osteoarthritis involving multiple joints 10/22/2014  . Tobacco abuse 10/22/2014  . Acute anxiety 10/21/2014  . Essential hypertension 10/21/2014  . Tendinitis of right shoulder 08/21/2013   Past Medical History  Diagnosis Date  . Hypertension   . Depression   . Anxiety   . Pneumonia ~ 1995; 1996  . Arthritis     "all over" (04/06/2012)  . Chronic lower back pain   . Kidney stones   . OFBPZWCH(852.7)     "maybe once/week" (04/06/2012) - none recently    Past Surgical History  Procedure Laterality Date  . Joint replacement      BIL  HIP  REPLACEMENTS   . Carpal tunnel release  1990's    "right" (04/06/2012)  . Knee arthroscopy  1990's    "right" (04/06/2012)  . Posterior  lumbar fusion  04/06/2012  . Cholecystectomy  1990's  . Total hip arthroplasty  2005; 2007    "right; left" (04/06/2012)  . Vaginal hysterectomy  1979  . 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 Smoking status: Former Smoker -- 0.50 packs/day for 33 years    Types: Cigarettes    Quit date: 05/02/2014  . Smokeless tobacco: Never Used  . Alcohol Use: No     Comment: 04/06/2012 "tried alcohol once; didn't like it; never tried it again"    Family History  Problem Relation Age of Onset  . Hypertension Daughter   . Heart attack Mother   . Alzheimer's disease Mother       Review of Systems  Constitutional: Negative.   HENT: Negative.   Eyes: Negative.   Respiratory: Negative.   Cardiovascular: Negative.   Gastrointestinal: Negative.   Genitourinary: Negative.   Musculoskeletal: Positive for back pain and joint pain.  Skin: Negative.   Neurological: Negative.   Endo/Heme/Allergies: Negative.   Psychiatric/Behavioral: Negative.     Objective:  Physical Exam  Vitals reviewed. Constitutional: She is oriented to person, place, and time. She appears well-developed and well-nourished.  HENT:  Head: Normocephalic and atraumatic.  Eyes: Conjunctivae  and EOM are normal. Pupils are equal, round, and reactive to light.  Neck: Normal range of motion. Neck supple.  Cardiovascular: Normal rate, regular rhythm, normal heart sounds and intact distal pulses.   Respiratory: Effort normal and breath sounds normal. No respiratory distress.  GI: Soft. Bowel sounds are normal. She exhibits no distension. There is no tenderness.  Genitourinary:  deferred  Musculoskeletal:        Right hip: She exhibits decreased range of motion.  incis healed. + instability with ROM.   Neurological: She is alert and oriented to person, place, and time. She has normal reflexes.  Skin: Skin is warm and dry.  Psychiatric: She has a normal mood and affect. Her behavior is normal. Judgment and thought content normal.    Vital signs in last 24 hours: @VSRANGES @   Labs:   Estimated body mass index is 29.19 kg/(m^2) as calculated from the following:   Height as of 01/15/15: 5' 5.5" (1.664 m).   Weight as of 01/15/15: 80.831 kg (178 lb 3.2 oz).  Imaging Review:  Plain radiographs demonstrate bilateral THAs. The bone quality appears to be adequate for age and reported activity level. There is vertical orientation of the right acetabular component.  Assessment/Plan:  End stage arthritis, right hip(s) with failed previous arthroplasty.  The patient history, physical examination, clinical judgement of the provider and imaging studies are consistent with end stage degenerative joint disease of the right hip(s), previous total hip arthroplasty. Revision total hip arthroplasty is deemed medically necessary. The treatment options including medical management, injection therapy, arthroscopy and arthroplasty were discussed at length. The risks and benefits of total hip arthroplasty were presented and reviewed. The risks due to aseptic loosening, infection, stiffness, dislocation/subluxation,  thromboembolic complications and other imponderables were discussed.  The patient acknowledged the explanation, agreed to proceed with the plan and consent was signed. Patient is being admitted for inpatient treatment for surgery, pain control, PT, OT, prophylactic antibiotics, VTE prophylaxis, progressive ambulation and ADL's and discharge planning. The patient is planning to be discharged home with home health services

## 2015-01-21 ENCOUNTER — Ambulatory Visit (INDEPENDENT_AMBULATORY_CARE_PROVIDER_SITE_OTHER): Payer: Medicare Other | Admitting: Family Medicine

## 2015-01-21 ENCOUNTER — Encounter: Payer: Self-pay | Admitting: Family Medicine

## 2015-01-21 VITALS — BP 124/70 | HR 74 | Temp 98.2°F | Resp 16 | Ht 66.0 in | Wt 177.6 lb

## 2015-01-21 DIAGNOSIS — M1610 Unilateral primary osteoarthritis, unspecified hip: Secondary | ICD-10-CM

## 2015-01-21 DIAGNOSIS — Z23 Encounter for immunization: Secondary | ICD-10-CM

## 2015-01-21 DIAGNOSIS — E785 Hyperlipidemia, unspecified: Secondary | ICD-10-CM | POA: Diagnosis not present

## 2015-01-21 DIAGNOSIS — I10 Essential (primary) hypertension: Secondary | ICD-10-CM

## 2015-01-21 DIAGNOSIS — F419 Anxiety disorder, unspecified: Secondary | ICD-10-CM | POA: Diagnosis not present

## 2015-01-21 DIAGNOSIS — G8929 Other chronic pain: Secondary | ICD-10-CM | POA: Diagnosis not present

## 2015-01-21 MED ORDER — TRAMADOL-ACETAMINOPHEN 37.5-325 MG PO TABS: 1.0000 | ORAL_TABLET | Freq: Three times a day (TID) | ORAL | Status: DC | PRN

## 2015-01-21 MED ORDER — ALPRAZOLAM 0.5 MG PO TBDP: 0.5000 mg | ORAL_TABLET | Freq: Three times a day (TID) | ORAL | Status: DC

## 2015-01-21 MED ORDER — PRAVASTATIN SODIUM 20 MG PO TABS: 20.0000 mg | ORAL_TABLET | Freq: Every day | ORAL | Status: DC

## 2015-01-21 NOTE — Progress Notes (Signed)
Name: Felicia Little   MRN: [id number]    DOB: 11-Aug-1945   Date:01/21/2015       Progress Note  Subjective  Chief Complaint  Chief Complaint  Patient presents with  . Hypertension  . Hyperlipidemia  . Hip Pain    Surgery scheduled for October 24th    HPI  Hypertension   Patient presents for follow-up of hypertension. It has been present for over 5 years.  Patient states that there is compliance with medical regimen which consists of Maxide 75 mg once daily and atenolol 50 mg once daily . There is no end organ disease. Cardiac risk factors include hypertension hyperlipidemia and diabetes.  Exercise regimen consist of none due to her hip pain .  Diet consist of some salt restriction .  Hyperlipidemia  Patient has a history of hyperlipidemia for over 5 years.  Current medical regimen consist of pravastatin 20 mg daily at bedtime .  Compliance is good .  Diet and exercise are currently followed as tolerated .  Risk factors for cardiovascular disease include hyperlipidemia hypertension and tobacco abuse .   There have been no side effects from the medication.    Chronic pain  Patient is scheduled for hip surgery in October 24. She needs a refill of her pain meds before and she will come in and scheduled  Past Medical History  Diagnosis Date  . Hypertension   . Depression   . Anxiety   . Pneumonia ~ 1995; 1996  . Arthritis     "all over" (04/06/2012)  . Chronic lower back pain   . Kidney stones   . WFUXNATF(573.2)     "maybe once/week" (04/06/2012) - none recently    Social History  Substance Use Topics  . Smoking status: Former Smoker -- 0.50 packs/day for 33 years    Types: Cigarettes    Quit date: 05/02/2014  . Smokeless tobacco: Never Used  . Alcohol Use: No     Comment: 04/06/2012 "tried alcohol once; didn't like it; never tried it again"     Current outpatient prescriptions:  .  ALPRAZolam (NIRAVAM) 0.5 MG dissolvable tablet, Take 1 tablet (0.5 mg total) by mouth 3  (three) times daily., Disp: 90 tablet, Rfl: 2 .  atenolol (TENORMIN) 50 MG tablet, Take 1 tablet (50 mg total) by mouth daily., Disp: 90 tablet, Rfl: 1 .  Dermatological Products, Misc. (NUVAIL) SOLN, Apply to affected toenail(s) daily (Patient not taking: Reported on 10/21/2014), Disp: 15 mL, Rfl: 12 .  gabapentin (NEURONTIN) 300 MG capsule, Take 2 capsules (600 mg total) by mouth 2 (two) times daily. (Patient taking differently: Take 600 mg by mouth at bedtime. ), Disp: 180 capsule, Rfl: 1 .  nortriptyline (PAMELOR) 75 MG capsule, Take 1 capsule (75 mg total) by mouth at bedtime., Disp: 90 capsule, Rfl: 1 .  pravastatin (PRAVACHOL) 20 MG tablet, Take 1 tablet (20 mg total) by mouth at bedtime., Disp: 90 tablet, Rfl: 1 .  traMADol-acetaminophen (ULTRACET) 37.5-325 MG per tablet, Take 1 tablet by mouth every 8 (eight) hours as needed., Disp: 90 tablet, Rfl: 0 .  triamterene-hydrochlorothiazide (MAXZIDE) 75-50 MG per tablet, Take 0.5 tablets by mouth daily., Disp: 90 tablet, Rfl: 1  No Known Allergies  Review of Systems  Constitutional: Negative for fever, chills and weight loss.  HENT: Negative for congestion, hearing loss, sore throat and tinnitus.   Eyes: Negative for blurred vision, double vision and redness.  Respiratory: Negative for cough, hemoptysis and shortness of breath.  Cardiovascular: Negative for chest pain, palpitations, orthopnea, claudication and leg swelling.  Gastrointestinal: Negative for heartburn, nausea, vomiting, diarrhea, constipation and blood in stool.  Genitourinary: Negative for dysuria, urgency, frequency and hematuria.  Musculoskeletal: Negative for myalgias, back pain, joint pain (hip pain), falls and neck pain.  Skin: Negative for itching.  Neurological: Negative for dizziness, tingling, tremors, focal weakness, seizures, loss of consciousness, weakness and headaches.  Endo/Heme/Allergies: Does not bruise/bleed easily.  Psychiatric/Behavioral: Negative for  depression and substance abuse. The patient is not nervous/anxious and does not have insomnia.      Objective  Filed Vitals:   01/21/15 1126  BP: 124/70  Pulse: 74  Temp: 98.2 F (36.8 C)  TempSrc: Oral  Resp: 16  Height: 5\' 6"  (1.676 m)  Weight: 177 lb 9.6 oz (80.559 kg)  SpO2: 97%     Physical Exam  Constitutional: She is oriented to person, place, and time and well-developed, well-nourished, and in no distress.  HENT:  Head: Normocephalic.  Eyes: EOM are normal. Pupils are equal, round, and reactive to light.  Neck: Normal range of motion. No thyromegaly present.  Cardiovascular: Normal rate, regular rhythm and normal heart sounds.   No murmur heard. Pulmonary/Chest: Effort normal and breath sounds normal.  Abdominal: Soft. Bowel sounds are normal.  Musculoskeletal: She exhibits tenderness. She exhibits no edema.  Antalgic gait and discomfort with movement sitting to standing and from standing to the exam table exam table to standing.  Tenderness over the left hip  Neurological: She is alert and oriented to person, place, and time. No cranial nerve deficit. Gait normal.  Skin: Skin is warm and dry. No rash noted.  Psychiatric: Memory normal.  Anxious and loquacious      Assessment & Plan  1. Need for influenza vaccination Given - Flu vaccine HIGH DOSE PF (Fluzone High dose)  2. Acute anxiety Stable on current regimen - ALPRAZolam (NIRAVAM) 0.5 MG dissolvable tablet; Take 1 tablet (0.5 mg total) by mouth 3 (three) times daily.  Dispense: 90 tablet; Refill: 2  3. Primary osteoarthritis of one hip Procedure by orthopedic surgeon in the next few weeks  4. Chronic pain Renew Ultracet 3 months  5. Essential hypertension Well-controlled  6. Hyperlipidemia Well-controlled

## 2015-01-25 MED ORDER — SODIUM CHLORIDE 0.9 % IV SOLN: INTRAVENOUS | Status: DC

## 2015-01-25 MED ORDER — CEFAZOLIN SODIUM-DEXTROSE 2-3 GM-% IV SOLR
2.0000 g | INTRAVENOUS | Status: AC
  Administered 2015-01-26: 2 g via INTRAVENOUS
  Filled 2015-01-25: qty 50

## 2015-01-25 MED ORDER — CHLORHEXIDINE GLUCONATE 4 % EX LIQD: 60.0000 mL | Freq: Once | CUTANEOUS | Status: DC

## 2015-01-25 MED ORDER — TRANEXAMIC ACID 1000 MG/10ML IV SOLN
1000.0000 mg | INTRAVENOUS | Status: AC
  Administered 2015-01-26: 1000 mg via INTRAVENOUS
  Filled 2015-01-25: qty 10

## 2015-01-26 ENCOUNTER — Inpatient Hospital Stay (HOSPITAL_COMMUNITY): Payer: Medicare Other | Admitting: Anesthesiology

## 2015-01-26 ENCOUNTER — Encounter (HOSPITAL_COMMUNITY)
Admission: RE | Disposition: A | Discharge status: Home Health | Payer: Self-pay | Source: Ambulatory Visit | Attending: Orthopedic Surgery

## 2015-01-26 ENCOUNTER — Encounter (HOSPITAL_COMMUNITY): Payer: Self-pay | Admitting: Surgery

## 2015-01-26 ENCOUNTER — Inpatient Hospital Stay (HOSPITAL_COMMUNITY)
Admission: RE | Admit: 2015-01-26 | Discharge: 2015-01-28 | DRG: 468 | Disposition: A | Discharge status: Home Health | Payer: Medicare Other | Source: Ambulatory Visit | Attending: Orthopedic Surgery | Admitting: Orthopedic Surgery

## 2015-01-26 ENCOUNTER — Inpatient Hospital Stay (HOSPITAL_COMMUNITY): Payer: Medicare Other

## 2015-01-26 DIAGNOSIS — Y792 Prosthetic and other implants, materials and accessory orthopedic devices associated with adverse incidents: Secondary | ICD-10-CM | POA: Diagnosis present

## 2015-01-26 DIAGNOSIS — Z8249 Family history of ischemic heart disease and other diseases of the circulatory system: Secondary | ICD-10-CM | POA: Diagnosis not present

## 2015-01-26 DIAGNOSIS — Z96641 Presence of right artificial hip joint: Secondary | ICD-10-CM | POA: Diagnosis not present

## 2015-01-26 DIAGNOSIS — Z96643 Presence of artificial hip joint, bilateral: Secondary | ICD-10-CM | POA: Diagnosis present

## 2015-01-26 DIAGNOSIS — E785 Hyperlipidemia, unspecified: Secondary | ICD-10-CM | POA: Diagnosis present

## 2015-01-26 DIAGNOSIS — Z820 Family history of epilepsy and other diseases of the nervous system: Secondary | ICD-10-CM | POA: Diagnosis not present

## 2015-01-26 DIAGNOSIS — G8929 Other chronic pain: Secondary | ICD-10-CM | POA: Diagnosis present

## 2015-01-26 DIAGNOSIS — T84028A Dislocation of other internal joint prosthesis, initial encounter: Secondary | ICD-10-CM

## 2015-01-26 DIAGNOSIS — Z96649 Presence of unspecified artificial hip joint: Secondary | ICD-10-CM

## 2015-01-26 DIAGNOSIS — Z4732 Aftercare following explantation of hip joint prosthesis: Secondary | ICD-10-CM | POA: Diagnosis not present

## 2015-01-26 DIAGNOSIS — F329 Major depressive disorder, single episode, unspecified: Secondary | ICD-10-CM | POA: Diagnosis present

## 2015-01-26 DIAGNOSIS — I10 Essential (primary) hypertension: Secondary | ICD-10-CM | POA: Diagnosis present

## 2015-01-26 DIAGNOSIS — F419 Anxiety disorder, unspecified: Secondary | ICD-10-CM | POA: Diagnosis present

## 2015-01-26 DIAGNOSIS — Z87891 Personal history of nicotine dependence: Secondary | ICD-10-CM | POA: Diagnosis not present

## 2015-01-26 DIAGNOSIS — M25551 Pain in right hip: Secondary | ICD-10-CM | POA: Diagnosis present

## 2015-01-26 DIAGNOSIS — M545 Low back pain: Secondary | ICD-10-CM | POA: Diagnosis not present

## 2015-01-26 DIAGNOSIS — Z471 Aftercare following joint replacement surgery: Secondary | ICD-10-CM | POA: Diagnosis not present

## 2015-01-26 DIAGNOSIS — T84020A Dislocation of internal right hip prosthesis, initial encounter: Secondary | ICD-10-CM | POA: Diagnosis present

## 2015-01-26 DIAGNOSIS — Z09 Encounter for follow-up examination after completed treatment for conditions other than malignant neoplasm: Secondary | ICD-10-CM

## 2015-01-26 DIAGNOSIS — Z419 Encounter for procedure for purposes other than remedying health state, unspecified: Secondary | ICD-10-CM

## 2015-01-26 DIAGNOSIS — M1990 Unspecified osteoarthritis, unspecified site: Secondary | ICD-10-CM | POA: Diagnosis not present

## 2015-01-26 HISTORY — PX: ANTERIOR HIP REVISION: SHX6527

## 2015-01-26 SURGERY — REVISION, TOTAL ARTHROPLASTY, HIP, ANTERIOR APPROACH
Anesthesia: General | Site: Hip | Laterality: Right

## 2015-01-26 MED ORDER — ROCURONIUM BROMIDE 50 MG/5ML IV SOLN
INTRAVENOUS | Status: AC
  Filled 2015-01-26: qty 1

## 2015-01-26 MED ORDER — 0.9 % SODIUM CHLORIDE (POUR BTL) OPTIME
TOPICAL | Status: DC | PRN
  Administered 2015-01-26: 1000 mL

## 2015-01-26 MED ORDER — KETOROLAC TROMETHAMINE 30 MG/ML IJ SOLN
INTRAMUSCULAR | Status: DC | PRN
  Administered 2015-01-26: 30 mg via INTRA_ARTICULAR

## 2015-01-26 MED ORDER — CEFAZOLIN SODIUM-DEXTROSE 2-3 GM-% IV SOLR
2.0000 g | Freq: Four times a day (QID) | INTRAVENOUS | Status: AC
  Administered 2015-01-26 – 2015-01-27 (×2): 2 g via INTRAVENOUS
  Filled 2015-01-26 (×2): qty 50

## 2015-01-26 MED ORDER — LIDOCAINE HCL (CARDIAC) 20 MG/ML IV SOLN
INTRAVENOUS | Status: AC
  Filled 2015-01-26: qty 5

## 2015-01-26 MED ORDER — ARTIFICIAL TEARS OP OINT
TOPICAL_OINTMENT | OPHTHALMIC | Status: AC
  Filled 2015-01-26: qty 3.5

## 2015-01-26 MED ORDER — METOCLOPRAMIDE HCL 5 MG PO TABS: 5.0000 mg | ORAL_TABLET | Freq: Three times a day (TID) | ORAL | Status: DC | PRN

## 2015-01-26 MED ORDER — ACETAMINOPHEN 10 MG/ML IV SOLN
1000.0000 mg | INTRAVENOUS | Status: DC
  Filled 2015-01-26: qty 100

## 2015-01-26 MED ORDER — PROPOFOL 10 MG/ML IV BOLUS
INTRAVENOUS | Status: DC | PRN
  Administered 2015-01-26: 50 mg via INTRAVENOUS
  Administered 2015-01-26: 120 mg via INTRAVENOUS

## 2015-01-26 MED ORDER — PHENOL 1.4 % MT LIQD: 1.0000 | OROMUCOSAL | Status: DC | PRN

## 2015-01-26 MED ORDER — ONDANSETRON HCL 4 MG PO TABS
4.0000 mg | ORAL_TABLET | Freq: Four times a day (QID) | ORAL | Status: DC | PRN
Start: 2015-01-26 — End: 2015-01-28

## 2015-01-26 MED ORDER — FENTANYL CITRATE (PF) 250 MCG/5ML IJ SOLN
INTRAMUSCULAR | Status: AC
  Filled 2015-01-26: qty 5

## 2015-01-26 MED ORDER — LIDOCAINE HCL (CARDIAC) 20 MG/ML IV SOLN
INTRAVENOUS | Status: DC | PRN
  Administered 2015-01-26: 60 mg via INTRAVENOUS

## 2015-01-26 MED ORDER — PROMETHAZINE HCL 25 MG/ML IJ SOLN: 6.2500 mg | INTRAMUSCULAR | Status: DC | PRN

## 2015-01-26 MED ORDER — SODIUM CHLORIDE 0.9 % IV SOLN
INTRAVENOUS | Status: DC
  Administered 2015-01-26: 21:00:00 via INTRAVENOUS

## 2015-01-26 MED ORDER — ALPRAZOLAM 0.5 MG PO TBDP: 0.5000 mg | ORAL_TABLET | Freq: Three times a day (TID) | ORAL | Status: DC

## 2015-01-26 MED ORDER — ALPRAZOLAM 0.5 MG PO TABS
0.5000 mg | ORAL_TABLET | Freq: Three times a day (TID) | ORAL | Status: DC
  Administered 2015-01-26 – 2015-01-28 (×5): 0.5 mg via ORAL
  Filled 2015-01-26 (×5): qty 1

## 2015-01-26 MED ORDER — APIXABAN 2.5 MG PO TABS
2.5000 mg | ORAL_TABLET | Freq: Two times a day (BID) | ORAL | Status: DC
  Administered 2015-01-27 – 2015-01-28 (×3): 2.5 mg via ORAL
  Filled 2015-01-26 (×3): qty 1

## 2015-01-26 MED ORDER — DOCUSATE SODIUM 100 MG PO CAPS
100.0000 mg | ORAL_CAPSULE | Freq: Two times a day (BID) | ORAL | Status: DC
  Administered 2015-01-27 – 2015-01-28 (×3): 100 mg via ORAL
  Filled 2015-01-26 (×4): qty 1

## 2015-01-26 MED ORDER — DEXMEDETOMIDINE HCL IN NACL 200 MCG/50ML IV SOLN
INTRAVENOUS | Status: AC
  Filled 2015-01-26: qty 50

## 2015-01-26 MED ORDER — HYDROCODONE-ACETAMINOPHEN 5-325 MG PO TABS
1.0000 | ORAL_TABLET | ORAL | Status: DC | PRN
  Administered 2015-01-27 – 2015-01-28 (×4): 2 via ORAL
  Filled 2015-01-26 (×4): qty 2

## 2015-01-26 MED ORDER — PROPOFOL 10 MG/ML IV BOLUS
INTRAVENOUS | Status: AC
  Filled 2015-01-26: qty 20

## 2015-01-26 MED ORDER — STERILE WATER FOR INJECTION IJ SOLN
INTRAMUSCULAR | Status: AC
  Filled 2015-01-26: qty 10

## 2015-01-26 MED ORDER — ATENOLOL 50 MG PO TABS
50.0000 mg | ORAL_TABLET | Freq: Every day | ORAL | Status: DC
  Administered 2015-01-27 – 2015-01-28 (×2): 50 mg via ORAL
  Filled 2015-01-26 (×2): qty 1

## 2015-01-26 MED ORDER — HYDROMORPHONE HCL 1 MG/ML IJ SOLN
0.2500 mg | INTRAMUSCULAR | Status: DC | PRN
  Administered 2015-01-26 (×4): 0.5 mg via INTRAVENOUS

## 2015-01-26 MED ORDER — SODIUM CHLORIDE 0.9 % IJ SOLN
INTRAMUSCULAR | Status: DC | PRN
  Administered 2015-01-26: 30 mL via INTRAVENOUS

## 2015-01-26 MED ORDER — ROCURONIUM BROMIDE 100 MG/10ML IV SOLN
INTRAVENOUS | Status: DC | PRN
  Administered 2015-01-26: 10 mg via INTRAVENOUS
  Administered 2015-01-26 (×2): 5 mg via INTRAVENOUS
  Administered 2015-01-26: 50 mg via INTRAVENOUS

## 2015-01-26 MED ORDER — SODIUM CHLORIDE 0.9 % IJ SOLN
INTRAMUSCULAR | Status: AC
  Filled 2015-01-26: qty 10

## 2015-01-26 MED ORDER — BUPIVACAINE-EPINEPHRINE (PF) 0.5% -1:200000 IJ SOLN
INTRAMUSCULAR | Status: AC
  Filled 2015-01-26: qty 30

## 2015-01-26 MED ORDER — HYDROMORPHONE HCL 1 MG/ML IJ SOLN
0.5000 mg | INTRAMUSCULAR | Status: DC | PRN
  Administered 2015-01-26: 0.5 mg via INTRAVENOUS
  Filled 2015-01-26: qty 1

## 2015-01-26 MED ORDER — PRAVASTATIN SODIUM 20 MG PO TABS
20.0000 mg | ORAL_TABLET | Freq: Every day | ORAL | Status: DC
  Administered 2015-01-26 – 2015-01-27 (×2): 20 mg via ORAL
  Filled 2015-01-26 (×2): qty 1

## 2015-01-26 MED ORDER — VECURONIUM BROMIDE 10 MG IV SOLR
INTRAVENOUS | Status: AC
  Filled 2015-01-26: qty 10

## 2015-01-26 MED ORDER — PHENYLEPHRINE 40 MCG/ML (10ML) SYRINGE FOR IV PUSH (FOR BLOOD PRESSURE SUPPORT)
PREFILLED_SYRINGE | INTRAVENOUS | Status: AC
  Filled 2015-01-26: qty 10

## 2015-01-26 MED ORDER — ONDANSETRON HCL 4 MG/2ML IJ SOLN
INTRAMUSCULAR | Status: AC
  Filled 2015-01-26: qty 2

## 2015-01-26 MED ORDER — NORTRIPTYLINE HCL 25 MG PO CAPS
75.0000 mg | ORAL_CAPSULE | Freq: Every day | ORAL | Status: DC
  Administered 2015-01-26 – 2015-01-27 (×2): 75 mg via ORAL
  Filled 2015-01-26 (×3): qty 3

## 2015-01-26 MED ORDER — GABAPENTIN 300 MG PO CAPS
600.0000 mg | ORAL_CAPSULE | Freq: Every day | ORAL | Status: DC
  Administered 2015-01-27: 600 mg via ORAL
  Filled 2015-01-26 (×2): qty 2

## 2015-01-26 MED ORDER — ACETAMINOPHEN 10 MG/ML IV SOLN
INTRAVENOUS | Status: DC | PRN
  Administered 2015-01-26: 1000 mg via INTRAVENOUS

## 2015-01-26 MED ORDER — NEOSTIGMINE METHYLSULFATE 10 MG/10ML IV SOLN
INTRAVENOUS | Status: DC | PRN
  Administered 2015-01-26: 4 mg via INTRAVENOUS

## 2015-01-26 MED ORDER — GLYCOPYRROLATE 0.2 MG/ML IJ SOLN
INTRAMUSCULAR | Status: DC | PRN
  Administered 2015-01-26: .6 mg via INTRAVENOUS

## 2015-01-26 MED ORDER — DEXAMETHASONE SODIUM PHOSPHATE 10 MG/ML IJ SOLN
INTRAMUSCULAR | Status: DC | PRN
  Administered 2015-01-26: 4 mg via INTRAVENOUS

## 2015-01-26 MED ORDER — ACETAMINOPHEN 325 MG PO TABS: 650.0000 mg | ORAL_TABLET | Freq: Four times a day (QID) | ORAL | Status: DC | PRN

## 2015-01-26 MED ORDER — MIDAZOLAM HCL 2 MG/2ML IJ SOLN
INTRAMUSCULAR | Status: AC
  Filled 2015-01-26: qty 4

## 2015-01-26 MED ORDER — HYDRALAZINE HCL 20 MG/ML IJ SOLN
INTRAMUSCULAR | Status: DC | PRN
  Administered 2015-01-26 (×2): 5 mg via INTRAVENOUS

## 2015-01-26 MED ORDER — MENTHOL 3 MG MT LOZG: 1.0000 | LOZENGE | OROMUCOSAL | Status: DC | PRN

## 2015-01-26 MED ORDER — MIDAZOLAM HCL 5 MG/5ML IJ SOLN
INTRAMUSCULAR | Status: DC | PRN
  Administered 2015-01-26: 2 mg via INTRAVENOUS

## 2015-01-26 MED ORDER — HYDROMORPHONE HCL 1 MG/ML IJ SOLN
INTRAMUSCULAR | Status: AC
  Administered 2015-01-26: 0.5 mg via INTRAVENOUS
  Filled 2015-01-26: qty 1

## 2015-01-26 MED ORDER — GLYCOPYRROLATE 0.2 MG/ML IJ SOLN
INTRAMUSCULAR | Status: AC
  Filled 2015-01-26: qty 1

## 2015-01-26 MED ORDER — ONDANSETRON HCL 4 MG/2ML IJ SOLN
INTRAMUSCULAR | Status: DC | PRN
  Administered 2015-01-26: 4 mg via INTRAVENOUS

## 2015-01-26 MED ORDER — KETOROLAC TROMETHAMINE 15 MG/ML IJ SOLN
15.0000 mg | Freq: Four times a day (QID) | INTRAMUSCULAR | Status: AC
  Administered 2015-01-26 – 2015-01-27 (×4): 15 mg via INTRAVENOUS
  Filled 2015-01-26 (×3): qty 1

## 2015-01-26 MED ORDER — BUPIVACAINE-EPINEPHRINE (PF) 0.5% -1:200000 IJ SOLN
INTRAMUSCULAR | Status: DC | PRN
  Administered 2015-01-26: 30 mL

## 2015-01-26 MED ORDER — TRIAMTERENE-HCTZ 75-50 MG PO TABS
0.5000 | ORAL_TABLET | Freq: Every day | ORAL | Status: DC
  Filled 2015-01-26 (×3): qty 0.5

## 2015-01-26 MED ORDER — FENTANYL CITRATE (PF) 100 MCG/2ML IJ SOLN
INTRAMUSCULAR | Status: DC | PRN
  Administered 2015-01-26: 25 ug via INTRAVENOUS
  Administered 2015-01-26: 50 ug via INTRAVENOUS
  Administered 2015-01-26: 100 ug via INTRAVENOUS
  Administered 2015-01-26 (×4): 50 ug via INTRAVENOUS
  Administered 2015-01-26: 25 ug via INTRAVENOUS

## 2015-01-26 MED ORDER — ACETAMINOPHEN 650 MG RE SUPP: 650.0000 mg | Freq: Four times a day (QID) | RECTAL | Status: DC | PRN

## 2015-01-26 MED ORDER — SUCCINYLCHOLINE CHLORIDE 20 MG/ML IJ SOLN
INTRAMUSCULAR | Status: AC
  Filled 2015-01-26: qty 1

## 2015-01-26 MED ORDER — SENNA 8.6 MG PO TABS
2.0000 | ORAL_TABLET | Freq: Every day | ORAL | Status: DC
  Administered 2015-01-27: 17.2 mg via ORAL
  Filled 2015-01-26 (×2): qty 2

## 2015-01-26 MED ORDER — LABETALOL HCL 5 MG/ML IV SOLN
INTRAVENOUS | Status: DC | PRN
  Administered 2015-01-26 (×3): 5 mg via INTRAVENOUS

## 2015-01-26 MED ORDER — EPHEDRINE SULFATE 50 MG/ML IJ SOLN
INTRAMUSCULAR | Status: AC
  Filled 2015-01-26: qty 1

## 2015-01-26 MED ORDER — ESMOLOL HCL 100 MG/10ML IV SOLN
INTRAVENOUS | Status: DC | PRN
  Administered 2015-01-26: 10 mg via INTRAVENOUS

## 2015-01-26 MED ORDER — HYDROMORPHONE HCL 1 MG/ML IJ SOLN
INTRAMUSCULAR | Status: AC
  Filled 2015-01-26: qty 1

## 2015-01-26 MED ORDER — LACTATED RINGERS IV SOLN
INTRAVENOUS | Status: DC
  Administered 2015-01-26 (×3): via INTRAVENOUS

## 2015-01-26 MED ORDER — TRANEXAMIC ACID 1000 MG/10ML IV SOLN
1000.0000 mg | INTRAVENOUS | Status: DC
  Filled 2015-01-26: qty 10

## 2015-01-26 MED ORDER — DEXAMETHASONE SODIUM PHOSPHATE 10 MG/ML IJ SOLN
10.0000 mg | Freq: Once | INTRAMUSCULAR | Status: AC
  Administered 2015-01-27: 10 mg via INTRAVENOUS
  Filled 2015-01-26: qty 1

## 2015-01-26 MED ORDER — ESMOLOL HCL 100 MG/10ML IV SOLN
INTRAVENOUS | Status: AC
  Filled 2015-01-26: qty 10

## 2015-01-26 MED ORDER — ONDANSETRON HCL 4 MG/2ML IJ SOLN
4.0000 mg | Freq: Four times a day (QID) | INTRAMUSCULAR | Status: DC | PRN
  Administered 2015-01-26: 4 mg via INTRAVENOUS
  Filled 2015-01-26: qty 2

## 2015-01-26 MED ORDER — HYDRALAZINE HCL 20 MG/ML IJ SOLN
INTRAMUSCULAR | Status: AC
  Filled 2015-01-26: qty 1

## 2015-01-26 MED ORDER — POVIDONE-IODINE 7.5 % EX SOLN
CUTANEOUS | Status: DC | PRN
  Administered 2015-01-26: 1 via TOPICAL

## 2015-01-26 MED ORDER — METOCLOPRAMIDE HCL 5 MG/ML IJ SOLN: 5.0000 mg | Freq: Three times a day (TID) | INTRAMUSCULAR | Status: DC | PRN

## 2015-01-26 MED ORDER — SODIUM CHLORIDE 0.9 % IR SOLN
Status: DC | PRN
  Administered 2015-01-26: 3000 mL

## 2015-01-26 SURGICAL SUPPLY — 61 items
ACETAB SHELL MULTIHOLE 58 (Hips) ×2 IMPLANT
ADAPTER BIOLOX TAPER +3 (Miscellaneous) ×2 IMPLANT
BIT DRILL RINGLOC 3.2MMX40 (BIT) ×2 IMPLANT
BIT DRILL RINGLOC 3.2X40 (BIT) ×2
BLADE EXPLANT 54 LONG (BLADE) ×2 IMPLANT
BLADE EXPLANT 54 SHORT (BLADE) ×2 IMPLANT
BLADE SAW SGTL 18X1.27X75 (BLADE) ×2 IMPLANT
BLADE SURG ROTATE 9660 (MISCELLANEOUS) IMPLANT
CHLORAPREP W/TINT 26ML (MISCELLANEOUS) ×2 IMPLANT
COVER SURGICAL LIGHT HANDLE (MISCELLANEOUS) ×2 IMPLANT
DERMABOND ADVANCED (GAUZE/BANDAGES/DRESSINGS) ×2
DERMABOND ADVANCED .7 DNX12 (GAUZE/BANDAGES/DRESSINGS) ×2 IMPLANT
DRAPE C-ARM 42X72 X-RAY (DRAPES) ×2 IMPLANT
DRAPE IMP U-DRAPE 54X76 (DRAPES) ×4 IMPLANT
DRAPE STERI IOBAN 125X83 (DRAPES) ×2 IMPLANT
DRAPE U-SHAPE 47X51 STRL (DRAPES) ×6 IMPLANT
DRILL BIT RINGLOC 3.2MMX40 (BIT) ×2
DRSG AQUACEL AG ADV 3.5X10 (GAUZE/BANDAGES/DRESSINGS) ×2 IMPLANT
ELECT BLADE 4.0 EZ CLEAN MEGAD (MISCELLANEOUS) ×2
ELECT REM PT RETURN 9FT ADLT (ELECTROSURGICAL) ×2
ELECTRODE BLDE 4.0 EZ CLN MEGD (MISCELLANEOUS) ×1 IMPLANT
ELECTRODE REM PT RTRN 9FT ADLT (ELECTROSURGICAL) ×1 IMPLANT
EVACUATOR 1/8 PVC DRAIN (DRAIN) IMPLANT
GLOVE BIO SURGEON STRL SZ8.5 (GLOVE) ×4 IMPLANT
GLOVE BIOGEL PI IND STRL 8.5 (GLOVE) ×1 IMPLANT
GLOVE BIOGEL PI INDICATOR 8.5 (GLOVE) ×1
GOWN STRL REUS W/ TWL LRG LVL3 (GOWN DISPOSABLE) ×2 IMPLANT
GOWN STRL REUS W/TWL 2XL LVL3 (GOWN DISPOSABLE) ×2 IMPLANT
GOWN STRL REUS W/TWL LRG LVL3 (GOWN DISPOSABLE) ×2
HANDPIECE INTERPULSE COAX TIP (DISPOSABLE) ×1
HEAD CERAMIC BIOLOX 36MM (Head) ×2 IMPLANT
HOOD PEEL AWAY FACE SHEILD DIS (HOOD) ×4 IMPLANT
KIT BASIN OR (CUSTOM PROCEDURE TRAY) ×2 IMPLANT
KIT ROOM TURNOVER OR (KITS) ×2 IMPLANT
LINER ACETABULAR NEUTRAL 36MM (Hips) ×2 IMPLANT
MANIFOLD NEPTUNE II (INSTRUMENTS) ×2 IMPLANT
MARKER SKIN DUAL TIP RULER LAB (MISCELLANEOUS) ×2 IMPLANT
NEEDLE SPNL 18GX3.5 QUINCKE PK (NEEDLE) ×2 IMPLANT
NS IRRIG 1000ML POUR BTL (IV SOLUTION) ×2 IMPLANT
PACK TOTAL JOINT (CUSTOM PROCEDURE TRAY) ×2 IMPLANT
PACK UNIVERSAL I (CUSTOM PROCEDURE TRAY) ×2 IMPLANT
PAD ARMBOARD 7.5X6 YLW CONV (MISCELLANEOUS) ×4 IMPLANT
SCREW ACETABULAR G7 6.5X35MM (Hips) ×4 IMPLANT
SEALER BIPOLAR AQUA 6.0 (INSTRUMENTS) ×2 IMPLANT
SET HNDPC FAN SPRY TIP SCT (DISPOSABLE) ×1 IMPLANT
SHELL ACETAB MULTIHOLE 58 (Hips) ×1 IMPLANT
SUCTION FRAZIER TIP 10 FR DISP (SUCTIONS) ×2 IMPLANT
SUT ETHIBOND NAB CT1 #1 30IN (SUTURE) ×2 IMPLANT
SUT MNCRL AB 3-0 PS2 18 (SUTURE) ×2 IMPLANT
SUT MON AB 2-0 CT1 36 (SUTURE) ×2 IMPLANT
SUT VIC AB 1 CT1 27 (SUTURE) ×1
SUT VIC AB 1 CT1 27XBRD ANBCTR (SUTURE) ×1 IMPLANT
SUT VIC AB 2-0 CT1 27 (SUTURE) ×1
SUT VIC AB 2-0 CT1 TAPERPNT 27 (SUTURE) ×1 IMPLANT
SUT VLOC 180 0 24IN GS25 (SUTURE) ×2 IMPLANT
SYR 50ML LL SCALE MARK (SYRINGE) ×2 IMPLANT
SYRINGE 20CC LL (MISCELLANEOUS) ×2 IMPLANT
TOWEL OR 17X24 6PK STRL BLUE (TOWEL DISPOSABLE) ×2 IMPLANT
TOWEL OR 17X26 10 PK STRL BLUE (TOWEL DISPOSABLE) ×2 IMPLANT
TRAY FOLEY CATH 16FR SILVER (SET/KITS/TRAYS/PACK) IMPLANT
WATER STERILE IRR 1000ML POUR (IV SOLUTION) IMPLANT

## 2015-01-26 NOTE — Anesthesia Procedure Notes (Addendum)
Procedure Name: Intubation Date/Time: 01/26/2015 2:02 PM Performed by: Carney Living Pre-anesthesia Checklist: Patient identified, Emergency Drugs available, Suction available, Patient being monitored and Timeout performed Oxygen Delivery Method: Circle system utilized Preoxygenation: Pre-oxygenation with 100% oxygen Intubation Type: IV induction Ventilation: Mask ventilation without difficulty Laryngoscope Size: Mac and 4 Grade View: Grade I Tube type: Oral Number of attempts: 1 Airway Equipment and Method: Stylet Placement Confirmation: ETT inserted through vocal cords under direct vision,  positive ETCO2 and breath sounds checked- equal and bilateral Secured at: 21 cm Tube secured with: Tape Dental Injury: Teeth and Oropharynx as per pre-operative assessment  Comments: Intubation by Blossom Hoops, SRNA

## 2015-01-26 NOTE — Transfer of Care (Signed)
Immediate Anesthesia Transfer of Care Note  Patient: DEONE OMAHONEY  Procedure(s) Performed: Procedure(s): REVISION RIGHT TOTAL HIP ARTHROPLASTY ACETABULAR COMPONENTS  ANTERIOR APPROACH (Right)  Patient Location: PACU  Anesthesia Type:General  Level of Consciousness: sedated  Airway & Oxygen Therapy: Patient Spontanous Breathing and Patient connected to nasal cannula oxygen  Post-op Assessment: Report given to RN and Post -op Vital signs reviewed and stable  Post vital signs: Reviewed and stable  Last Vitals:  Filed Vitals:   01/26/15 1730  BP: 129/62  Pulse: 69  Temp: 36.4 C  Resp: 14    Complications: No apparent anesthesia complications   BP 374/82. VSS

## 2015-01-26 NOTE — Progress Notes (Signed)
Orthopedic Tech Progress Note Patient Details:  Felicia Little 16-Jun-1945 [id number]  Ortho Devices Ortho Device/Splint Location: applied ohf to bed Ortho Device/Splint Interventions: Ordered, Application   Braulio Bosch 01/26/2015, 9:18 PM

## 2015-01-26 NOTE — Progress Notes (Signed)
Patient unable to tolerate clear liquid at this time, complaints of nausea after trial of beef broth.  Unable to advance diet at the moment.  Will re evaluate.

## 2015-01-26 NOTE — Anesthesia Preprocedure Evaluation (Addendum)
Anesthesia Evaluation  Patient identified by MRN, date of birth, ID band Patient awake    Reviewed: Allergy & Precautions, NPO status , Patient's Chart, lab work & pertinent test results  Airway Mallampati: II  TM Distance: >3 FB Neck ROM: Full    Dental no notable dental hx. (+) Edentulous Upper, Dental Advisory Given   Pulmonary neg pulmonary ROS, former smoker,    Pulmonary exam normal breath sounds clear to auscultation       Cardiovascular hypertension, Pt. on medications and Pt. on home beta blockers Normal cardiovascular exam Rhythm:Regular Rate:Normal     Neuro/Psych  Headaches, Anxiety Depression negative neurological ROS  negative psych ROS   GI/Hepatic negative GI ROS, Neg liver ROS,   Endo/Other  negative endocrine ROS  Renal/GU negative Renal ROS  negative genitourinary   Musculoskeletal negative musculoskeletal ROS (+) Arthritis , Osteoarthritis,    Abdominal   Peds negative pediatric ROS (+)  Hematology negative hematology ROS (+)   Anesthesia Other Findings   Reproductive/Obstetrics negative OB ROS                            Anesthesia Physical Anesthesia Plan  ASA: II  Anesthesia Plan: General   Post-op Pain Management:    Induction: Intravenous  Airway Management Planned: Oral ETT  Additional Equipment:   Intra-op Plan:   Post-operative Plan: Extubation in OR  Informed Consent: I have reviewed the patients History and Physical, chart, labs and discussed the procedure including the risks, benefits and alternatives for the proposed anesthesia with the patient or authorized representative who has indicated his/her understanding and acceptance.   Dental advisory given  Plan Discussed with: CRNA, Surgeon and Anesthesiologist  Anesthesia Plan Comments:        Anesthesia Quick Evaluation

## 2015-01-26 NOTE — Brief Op Note (Signed)
01/26/2015  4:40 PM  PATIENT:  Felicia Little  69 y.o. female  PRE-OPERATIVE DIAGNOSIS:  right hip instability total hip arthroplasty  POST-OPERATIVE DIAGNOSIS:  same  PROCEDURE:  Procedure(s): REVISION RIGHT TOTAL HIP ARTHROPLASTY ACETABULAR COMPONENT ANTERIOR (Right)  SURGEON:  Surgeon(s) and Role:    * Rod Can, MD - Primary  PHYSICIAN ASSISTANT:   ASSISTANTS: Ky Barban, RNFA.   ANESTHESIA:   general  EBL:  Total I/O In: 1950 [I.V.:1950] Out: 400 [Blood:400]  BLOOD ADMINISTERED:none  DRAINS: none   LOCAL MEDICATIONS USED:  MARCAINE     SPECIMEN:  Aspirate  DISPOSITION OF SPECIMEN:  micro  COUNTS:  YES  TOURNIQUET:  * No tourniquets in log *  DICTATION: .Dragon Dictation and Other Dictation: Dictation Number [id number]  PLAN OF CARE: Admit to inpatient   PATIENT DISPOSITION:  PACU - hemodynamically stable.   Delay start of Pharmacological VTE agent (>24hrs) due to surgical blood loss or risk of bleeding: no

## 2015-01-26 NOTE — Progress Notes (Signed)
Utilization review completed.  

## 2015-01-26 NOTE — Anesthesia Postprocedure Evaluation (Signed)
  Anesthesia Post-op Note  Patient: Felicia Little  Procedure(s) Performed: Procedure(s) (LRB): REVISION RIGHT TOTAL HIP ARTHROPLASTY ACETABULAR COMPONENTS  ANTERIOR APPROACH (Right)  Patient Location: PACU  Anesthesia Type: General  Level of Consciousness: awake and alert   Airway and Oxygen Therapy: Patient Spontanous Breathing  Post-op Pain: mild  Post-op Assessment: Post-op Vital signs reviewed, Patient's Cardiovascular Status Stable, Respiratory Function Stable, Patent Airway and No signs of Nausea or vomiting  Last Vitals:  Filed Vitals:   01/26/15 1730  BP: 129/62  Pulse: 69  Temp: 36.4 C  Resp: 14    Post-op Vital Signs: stable   Complications: No apparent anesthesia complications

## 2015-01-26 NOTE — H&P (View-Only) (Signed)
TOTAL HIP REVISION ADMISSION H&P  Patient is admitted for right revision total hip arthroplasty.  Subjective:  Chief Complaint: right hip pain  HPI: Felicia Little, 69 y.o. female, has a history of pain and functional disability in the right hip due to arthritis and patient has failed non-surgical conservative treatments for greater than 12 weeks to include NSAID's and/or analgesics, flexibility and strengthening excercises, use of assistive devices, weight reduction as appropriate and activity modification. The indications for the revision total hip arthroplasty are instability due to malpositioned acetabular component.  Onset of symptoms was gradual starting 1 years ago with rapidlly worsening course since that time.  Prior procedures on the bilaterally hip include arthroplasty.  Patient currently rates pain in the right hip at 10 out of 10 with activity.  There is pain that interfers with activities of daily living and instability with motion. Patient has evidence of malpositioned acetabular component by imaging studies.  This condition presents safety issues increasing the risk of falls.  There is no current active infection.  Patient Active Problem List   Diagnosis Date Noted  . Hyperlipidemia 10/22/2014  . Primary osteoarthritis involving multiple joints 10/22/2014  . Tobacco abuse 10/22/2014  . Acute anxiety 10/21/2014  . Essential hypertension 10/21/2014  . Tendinitis of right shoulder 08/21/2013   Past Medical History  Diagnosis Date  . Hypertension   . Depression   . Anxiety   . Pneumonia ~ 1995; 1996  . Arthritis     "all over" (04/06/2012)  . Chronic lower back pain   . Kidney stones   . EXHBZJIR(678.9)     "maybe once/week" (04/06/2012) - none recently    Past Surgical History  Procedure Laterality Date  . Joint replacement      BIL  HIP  REPLACEMENTS   . Carpal tunnel release  1990's    "right" (04/06/2012)  . Knee arthroscopy  1990's    "right" (04/06/2012)  . Posterior  lumbar fusion  04/06/2012  . Cholecystectomy  1990's  . Total hip arthroplasty  2005; 2007    "right; left" (04/06/2012)  . Vaginal hysterectomy  1979  . 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 Smoking status: Former Smoker -- 0.50 packs/day for 33 years    Types: Cigarettes    Quit date: 05/02/2014  . Smokeless tobacco: Never Used  . Alcohol Use: No     Comment: 04/06/2012 "tried alcohol once; didn't like it; never tried it again"    Family History  Problem Relation Age of Onset  . Hypertension Daughter   . Heart attack Mother   . Alzheimer's disease Mother       Review of Systems  Constitutional: Negative.   HENT: Negative.   Eyes: Negative.   Respiratory: Negative.   Cardiovascular: Negative.   Gastrointestinal: Negative.   Genitourinary: Negative.   Musculoskeletal: Positive for back pain and joint pain.  Skin: Negative.   Neurological: Negative.   Endo/Heme/Allergies: Negative.   Psychiatric/Behavioral: Negative.     Objective:  Physical Exam  Vitals reviewed. Constitutional: She is oriented to person, place, and time. She appears well-developed and well-nourished.  HENT:  Head: Normocephalic and atraumatic.  Eyes: Conjunctivae  and EOM are normal. Pupils are equal, round, and reactive to light.  Neck: Normal range of motion. Neck supple.  Cardiovascular: Normal rate, regular rhythm, normal heart sounds and intact distal pulses.   Respiratory: Effort normal and breath sounds normal. No respiratory distress.  GI: Soft. Bowel sounds are normal. She exhibits no distension. There is no tenderness.  Genitourinary:  deferred  Musculoskeletal:        Right hip: She exhibits decreased range of motion.  incis healed. + instability with ROM.   Neurological: She is alert and oriented to person, place, and time. She has normal reflexes.  Skin: Skin is warm and dry.  Psychiatric: She has a normal mood and affect. Her behavior is normal. Judgment and thought content normal.    Vital signs in last 24 hours: @VSRANGES @   Labs:   Estimated body mass index is 29.19 kg/(m^2) as calculated from the following:   Height as of 01/15/15: 5' 5.5" (1.664 m).   Weight as of 01/15/15: 80.831 kg (178 lb 3.2 oz).  Imaging Review:  Plain radiographs demonstrate bilateral THAs. The bone quality appears to be adequate for age and reported activity level. There is vertical orientation of the right acetabular component.  Assessment/Plan:  End stage arthritis, right hip(s) with failed previous arthroplasty.  The patient history, physical examination, clinical judgement of the provider and imaging studies are consistent with end stage degenerative joint disease of the right hip(s), previous total hip arthroplasty. Revision total hip arthroplasty is deemed medically necessary. The treatment options including medical management, injection therapy, arthroscopy and arthroplasty were discussed at length. The risks and benefits of total hip arthroplasty were presented and reviewed. The risks due to aseptic loosening, infection, stiffness, dislocation/subluxation,  thromboembolic complications and other imponderables were discussed.  The patient acknowledged the explanation, agreed to proceed with the plan and consent was signed. Patient is being admitted for inpatient treatment for surgery, pain control, PT, OT, prophylactic antibiotics, VTE prophylaxis, progressive ambulation and ADL's and discharge planning. The patient is planning to be discharged home with home health services

## 2015-01-26 NOTE — Interval H&P Note (Signed)
History and Physical Interval Note:  01/26/2015 1:09 PM  Felicia Little [name]  has presented today for surgery, with the diagnosis of right hip instability total hip arthroplasty  The various methods of treatment have been discussed with the patient and family. After consideration of risks, benefits and other options for treatment, the patient has consented to  Procedure(s): [place] (Right) as a surgical intervention .  The patient's history has been reviewed, patient examined, no change in status, stable for surgery.  I have reviewed the patient's chart and labs.  Questions were answered to the patient's satisfaction.     Shaquon Gropp, Horald Pollen

## 2015-01-27 ENCOUNTER — Encounter (HOSPITAL_COMMUNITY): Payer: Self-pay | Admitting: Orthopedic Surgery

## 2015-01-27 LAB — CBC
HCT: 31.2 % — ABNORMAL LOW (ref 36.0–46.0)
HEMOGLOBIN: 10 g/dL — AB (ref 12.0–15.0)
MCH: 27.9 pg (ref 26.0–34.0)
MCHC: 32.1 g/dL (ref 30.0–36.0)
MCV: 86.9 fL (ref 78.0–100.0)
PLATELETS: 260 10*3/uL (ref 150–400)
RBC: 3.59 MIL/uL — AB (ref 3.87–5.11)
RDW: 13.8 % (ref 11.5–15.5)
WBC: 13.3 10*3/uL — AB (ref 4.0–10.5)

## 2015-01-27 LAB — BASIC METABOLIC PANEL
ANION GAP: 8 (ref 5–15)
BUN: 9 mg/dL (ref 6–20)
CALCIUM: 8 mg/dL — AB (ref 8.9–10.3)
CO2: 24 mmol/L (ref 22–32)
CREATININE: 0.94 mg/dL (ref 0.44–1.00)
Chloride: 103 mmol/L (ref 101–111)
GFR, EST NON AFRICAN AMERICAN: 60 mL/min — AB (ref >60)
GLUCOSE: 96 mg/dL (ref 65–99)
Potassium: 3.4 mmol/L — ABNORMAL LOW (ref 3.5–5.1)
SODIUM: 135 mmol/L (ref 135–145)

## 2015-01-27 MED ORDER — APIXABAN 2.5 MG PO TABS: 2.5000 mg | ORAL_TABLET | Freq: Two times a day (BID) | ORAL | Status: DC

## 2015-01-27 MED ORDER — DOCUSATE SODIUM 100 MG PO CAPS: 100.0000 mg | ORAL_CAPSULE | Freq: Two times a day (BID) | ORAL | Status: DC

## 2015-01-27 MED ORDER — ONDANSETRON HCL 4 MG PO TABS: 4.0000 mg | ORAL_TABLET | Freq: Four times a day (QID) | ORAL | Status: DC | PRN

## 2015-01-27 MED ORDER — HYDROCODONE-ACETAMINOPHEN 5-325 MG PO TABS: 1.0000 | ORAL_TABLET | ORAL | Status: DC | PRN

## 2015-01-27 MED ORDER — SENNA 8.6 MG PO TABS: 2.0000 | ORAL_TABLET | Freq: Every day | ORAL | Status: DC

## 2015-01-27 NOTE — Discharge Instructions (Signed)
°Dr. Brian Swinteck °Joint Replacement Specialist °[facility] °[address] °[place],  [address] °[phone] ° ° °TOTAL HIP REPLACEMENT POSTOPERATIVE DIRECTIONS ° ° ° °Hip Rehabilitation, Guidelines Following Surgery  ° °WEIGHT BEARING °Weight bearing as tolerated with assist device (walker, cane, etc) as directed, use it as long as suggested by your surgeon or therapist, typically at least 4-6 weeks. ° °The results of a hip operation are greatly improved after range of motion and muscle strengthening exercises. Follow all safety measures which are given to protect your hip. If any of these exercises cause increased pain or swelling in your joint, decrease the amount until you are comfortable again. Then slowly increase the exercises. Call your caregiver if you have problems or questions.  ° °HOME CARE INSTRUCTIONS  °Most of the following instructions are designed to prevent the dislocation of your new hip.  °Remove items at home which could result in a fall. This includes throw rugs or furniture in walking pathways.  °Continue medications as instructed at time of discharge. °· You may have some home medications which will be placed on hold until you complete the course of blood thinner medication. °· You may start showering once you are discharged home. Do not remove your dressing. °Do not put on socks or shoes without following the instructions of your caregivers.   °Sit on chairs with arms. Use the chair arms to help push yourself up when arising.  °Arrange for the use of a toilet seat elevator so you are not sitting low.  °· Walk with walker as instructed.  °You may resume a sexual relationship in one month or when given the OK by your caregiver.  °Use walker as long as suggested by your caregivers.  °You may put full weight on your legs and walk as much as is comfortable. °Avoid periods of inactivity such as sitting longer than an hour when not asleep. This helps prevent  blood clots.  °You may return to work once you are cleared by your surgeon.  °Do not drive a car for 6 weeks or until released by your surgeon.  °Do not drive while taking narcotics.  °Wear elastic stockings for two weeks following surgery during the day but you may remove then at night.  °Make sure you keep all of your appointments after your operation with all of your doctors and caregivers. You should call the office at the above phone number and make an appointment for approximately two weeks after the date of your surgery. °Please pick up a stool softener and laxative for home use as long as you are requiring pain medications. °· ICE to the affected hip every three hours for 30 minutes at a time and then as needed for pain and swelling. Continue to use ice on the hip for pain and swelling from surgery. You may notice swelling that will progress down to the foot and ankle.  This is normal after surgery.  Elevate the leg when you are not up walking on it.   °It is important for you to complete the blood thinner medication as prescribed by your doctor. °· Continue to use the breathing machine which will help keep your temperature down.  It is common for your temperature to cycle up and down following surgery, especially at night when you are not up moving around and exerting yourself.  The breathing machine keeps your lungs expanded and your temperature down. ° °RANGE OF MOTION AND STRENGTHENING EXERCISES  °These exercises are   designed to help you keep full movement of your hip joint. Follow your caregiver's or physical therapist's instructions. Perform all exercises about fifteen times, three times per day or as directed. Exercise both hips, even if you have had only one joint replacement. These exercises can be done on a training (exercise) mat, on the floor, on a table or on a bed. Use whatever works the best and is most comfortable for you. Use music or television while you are exercising so that the exercises  are a pleasant break in your day. This will make your life better with the exercises acting as a break in routine you can look forward to.  Lying on your back, slowly slide your foot toward your buttocks, raising your knee up off the floor. Then slowly slide your foot back down until your leg is straight again.  Lying on your back spread your legs as far apart as you can without causing discomfort.  Lying on your side, raise your upper leg and foot straight up from the floor as far as is comfortable. Slowly lower the leg and repeat.  Lying on your back, tighten up the muscle in the front of your thigh (quadriceps muscles). You can do this by keeping your leg straight and trying to raise your heel off the floor. This helps strengthen the largest muscle supporting your knee.  Lying on your back, tighten up the muscles of your buttocks both with the legs straight and with the knee bent at a comfortable angle while keeping your heel on the floor.   SKILLED REHAB INSTRUCTIONS: If the patient is transferred to a skilled rehab facility following release from the hospital, a list of the current medications will be sent to the facility for the patient to continue.  When discharged from the skilled rehab facility, please have the facility set up the patient's [place] prior to being released. Also, the skilled facility will be responsible for providing the patient with their medications at time of release from the facility to include their pain medication and their blood thinner medication. If the patient is still at the rehab facility at time of the two week follow up appointment, the skilled rehab facility will also need to assist the patient in arranging follow up appointment in our office and any transportation needs.  MAKE SURE YOU:  Understand these instructions.  Will watch your condition.  Will get help right away if you are not doing well or get worse.  Pick up stool softner and  laxative for home use following surgery while on pain medications. Do not remove your dressing. The dressing is waterproof--it is OK to take showers. Continue to use ice for pain and swelling after surgery. Do not use any lotions or creams on the incision until instructed by your surgeon. Total Hip Protocol.  Information on my medicine - ELIQUIS (apixaban)  This medication education was reviewed with me or my healthcare representative as part of my discharge preparation.  The pharmacist that spoke with me during my hospital stay was:  Tad Moore, [facility]  Why was Eliquis prescribed for you? Eliquis was prescribed for you to reduce the risk of blood clots forming after orthopedic surgery.    What do You need to know about Eliquis? Take your Eliquis TWICE DAILY - one tablet in the morning and one tablet in the evening with or without food.  It would be best to take the dose about the same time each day.  If you have difficulty swallowing the tablet whole please discuss with your pharmacist how to take the medication safely.  Take Eliquis exactly as prescribed by your doctor and DO NOT stop taking Eliquis without talking to the doctor who prescribed the medication.  Stopping without other medication to take the place of Eliquis may increase your risk of developing a clot.  After discharge, you should have regular check-up appointments with your healthcare provider that is prescribing your Eliquis.  What do you do if you miss a dose? If a dose of ELIQUIS is not taken at the scheduled time, take it as soon as possible on the same day and twice-daily administration should be resumed.  The dose should not be doubled to make up for a missed dose.  Do not take more than one tablet of ELIQUIS at the same time.  Important Safety Information A possible side effect of Eliquis is bleeding. You should call your healthcare provider right away if you experience any of the  following: ? Bleeding from an injury or your nose that does not stop. ? Unusual colored urine (red or dark brown) or unusual colored stools (red or black). ? Unusual bruising for unknown reasons. ? A serious fall or if you hit your head (even if there is no bleeding).  Some medicines may interact with Eliquis and might increase your risk of bleeding or clotting while on Eliquis. To help avoid this, consult your healthcare provider or pharmacist prior to using any new prescription or non-prescription medications, including herbals, vitamins, non-steroidal anti-inflammatory drugs (NSAIDs) and supplements.  This website has more information on Eliquis (apixaban): http://www.eliquis.com/eliquis/home

## 2015-01-27 NOTE — Discharge Summary (Signed)
Physician Discharge Summary  Patient ID: Felicia Little MRN: [id number] DOB/AGE: 71-Feb-1947 71 y.o.  Admit date: 01/26/2015 Discharge date: 01/28/2015  Admission Diagnoses:  Instability of prosthetic hip [facility])  Discharge Diagnoses:  Principal Problem:   Instability of prosthetic hip [facility])   Past Medical History  Diagnosis Date  . Hypertension   . Depression   . Anxiety   . Pneumonia ~ 1995; 1996  . Arthritis     "all over" (04/06/2012)  . Chronic lower back pain   . Kidney stones   . Headache(784.0)     "maybe once/week" (04/06/2012) - none recently    Surgeries: Procedure(s): REVISION RIGHT TOTAL HIP ARTHROPLASTY [place] on 01/26/2015   Consultants (if any):    Discharged Condition: Improved  Hospital Course: Felicia Little is an 69 y.o. female who was admitted 01/26/2015 with a diagnosis of Instability of prosthetic hip ([place]) and went to the operating room on 01/26/2015 and underwent the above named procedures.    She was given perioperative antibiotics:      Anti-infectives    Start     Dose/Rate Route Frequency Ordered Stop   01/26/15 2000  ceFAZolin (ANCEF) IVPB 2 g/50 mL premix     2 g 100 mL/hr over 30 Minutes Intravenous Every 6 hours 01/26/15 1910 01/27/15 0414   01/26/15 0600  ceFAZolin (ANCEF) IVPB 2 g/50 mL premix     2 g 100 mL/hr over 30 Minutes Intravenous On call to O.R. 01/25/15 1527 01/26/15 1425    .  She was given sequential compression devices, early ambulation, and apixaban for DVT prophylaxis.  She benefited maximally from the hospital stay and there were no complications.    Recent vital signs:  Filed Vitals:   01/28/15 0715  BP: 144/69  Pulse: 89  Temp: 99.2 F (37.3 C)  Resp: 20    Recent laboratory studies:  Lab Results  Component Value Date   HGB 10.0* 01/27/2015   HGB 13.0 01/15/2015   HGB 11.5* 04/09/2012   Lab Results  Component Value Date   WBC 13.3* 01/27/2015   PLT 260 01/27/2015    Lab Results  Component Value Date   INR 1.10 01/15/2015   Lab Results  Component Value Date   NA 135 01/27/2015   K 3.4* 01/27/2015   CL 103 01/27/2015   CO2 24 01/27/2015   BUN 9 01/27/2015   CREATININE 0.94 01/27/2015   GLUCOSE 96 01/27/2015    Discharge Medications:     Medication List    STOP taking these medications        traMADol-acetaminophen 37.5-325 MG tablet  Commonly known as:  ULTRACET      TAKE these medications        ALPRAZolam 0.5 MG dissolvable tablet  Commonly known as:  NIRAVAM  Take 1 tablet (0.5 mg total) by mouth 3 (three) times daily.     apixaban 2.5 MG Tabs tablet  Commonly known as:  ELIQUIS  Take 1 tablet (2.5 mg total) by mouth every 12 (twelve) hours.     atenolol 50 MG tablet  Commonly known as:  TENORMIN  Take 1 tablet (50 mg total) by mouth daily.     docusate sodium 100 MG capsule  Commonly known as:  COLACE  Take 1 capsule (100 mg total) by mouth 2 (two) times daily.     gabapentin 300 MG capsule  Commonly known as:  NEURONTIN  Take 2 capsules (600 mg total) by mouth  2 (two) times daily.     HYDROcodone-acetaminophen 5-325 MG tablet  Commonly known as:  NORCO  Take 1-2 tablets by mouth every 4 (four) hours as needed for moderate pain.     nortriptyline 75 MG capsule  Commonly known as:  PAMELOR  Take 1 capsule (75 mg total) by mouth at bedtime.     NUVAIL Soln  Apply to affected toenail(s) daily     ondansetron 4 MG tablet  Commonly known as:  ZOFRAN  Take 1 tablet (4 mg total) by mouth every 6 (six) hours as needed for nausea.     pravastatin 20 MG tablet  Commonly known as:  PRAVACHOL  Take 1 tablet (20 mg total) by mouth at bedtime.     senna 8.6 MG Tabs tablet  Commonly known as:  SENOKOT  Take 2 tablets (17.2 mg total) by mouth at bedtime.     triamterene-hydrochlorothiazide 75-50 MG tablet  Commonly known as:  MAXZIDE  Take 0.5 tablets by mouth daily.        Diagnostic Studies: Dg Pelvis  Portable  01/26/2015  CLINICAL DATA:  Postop, right hip arthroplasty EXAM: PORTABLE PELVIS 1-2 VIEWS COMPARISON:  11/26/2014 FINDINGS: Single portable view of the pelvis submitted. There is bilateral hip prosthesis with anatomic alignment. Postsurgical changes with periarticular air noted right hip joint. IMPRESSION: Bilateral hip prosthesis with anatomic alignment. Electronically Signed   By: Lahoma Crocker M.D.   On: 01/26/2015 18:25   Dg Hip Operative Unilat With Pelvis Right  01/26/2015  CLINICAL DATA:  Right hip arthroplasty revision EXAM: OPERATIVE RIGHT HIP WITH PELVIS COMPARISON:  None. FLUOROSCOPY TIME:  Radiation Exposure Index (as provided by the fluoroscopic device): Not available If the device does not provide the exposure index: Fluoroscopy Time:  52 seconds Number of Acquired Images:  2 FINDINGS: A right hip replacement is seen. No dislocation or acute bony abnormality is noted. No soft tissue changes are seen. IMPRESSION: Status post right hip replacement Electronically Signed   By: Inez Catalina M.D.   On: 01/26/2015 16:41    Disposition: 06-Home-Health Care Svc  Discharge Instructions    Call MD / Call 911    Complete by:  As directed   If you experience chest pain or shortness of breath, CALL 911 and be transported to the hospital emergency room.  If you develope a fever above 101 F, pus (white drainage) or increased drainage or redness at the wound, or calf pain, call your surgeon's office.     Constipation Prevention    Complete by:  As directed   Drink plenty of fluids.  Prune juice may be helpful.  You may use a stool softener, such as Colace (over the counter) 100 mg twice a day.  Use MiraLax (over the counter) for constipation as needed.     Diet - low sodium heart healthy    Complete by:  As directed      Driving restrictions    Complete by:  As directed   No driving for 6 weeks     Increase activity slowly as tolerated    Complete by:  As directed      Lifting  restrictions    Complete by:  As directed   No lifting for 6 weeks     TED hose    Complete by:  As directed   Use stockings (TED hose) for 2 weeks on both leg(s).  You may remove them at night for sleeping.  Follow-up Information    Follow up with Leannah Guse, Horald Pollen, MD. Schedule an appointment as soon as possible for a visit in 2 weeks.   Specialty:  Orthopedic Surgery   Why:  For wound re-check   Contact information:   [address] [address] [phone]        Signed: Elie Goody 01/28/2015, 7:41 AM

## 2015-01-27 NOTE — Progress Notes (Signed)
Physical Therapy Progress Note:    Pt with continued excellent mobility with increased gait and no assist needed for HEP this afternoon. Pt moving well with all questions answered. Will continue to follow.    01/27/15 1223  PT Visit Information  Assistance Needed +1  History of Present Illness pt admitted for R THa replacement from posterior to anterior approach. PMHx of bil THA, HTN, HLD, depression  Precautions  Precaution Comments no precautions  Restrictions  RLE Weight Bearing WBAT  Pain Assessment  Pain Assessment No/denies pain  Cognition  Arousal/Alertness Awake/alert  Behavior During Therapy WFL for tasks assessed/performed  Overall Cognitive Status Within Functional Limits for tasks assessed  Bed Mobility  General bed mobility comments EOB on arrival  Transfers  Overall transfer level Needs assistance  Transfers Sit to/from Stand  Sit to Stand Supervision  General transfer comment cues for hand placement and safety  Ambulation/Gait  Ambulation/Gait assistance Supervision  Ambulation Distance (Feet) 180 Feet  Assistive device Rolling walker (2 wheeled)  Gait Pattern/deviations Step-through pattern  General Gait Details cues for posture with decreased stance on RLE  Gait velocity interpretation Below normal speed for age/gender  Total Joint Exercises  Hip ABduction/ADduction AROM;15 reps;Right;Seated  Long Arc Quad 15 reps;AROM;Seated;Right  Marching in Standing Right;AROM;Seated;15 reps  PT - End of Session  Activity Tolerance Patient tolerated treatment well  Patient left in chair;with call bell/phone within reach  Nurse Communication Mobility status  PT - Assessment/Plan  PT Plan Current plan remains appropriate  Follow Up Recommendations Home health PT  PT Goal Progression  Progress towards PT goals Progressing toward goals  PT General Charges  $$ ACUTE PT VISIT 1 Procedure  PT Treatments  $Gait Training 8-22 mins  Elwyn Reach, [place]

## 2015-01-27 NOTE — Evaluation (Signed)
Occupational Therapy Evaluation Patient Details Name: Felicia Little MRN: [id number] DOB: 07/22/1945 Today's Date: 01/27/2015    History of Present Illness pt admitted for R THa replacement from posterior to anterior approach. PMHx of bil THA, HTN, HLD, depression   Clinical Impression   Pt reports she was independent with ADLs PTA. Currently pt is supervision overall with min A for LB ADLs. Pt was noted to be up in room attempting to walk without walker upon OT arrival, educated pt on need for supervision during mobility in room and calling for assistance; pt verbalized understanding. Pt attempting to ambulate without RW despite max verbal cues. Pt plan to d/c home with 24/7 supervision from family. Educated daughter on need for close supervision during ADLs and functional mobility upon return home; daughter verbalized understanding. RN and NT notified of pts impulsive behavior with transfers. All education complete, pt with no further questions or concerns for OT at this time. Recommending HHOT for follow up in order to maximize pts safety and independence with ADLs and functional mobility. All OT needs can be met at next venue, signing off at this time. Thank you for this referral.     Follow Up Recommendations  Home health OT;Supervision/Assistance - 24 hour    Equipment Recommendations  3 in 1 bedside comode    Recommendations for Other Services       Precautions / Restrictions Precautions Precaution Comments: no precautions Restrictions Weight Bearing Restrictions: Yes RLE Weight Bearing: Weight bearing as tolerated      Mobility Bed Mobility               General bed mobility comments: EOB upon arrival, in chair at end of session  Transfers Overall transfer level: Needs assistance Equipment used: Rolling walker (2 wheeled) Transfers: Sit to/from Stand Sit to Stand: Supervision         General transfer comment: Verbal cues for safety with RW, pt not following  direction despite max verbal cues    Balance Overall balance assessment: Needs assistance         Standing balance support: Bilateral upper extremity supported Standing balance-Leahy Scale: Poor Standing balance comment: RW for support                            ADL Overall ADL's : Needs assistance/impaired Eating/Feeding: Set up;Sitting   Grooming: Supervision/safety;Standing       Lower Body Bathing: Minimal assistance;Sit to/from stand       Lower Body Dressing: Minimal assistance;Sit to/from stand   Toilet Transfer: Ambulation;BSC;RW;Supervision/safety (BSC over toilet)           Functional mobility during ADLs: Supervision/safety;Rolling walker General ADL Comments: No family present during OT eval, daughter arrived at end of session. Educated pt on compensatory strategies for LB ADLs, use of 3 in 1, home safety, safety with RW, edema management techniques; pt verbalized understanding. Pt not demonstrating safety with RW despite verbal cues. Pt noted to be standing up in room attempting to walk without walker when therapist entered room. No chair alarms on floor at this time, RN and NT notifed (they OKed for pt to be in chair). Daughter present at end of session, reviewed need for close supervision during ADLs and fucntional mobility with pt and her daughter; they verbalized understanding. Reviewed using call bell for nursing to assist when pt needs to use bathroom or wants to go back to bed.      Vision       Perception     Praxis      Pertinent Vitals/Pain Pain Assessment: No/denies pain     Hand Dominance     Extremity/Trunk Assessment Upper Extremity Assessment Upper Extremity Assessment: Overall WFL for tasks assessed   Lower Extremity Assessment Lower Extremity Assessment: Defer to PT evaluation   Cervical / Trunk Assessment Cervical / Trunk Assessment: Normal   Communication Communication Communication: No difficulties   Cognition  Arousal/Alertness: Awake/alert Behavior During Therapy: Impulsive Overall Cognitive Status: No family/caregiver present to determine baseline cognitive functioning       Memory: Decreased short-term memory             General Comments       Exercises       Shoulder Instructions      Home Living Family/patient expects to be discharged to:: Private residence Living Arrangements: Alone Available Help at Discharge: Family;Available 24 hours/day Type of Home: Apartment Home Access: Level entry     Home Layout: One level     Bathroom Shower/Tub: Tub/shower unit   Bathroom Toilet: Standard Bathroom Accessibility: Yes How Accessible: Accessible via walker Home Equipment: Walker - 2 wheels;Shower seat          Prior Functioning/Environment Level of Independence: Independent             OT Diagnosis: Generalized weakness   OT Problem List:     OT Treatment/Interventions:      OT Goals(Current goals can be found in the care plan section) Acute Rehab OT Goals Patient Stated Goal: to go home tomorrow OT Goal Formulation: With patient  OT Frequency:     Barriers to D/C:            Co-evaluation              End of Session Equipment Utilized During Treatment: Rolling walker Nurse Communication: Other (comment) (pt up in room on her own, sitting in chair)  Activity Tolerance: Patient tolerated treatment well Patient left: in chair;with call bell/phone within reach;with family/visitor present   Time: 1346-1404 OT Time Calculation (min): 18 min Charges:  OT General Charges $OT Visit: 1 Procedure OT Evaluation $Initial OT Evaluation Tier I: 1 Procedure G-Codes:      A  M.S., OTR/L Pager: [phone]  01/27/2015, 2:19 PM    

## 2015-01-27 NOTE — Progress Notes (Signed)
OT Cancellation Note  Patient Details Name: Felicia Little MRN: [id number] DOB: Apr 17, 1945   Cancelled Treatment:    Reason Eval/Treat Not Completed: Patient declined, no reason specified. Pt reports that she did not get breakfast this morning, all she had was coffee. She worked with PT this morning and now feels weak and wants to rest until after she can get some food for lunch; pt declined OT eval at this time. Will check back as time allows for OT eval.   Binnie Kand M.S., OTR/L Pager: [phone]  01/27/2015, 11:08 AM

## 2015-01-27 NOTE — Evaluation (Addendum)
Physical Therapy Evaluation Patient Details Name: Felicia Little MRN: [id number] DOB: 02/10/1946 Today's Date: 01/27/2015   History of Present Illness  pt admitted for R THa replacement from posterior to anterior approach. PMHx of bil THA, HTN, HLD, depression  Clinical Impression  Pt very pleasant and moving well with ambulation and mobility today. Pt able to stand and pivot as well as walk in hallway and perform HEP. Pt educated for WBAT and no precautions with pt agreeable to plan and will have assist of family for D/C. Pt will benefit from acute therapy to maximize mobility, gait and strength to decrease burden of care and return pt to PLOF.     Follow Up Recommendations Home health PT    Equipment Recommendations  3in1 (PT)    Recommendations for Other Services       Precautions / Restrictions Precautions Precaution Comments: no precautions Restrictions Weight Bearing Restrictions: Yes RLE Weight Bearing: Weight bearing as tolerated      Mobility  Bed Mobility Overal bed mobility: Modified Independent             General bed mobility comments: with rail and increased time  Transfers Overall transfer level: Needs assistance   Transfers: Sit to/from Stand;Stand Pivot Transfers Sit to Stand: Min guard Stand pivot transfers: Min assist       General transfer comment: cues for hand placement and safety  Ambulation/Gait Ambulation/Gait assistance: Supervision Ambulation Distance (Feet): 100 Feet Assistive device: Rolling walker (2 wheeled) Gait Pattern/deviations: Step-to pattern;Decreased stride length   Gait velocity interpretation: Below normal speed for age/gender General Gait Details: cues for posture with decreased stance on RLE  Stairs            Wheelchair Mobility    Modified Rankin (Stroke Patients Only)       Balance                                             Pertinent Vitals/Pain Pain Assessment: No/denies pain     Home Living Family/patient expects to be discharged to:: Private residence Living Arrangements: Alone Available Help at Discharge: Family;Available 24 hours/day Type of Home: Apartment Home Access: Level entry     Home Layout: One level Home Equipment: Walker - 2 wheels      Prior Function Level of Independence: Independent               Hand Dominance        Extremity/Trunk Assessment   Upper Extremity Assessment: Overall WFL for tasks assessed           Lower Extremity Assessment: RLE deficits/detail RLE Deficits / Details: decreased strength and ROM as expected post op    Cervical / Trunk Assessment: Normal  Communication   Communication: No difficulties  Cognition Arousal/Alertness: Awake/alert Behavior During Therapy: WFL for tasks assessed/performed Overall Cognitive Status: Within Functional Limits for tasks assessed                      General Comments      Exercises Total Joint Exercises Hip ABduction/ADduction: AAROM;Seated;Right;15 reps Long Arc Quad: AROM;Seated;Right;15 reps Marching in Standing: AAROM;Seated;Right;15 reps      Assessment/Plan    PT Assessment Patient needs continued PT services  PT Diagnosis Difficulty walking   PT Problem List Decreased strength;Decreased range of motion;Decreased activity tolerance;Decreased knowledge of use of DME;Decreased  mobility  PT Treatment Interventions DME instruction;Gait training;Functional mobility training;Therapeutic activities;Therapeutic exercise;Patient/family education   PT Goals (Current goals can be found in the Care Plan section) Acute Rehab PT Goals Patient Stated Goal: return home PT Goal Formulation: With patient/family Time For Goal Achievement: 02/10/15 Potential to Achieve Goals: Good    Frequency 7X/week   Barriers to discharge        Co-evaluation               End of Session   Activity Tolerance: Patient tolerated treatment well Patient  left: in chair;with call bell/phone within reach;with family/visitor present Nurse Communication: Mobility status;Weight bearing status         Time: 1594-7076 PT Time Calculation (min) (ACUTE ONLY): 23 min   Charges:   PT Evaluation $Initial PT Evaluation Tier I: 1 Procedure PT Treatments $Gait Training: 8-22 mins   PT G CodesMelford Aase 01/27/2015, 9:04 AM Elwyn Reach, [place]

## 2015-01-27 NOTE — Op Note (Signed)
NAMENATANE, HEWARD NO.:  [id number]  MEDICAL RECORD NO.:  [id number]  LOCATION:  5N12C                        FACILITY:  [facility]  PHYSICIAN:  Rod Can, MD     DATE OF BIRTH:  1945-04-28  DATE OF PROCEDURE:  01/26/2015 DATE OF DISCHARGE:                              OPERATIVE REPORT   PREOPERATIVE DIAGNOSIS:  Failed right total hip arthroplasty.  POSTOPERATIVE DIAGNOSIS:  Failed right total hip arthroplasty.  PROCEDURE PERFORMED:  Revision right of total hip arthroplasty, acetabular component   EXPLANTS:  Biomet RingLoc cup size 54 mm with 10-degree elevated rim liner and 32 standard metal head ball.  IMPLANTS: 1. Biomet G7 OsseoTi acetabular shell multi-hole size 58 mm with 6.5-     mm cancellous bone screws x2. 2. G7 acetabular liner E1, 36-mm neutral. 3. Biolox 36-mm ceramic head with +3 mm tapered adapter.  ANESTHESIA:  General.  EBL:  400 mL.  COMPLICATIONS:  None.  SPECIMENS:  Right hip fluid aspirate for culture.  DRAINS:  None.  COMPLICATIONS:  None.  DISPOSITION:  Stable to PACU.  INDICATIONS:  The patient is a 69 year old female, who underwent primary right total hip arthroplasty on May 24, 2003, by Dr. Patria Mane.  The patient has had progressive instability of the right hip associated with pain.  I saw her in the office.  X-rays revealed retroverted acetabular component.  I obtained inflammatory markers and her C-reactive protein was mildly elevated.  I sent her for a fluoro- guided aspiration which essentially yielded a dry tap.  Sterile saline was flushed through the hip by the radiologist and the culture was negative.  She was therefore indicated for revision of the acetabular component of her right total hip arthroplasty.  The risks, benefits, and alternatives were explained.  She elected to proceed.  DESCRIPTION OF PROCEDURE IN DETAIL:  The patient was correctly identified in the holding area using 2  identifiers.  I marked the surgical site.  The patient was taken to the operating room, and general anesthesia was induced on her bed.  She was then transferred to the [facility] table.  The right hip was prepped and draped in normal sterile surgical fashion.  Time-out was called verifying side and site of surgery.  She did receive 2 g of IV Ancef within 60 minutes of beginning the procedure.  I made a standard anterior approach to the hip.  I incised the fascia over the TFL, and the TFL was retracted laterally.  I came down upon the lateral femoral circumflex vessels, which were treated with the Aquamantys bipolar sealer.  Retractors were placed extra capsular, and then I used an 18-gauge spinal needle to aspirate the hip. I was able to obtain a couple drops of clear joint fluid, and this was sent for culture.  I then made an anterior capsulectomy, and there was no gross evidence of infection.  No purulence.  Traction was applied to the leg.  I used a bone tamp to loosen head ball.  I then removed the head, and this was then passed to the back table.  I placed acetabular retractors, and then I cleaned the periphery of the cup.  I made a small capsulotomy of the posterior capsule, and then I placed the trunnion posterior to the acetabular socket.  I used a quarter-inch osteotome to loosen the polyethylene liner, which was sent off the back table as a specimen.  I cleaned the implant-bone interface with rongeurs and Bovie electrocautery.  I then removed the 2 cancellous bone screws, and I placed a trial liner.  Visual inspection of the cup revealed the cup was vertical and retroverted probably at least 25 degrees.  I then used the explant system to loosen the cup, and I easily removed the cup with essentially no bone loss.  A 54-mm cup was retrieved.  I then reamed with a 55 and then 57-mm reamer under live fluoro.  I then placed the real 58- mm cup, she had excellent bony stability, and I chose  to augment this with two 6.5-mm cancellous screws.  I then placed the real 36 neutral polyethylene liner.  I then placed a 36+ 3 trial head ball and the hip was reduced.  Fluoroscopy was used to confirm leg lengths.  I performed stability testing at 90 degrees rotation with the leg in extension.  I also flexed the hip up to 90 degrees and with maximal internal rotation, there was no impingement or dislocation.  I then removed the trial head ball.  I copiously irrigated the wound with saline.  The trunnion was dried with a clean lap and then I impacted the 36-mm Biolox head on the +3 spacer.  The hip was then reduced the final time, stability testing was performed again and again and there was no change.  The wound was then copiously irrigated with dilute Betadine solution followed by sterile saline with pulse lavage.  I closed the wound in layers using #0 Vicryl for the fascia, 2-0 Vicryl for the deep fat, 2-0 Monocryl for the deep dermal layer, and running Monocryl subcuticular stitch.  Dermabond was applied to the skin.  Once the glue was fully dried, an Aquacel dressing was placed.  The patient was then extubated and transferred to her bed and taken to PACU in stable condition.  Sponge, needle, and instrument counts were correct at the end of the case x2.  There were no known complications.  Operative events and findings were discussed with the patient's family. Due to her excellent bony stability, I will allow her to weight bear as tolerated with a walker.  I plan to keep her on a walker for 6 weeks.  I will put her on Eliquis for DVT prophylaxis.  She worked with physical and occupational therapy.  All questions solicited and answered to her satisfaction.          ______________________________ Rod Can, MD     BS/MEDQ  D:  01/26/2015  T:  01/27/2015  Job:  [id number]

## 2015-01-27 NOTE — Progress Notes (Signed)
   Subjective:  Patient reports pain as mild to moderate.  No c/o. Denies N/V/CP/SOB.  Objective:   VITALS:   Filed Vitals:   01/26/15 2005 01/26/15 2008 01/27/15 0230 01/27/15 0500  BP: 121/63  116/67 158/70  Pulse: 77  83 88  Temp: 97.5 F (36.4 C)  98.7 F (37.1 C) 99.1 F (37.3 C)  TempSrc: Axillary  Oral Oral  Resp: 18  20 20   Height: 5\' 5"  (1.651 m)     Weight: 81.9 kg (180 lb 8.9 oz)     SpO2: 99% 99% 100% 97%    ABD soft Sensation intact distally Intact pulses distally Dorsiflexion/Plantar flexion intact Incision: dressing C/D/I Compartment soft   Lab Results  Component Value Date   WBC 8.8 01/15/2015   HGB 13.0 01/15/2015   HCT 40.5 01/15/2015   MCV 87.1 01/15/2015   PLT 337 01/15/2015   BMET    Component Value Date/Time   NA 137 01/15/2015 1509   NA 140 10/23/2014 1422   K 4.1 01/15/2015 1509   CL 104 01/15/2015 1509   CO2 26 01/15/2015 1509   GLUCOSE 93 01/15/2015 1509   GLUCOSE 87 10/23/2014 1422   BUN 8 01/15/2015 1509   BUN 9 10/23/2014 1422   CREATININE 0.99 01/15/2015 1509   CALCIUM 9.3 01/15/2015 1509   GFRNONAA 56* 01/15/2015 1509   GFRAA >60 01/15/2015 1509     Assessment/Plan: 1 Day Post-Op   Active Problems:   Instability of prosthetic hip (HCC)   WBAT with walker IV abx x23 hrs DVT ppx: apixaban, SCDs, TEDs PO pain control PT/OT Follow intraop cultures: gram stain (-), NGTD Dispo: likely d/c home tomorrow    Felicia Little 01/27/2015, 7:16 AM   Rod Can, MD Cell [phone]

## 2015-01-28 ENCOUNTER — Encounter (HOSPITAL_COMMUNITY): Payer: Self-pay | Admitting: Orthopedic Surgery

## 2015-01-28 LAB — CBC
HCT: 29.2 % — ABNORMAL LOW (ref 36.0–46.0)
HEMOGLOBIN: 9.4 g/dL — AB (ref 12.0–15.0)
MCH: 28.1 pg (ref 26.0–34.0)
MCHC: 32.2 g/dL (ref 30.0–36.0)
MCV: 87.4 fL (ref 78.0–100.0)
Platelets: 216 10*3/uL (ref 150–400)
RBC: 3.34 MIL/uL — AB (ref 3.87–5.11)
RDW: 14 % (ref 11.5–15.5)
WBC: 18.5 10*3/uL — AB (ref 4.0–10.5)

## 2015-01-28 NOTE — Discharge Planning (Signed)
IV previously removed. Discharge instructions and prescriptions given to the patient and her daughter. No questions or concerns at this time. Patient discharged to home. Taken out via [employer]. Explained to patient about physical and occupational therapy and explained she has a right to refuse.

## 2015-01-28 NOTE — Progress Notes (Signed)
Physical Therapy Treatment Patient Details Name: GENOLA YUILLE MRN: [id number] DOB: 1945/11/14 Today's Date: 01/28/2015    History of Present Illness pt admitted for R THa replacement from posterior to anterior approach. PMHx of bil THA, HTN, HLD, depression    PT Comments    Pt ambulating with RW 250' mod I, also performing bed mobility and transfers without physical assist. Pt feels prepared for d/c home later today. Will be followed by HHPT.  Follow Up Recommendations  Home health PT     Equipment Recommendations  3in1 (PT)    Recommendations for Other Services       Precautions / Restrictions Precautions Precautions: None Restrictions Weight Bearing Restrictions: Yes RLE Weight Bearing: Weight bearing as tolerated    Mobility  Bed Mobility Overal bed mobility: Modified Independent                Transfers Overall transfer level: Modified independent Equipment used: Rolling walker (2 wheeled) Transfers: Sit to/from Stand Sit to Stand: Modified independent (Device/Increase time)         General transfer comment: pt stood safely today without vc's, improvement from yesterday  Ambulation/Gait Ambulation/Gait assistance: Modified independent (Device/Increase time) Ambulation Distance (Feet): 250 Feet Assistive device: Rolling walker (2 wheeled) Gait Pattern/deviations: Step-through pattern;Decreased stride length Gait velocity: decreased Gait velocity interpretation: Below normal speed for age/gender General Gait Details: pt with symmetrical pattern today, reports that he RLE is now "awake" and hurts much worse but was still able to tolerate increased ambulation distance   Stairs            Wheelchair Mobility    Modified Rankin (Stroke Patients Only)       Balance Overall balance assessment: Needs assistance Sitting-balance support: No upper extremity supported Sitting balance-Leahy Scale: Good     Standing balance support: No upper  extremity supported Standing balance-Leahy Scale: Good Standing balance comment: pt standing without RW mod I emphasized to pt though that she is to use RW consistently for now and wean with HHPT                    Cognition Arousal/Alertness: Awake/alert Behavior During Therapy: WFL for tasks assessed/performed Overall Cognitive Status: Within Functional Limits for tasks assessed                      Exercises Total Joint Exercises Ankle Circles/Pumps: AROM;Both;10 reps;Seated Quad Sets: AROM;Both;10 reps;Seated Gluteal Sets: AROM;Both;10 reps;Seated Heel Slides: AROM;Right;10 reps;Seated Hip ABduction/ADduction: AROM;Right;10 reps;Seated Straight Leg Raises: AAROM;Right;10 reps;Seated Long Arc Quad: AROM;Right;10 reps;Seated    General Comments General comments (skin integrity, edema, etc.): discussed car transfer and d/c issues      Pertinent Vitals/Pain Pain Assessment: 0-10 Pain Score: 6  Pain Location: right hip Pain Descriptors / Indicators: Aching Pain Intervention(s): Monitored during session;Premedicated before session    Home Living                      Prior Function            PT Goals (current goals can now be found in the care plan section) Acute Rehab PT Goals Patient Stated Goal: d/c PT Goal Formulation: With patient/family Time For Goal Achievement: 02/10/15 Potential to Achieve Goals: Good Progress towards PT goals: Progressing toward goals    Frequency  7X/week    PT Plan Current plan remains appropriate    Co-evaluation  End of Session Equipment Utilized During Treatment: Gait belt Activity Tolerance: Patient tolerated treatment well Patient left: in chair;with call bell/phone within reach;with family/visitor present     Time: 8346-2194 PT Time Calculation (min) (ACUTE ONLY): 27 min  Charges:  $Gait Training: 23-37 mins                    G Codes:     Leighton Roach, PT  Acute Rehab  Services  [place], [place] 01/28/2015, 1:55 PM

## 2015-01-28 NOTE — Progress Notes (Signed)
   Subjective:  Patient reports pain as mild to moderate.  No c/o. Denies N/V/CP/SOB. Did well with PT/OT yesterday.  Objective:   VITALS:   Filed Vitals:   01/27/15 0500 01/27/15 1300 01/27/15 2227 01/28/15 0715  BP: 158/70 140/64 177/78 144/69  Pulse: 88 86 91 89  Temp: 99.1 F (37.3 C) 97.9 F (36.6 C) 99 F (37.2 C) 99.2 F (37.3 C)  TempSrc: Oral Oral Oral Oral  Resp: 20 20 20 20   Height:      Weight:      SpO2: 97% 98% 100% 96%    ABD soft Sensation intact distally Intact pulses distally Dorsiflexion/Plantar flexion intact Incision: dressing C/D/I Compartment soft   Lab Results  Component Value Date   WBC 13.3* 01/27/2015   HGB 10.0* 01/27/2015   HCT 31.2* 01/27/2015   MCV 86.9 01/27/2015   PLT 260 01/27/2015   BMET    Component Value Date/Time   NA 135 01/27/2015 0614   NA 140 10/23/2014 1422   K 3.4* 01/27/2015 0614   CL 103 01/27/2015 0614   CO2 24 01/27/2015 0614   GLUCOSE 96 01/27/2015 0614   GLUCOSE 87 10/23/2014 1422   BUN 9 01/27/2015 0614   BUN 9 10/23/2014 1422   CREATININE 0.94 01/27/2015 0614   CALCIUM 8.0* 01/27/2015 0614   GFRNONAA 60* 01/27/2015 0614   GFRAA >60 01/27/2015 0614     Assessment/Plan: 2 Days Post-Op   Principal Problem:   Instability of prosthetic hip ([place])   WBAT with walker DVT ppx: apixaban, SCDs, TEDs PO pain control PT/OT Follow intraop cultures: gram stain (-), NGTD Dispo: d/c home    Addeline Calarco, Horald Pollen 01/28/2015, 7:40 AM   Rod Can, MD Cell [phone]

## 2015-01-28 NOTE — Plan of Care (Signed)
Problem: Consults Goal: Diagnosis- Total Joint Replacement Outcome: Completed/Met Date Met:  01/28/15 Revision Total Hip Right

## 2015-01-29 DIAGNOSIS — F419 Anxiety disorder, unspecified: Secondary | ICD-10-CM | POA: Diagnosis not present

## 2015-01-29 DIAGNOSIS — G8929 Other chronic pain: Secondary | ICD-10-CM | POA: Diagnosis not present

## 2015-01-29 DIAGNOSIS — Z4732 Aftercare following explantation of hip joint prosthesis: Secondary | ICD-10-CM | POA: Diagnosis not present

## 2015-01-29 DIAGNOSIS — M150 Primary generalized (osteo)arthritis: Secondary | ICD-10-CM | POA: Diagnosis not present

## 2015-01-29 DIAGNOSIS — F329 Major depressive disorder, single episode, unspecified: Secondary | ICD-10-CM | POA: Diagnosis not present

## 2015-01-29 DIAGNOSIS — I10 Essential (primary) hypertension: Secondary | ICD-10-CM | POA: Diagnosis not present

## 2015-01-30 DIAGNOSIS — G8929 Other chronic pain: Secondary | ICD-10-CM | POA: Diagnosis not present

## 2015-01-30 DIAGNOSIS — M150 Primary generalized (osteo)arthritis: Secondary | ICD-10-CM | POA: Diagnosis not present

## 2015-01-30 DIAGNOSIS — F329 Major depressive disorder, single episode, unspecified: Secondary | ICD-10-CM | POA: Diagnosis not present

## 2015-01-30 DIAGNOSIS — F419 Anxiety disorder, unspecified: Secondary | ICD-10-CM | POA: Diagnosis not present

## 2015-01-30 DIAGNOSIS — Z4732 Aftercare following explantation of hip joint prosthesis: Secondary | ICD-10-CM | POA: Diagnosis not present

## 2015-01-30 DIAGNOSIS — I10 Essential (primary) hypertension: Secondary | ICD-10-CM | POA: Diagnosis not present

## 2015-01-30 LAB — BODY FLUID CULTURE: CULTURE: NO GROWTH

## 2015-01-31 DIAGNOSIS — M150 Primary generalized (osteo)arthritis: Secondary | ICD-10-CM | POA: Diagnosis not present

## 2015-01-31 DIAGNOSIS — I10 Essential (primary) hypertension: Secondary | ICD-10-CM | POA: Diagnosis not present

## 2015-01-31 DIAGNOSIS — F329 Major depressive disorder, single episode, unspecified: Secondary | ICD-10-CM | POA: Diagnosis not present

## 2015-01-31 DIAGNOSIS — F419 Anxiety disorder, unspecified: Secondary | ICD-10-CM | POA: Diagnosis not present

## 2015-01-31 DIAGNOSIS — Z4732 Aftercare following explantation of hip joint prosthesis: Secondary | ICD-10-CM | POA: Diagnosis not present

## 2015-01-31 DIAGNOSIS — G8929 Other chronic pain: Secondary | ICD-10-CM | POA: Diagnosis not present

## 2015-01-31 LAB — ANAEROBIC CULTURE

## 2015-02-03 DIAGNOSIS — G8929 Other chronic pain: Secondary | ICD-10-CM | POA: Diagnosis not present

## 2015-02-03 DIAGNOSIS — Z4732 Aftercare following explantation of hip joint prosthesis: Secondary | ICD-10-CM | POA: Diagnosis not present

## 2015-02-03 DIAGNOSIS — I10 Essential (primary) hypertension: Secondary | ICD-10-CM | POA: Diagnosis not present

## 2015-02-03 DIAGNOSIS — M150 Primary generalized (osteo)arthritis: Secondary | ICD-10-CM | POA: Diagnosis not present

## 2015-02-03 DIAGNOSIS — F419 Anxiety disorder, unspecified: Secondary | ICD-10-CM | POA: Diagnosis not present

## 2015-02-03 DIAGNOSIS — F329 Major depressive disorder, single episode, unspecified: Secondary | ICD-10-CM | POA: Diagnosis not present

## 2015-02-05 ENCOUNTER — Encounter (HOSPITAL_COMMUNITY): Payer: Self-pay | Admitting: Orthopedic Surgery

## 2015-02-05 DIAGNOSIS — I10 Essential (primary) hypertension: Secondary | ICD-10-CM | POA: Diagnosis not present

## 2015-02-05 DIAGNOSIS — F419 Anxiety disorder, unspecified: Secondary | ICD-10-CM | POA: Diagnosis not present

## 2015-02-05 DIAGNOSIS — F329 Major depressive disorder, single episode, unspecified: Secondary | ICD-10-CM | POA: Diagnosis not present

## 2015-02-05 DIAGNOSIS — Z4732 Aftercare following explantation of hip joint prosthesis: Secondary | ICD-10-CM | POA: Diagnosis not present

## 2015-02-05 DIAGNOSIS — M150 Primary generalized (osteo)arthritis: Secondary | ICD-10-CM | POA: Diagnosis not present

## 2015-02-05 DIAGNOSIS — G8929 Other chronic pain: Secondary | ICD-10-CM | POA: Diagnosis not present

## 2015-02-11 DIAGNOSIS — Z96641 Presence of right artificial hip joint: Secondary | ICD-10-CM | POA: Diagnosis not present

## 2015-02-11 DIAGNOSIS — Z471 Aftercare following joint replacement surgery: Secondary | ICD-10-CM | POA: Diagnosis not present

## 2015-03-11 DIAGNOSIS — Z96641 Presence of right artificial hip joint: Secondary | ICD-10-CM | POA: Diagnosis not present

## 2015-03-11 DIAGNOSIS — Z471 Aftercare following joint replacement surgery: Secondary | ICD-10-CM | POA: Diagnosis not present

## 2015-04-22 DIAGNOSIS — Z96641 Presence of right artificial hip joint: Secondary | ICD-10-CM | POA: Diagnosis not present

## 2015-04-22 DIAGNOSIS — M1711 Unilateral primary osteoarthritis, right knee: Secondary | ICD-10-CM | POA: Diagnosis not present

## 2015-04-22 DIAGNOSIS — Z471 Aftercare following joint replacement surgery: Secondary | ICD-10-CM | POA: Diagnosis not present

## 2015-04-23 ENCOUNTER — Ambulatory Visit (INDEPENDENT_AMBULATORY_CARE_PROVIDER_SITE_OTHER): Payer: Medicare Other | Admitting: Family Medicine

## 2015-04-23 ENCOUNTER — Encounter: Payer: Self-pay | Admitting: Family Medicine

## 2015-04-23 ENCOUNTER — Ambulatory Visit: Payer: Medicare Other | Admitting: Family Medicine

## 2015-04-23 VITALS — BP 128/82 | HR 71 | Temp 98.5°F | Resp 16 | Ht 66.0 in | Wt 176.0 lb

## 2015-04-23 DIAGNOSIS — T84029D Dislocation of unspecified internal joint prosthesis, subsequent encounter: Secondary | ICD-10-CM

## 2015-04-23 DIAGNOSIS — Z9660 Presence of unspecified orthopedic joint implant: Secondary | ICD-10-CM

## 2015-04-23 DIAGNOSIS — F419 Anxiety disorder, unspecified: Secondary | ICD-10-CM | POA: Diagnosis not present

## 2015-04-23 DIAGNOSIS — F418 Other specified anxiety disorders: Secondary | ICD-10-CM | POA: Diagnosis not present

## 2015-04-23 DIAGNOSIS — G8929 Other chronic pain: Secondary | ICD-10-CM

## 2015-04-23 DIAGNOSIS — E785 Hyperlipidemia, unspecified: Secondary | ICD-10-CM

## 2015-04-23 DIAGNOSIS — Z96649 Presence of unspecified artificial hip joint: Secondary | ICD-10-CM

## 2015-04-23 DIAGNOSIS — I10 Essential (primary) hypertension: Secondary | ICD-10-CM | POA: Diagnosis not present

## 2015-04-23 DIAGNOSIS — Z720 Tobacco use: Secondary | ICD-10-CM | POA: Diagnosis not present

## 2015-04-23 DIAGNOSIS — T84028D Dislocation of other internal joint prosthesis, subsequent encounter: Secondary | ICD-10-CM

## 2015-04-23 MED ORDER — VARENICLINE TARTRATE 1 MG PO TABS: 1.0000 mg | ORAL_TABLET | Freq: Two times a day (BID) | ORAL | Status: DC

## 2015-04-23 MED ORDER — PRAVASTATIN SODIUM 20 MG PO TABS: 20.0000 mg | ORAL_TABLET | Freq: Every day | ORAL | Status: DC

## 2015-04-23 MED ORDER — TRAMADOL-ACETAMINOPHEN 37.5-325 MG PO TABS: 1.0000 | ORAL_TABLET | Freq: Three times a day (TID) | ORAL | Status: DC | PRN

## 2015-04-23 MED ORDER — LORAZEPAM 0.5 MG PO TABS: 0.5000 mg | ORAL_TABLET | Freq: Three times a day (TID) | ORAL | Status: DC | PRN

## 2015-04-23 MED ORDER — ATENOLOL 50 MG PO TABS: 50.0000 mg | ORAL_TABLET | Freq: Every day | ORAL | Status: DC

## 2015-04-23 MED ORDER — VARENICLINE TARTRATE 0.5 MG X 11 & 1 MG X 42 PO MISC: ORAL | Status: DC

## 2015-04-23 NOTE — Progress Notes (Signed)
Name: Felicia Little   MRN: [id number]    DOB: Feb 11, 1946   Date:04/23/2015       Progress Note  Subjective  Chief Complaint  Chief Complaint  Patient presents with  . Hypertension  . Hyperlipidemia  . Anxiety  . Muscle Pain    HPI  Hypertension   Patient presents for follow-up of hypertension. It has been present for over 5 years.  Patient states that there is compliance with medical regimen which consists of triamterene 75-50 one daily and atenolol 50 mg daily . There is no end organ disease. Cardiac risk factors include hypertension hyperlipidemia and diabetes.  Exercise regimen consist of none currently because of surgery .  Diet consist of some sodium restriction .  Hyperlipidemia  Patient has a history of hyperlipidemia for over 5 years.  Current medical regimen consist of pravastatin 20 mg daily at bedtime .  Compliance is good .  Diet and exercise are currently followed fairly well .  Risk factors for cardiovascular disease include hyperlipidemia and tobacco abuse hypertension .   There have been no side effects from the medication.    Anxiety history of present illness  Patient has a 5+ year history of chronic anxiety. She is currently on a regimen consisting of alprazolam 0.5 mg 3 times per day and is doing well with that.  Osteoarthritis and hip pain.  Patient's had right hip replacement recently has already had a right knee replacement. She was given Percocet postoperatively by her orthopedic surgeon. She is now out of that and feels that she needs any Ultracet to control her pain currently  Depression and anxiety   Patient remains on a regimen of doxepin and alprazolam for anxiety and depression symptoms. These work effectively for her. Past Medical History  Diagnosis Date  . Hypertension   . Depression   . Anxiety   . Pneumonia ~ 1995; 1996  . Arthritis     "all over" (04/06/2012)  . Chronic lower back pain   . Kidney stones   . [id number])     "maybe  once/week" (04/06/2012) - none recently    Social History  Substance Use Topics  . Smoking status: Former Smoker -- 0.50 packs/day for 33 years    Types: Cigarettes    Quit date: 05/02/2014  . Smokeless tobacco: Never Used  . Alcohol Use: No     Comment: 04/06/2012 "tried alcohol once; didn't like it; never tried it again"     Current outpatient prescriptions:  .  ALPRAZolam (NIRAVAM) 0.5 MG dissolvable tablet, Take 1 tablet (0.5 mg total) by mouth 3 (three) times daily., Disp: 90 tablet, Rfl: 2 .  apixaban (ELIQUIS) 2.5 MG TABS tablet, Take 1 tablet (2.5 mg total) by mouth every 12 (twelve) hours., Disp: 60 tablet, Rfl: 0 .  atenolol (TENORMIN) 50 MG tablet, Take 1 tablet (50 mg total) by mouth daily., Disp: 90 tablet, Rfl: 1 .  Dermatological Products, Misc. (NUVAIL) SOLN, Apply to affected toenail(s) daily (Patient not taking: Reported on 10/21/2014), Disp: 15 mL, Rfl: 12 .  docusate sodium (COLACE) 100 MG capsule, Take 1 capsule (100 mg total) by mouth 2 (two) times daily., Disp: 60 capsule, Rfl: 3 .  gabapentin (NEURONTIN) 300 MG capsule, Take 2 capsules (600 mg total) by mouth 2 (two) times daily. (Patient taking differently: Take 600 mg by mouth at bedtime. ), Disp: 180 capsule, Rfl: 1 .  HYDROcodone-acetaminophen (NORCO) 5-325 MG tablet, Take 1-2 tablets by mouth every 4 (four) hours as  needed for moderate pain., Disp: 90 tablet, Rfl: 0 .  nortriptyline (PAMELOR) 75 MG capsule, Take 1 capsule (75 mg total) by mouth at bedtime., Disp: 90 capsule, Rfl: 1 .  ondansetron (ZOFRAN) 4 MG tablet, Take 1 tablet (4 mg total) by mouth every 6 (six) hours as needed for nausea., Disp: 20 tablet, Rfl: 0 .  pravastatin (PRAVACHOL) 20 MG tablet, Take 1 tablet (20 mg total) by mouth at bedtime., Disp: 90 tablet, Rfl: 1 .  senna (SENOKOT) 8.6 MG TABS tablet, Take 2 tablets (17.2 mg total) by mouth at bedtime., Disp: 60 each, Rfl: 3 .  triamterene-hydrochlorothiazide (MAXZIDE) 75-50 MG per tablet, Take 0.5  tablets by mouth daily., Disp: 90 tablet, Rfl: 1  No Known Allergies  Review of Systems  Constitutional: Negative for fever, chills and weight loss.  HENT: Negative for congestion, hearing loss, sore throat and tinnitus.   Eyes: Negative for blurred vision, double vision and redness.  Respiratory: Negative for cough, hemoptysis and shortness of breath.   Cardiovascular: Negative for chest pain, palpitations, orthopnea, claudication and leg swelling.  Gastrointestinal: Negative for heartburn, nausea, vomiting, diarrhea, constipation and blood in stool.  Genitourinary: Negative for dysuria, urgency, frequency and hematuria.  Musculoskeletal: Positive for joint pain. Negative for myalgias, back pain, falls and neck pain.  Skin: Negative for itching.  Neurological: Negative for dizziness, tingling, tremors, focal weakness, seizures, loss of consciousness, weakness and headaches.  Endo/Heme/Allergies: Does not bruise/bleed easily.  Psychiatric/Behavioral: Positive for depression. Negative for substance abuse. The patient is nervous/anxious. The patient does not have insomnia.      Objective  Filed Vitals:   04/23/15 1121  BP: 128/82  Pulse: 71  Temp: 98.5 F (36.9 C)  TempSrc: Oral  Resp: 16  Height: 5\' 6"  (1.676 m)  Weight: 176 lb (79.833 kg)  SpO2: 98%     Physical Exam  Constitutional: She is oriented to person, place, and time and well-developed, well-nourished, and in no distress.  HENT:  Head: Normocephalic.  Eyes: EOM are normal. Pupils are equal, round, and reactive to light.  Neck: Normal range of motion. No thyromegaly present.  Cardiovascular: Normal rate, regular rhythm and normal heart sounds.   No murmur heard. Pulmonary/Chest: Effort normal and breath sounds normal.  Abdominal: Soft. Bowel sounds are normal.  Musculoskeletal: Normal range of motion. She exhibits no edema.  Some decrease in movement secondary to hip and knee pain she uses a cane  Neurological:  She is alert and oriented to person, place, and time. No cranial nerve deficit. Gait normal.  Skin: Skin is warm and dry. No rash noted.  Psychiatric: Memory and affect normal.  Anxious and loquacious      Assessment & Plan   1. Acute anxiety No change in meds - LORazepam (ATIVAN) 0.5 MG tablet; Take 1 tablet (0.5 mg total) by mouth every 8 (eight) hours as needed.  Dispense: 90 tablet; Refill: 2 - Comprehensive Metabolic Panel (CMET) - Lipid panel - TSH  2. Essential hypertension Well-controlled - Comprehensive Metabolic Panel (CMET) - Lipid panel - TSH  3. Instability of prosthetic hip, subsequent encounter Tramadol for pain - Comprehensive Metabolic Panel (CMET) - Lipid panel - TSH  4. Hyperlipidemia Labs - Lipid panel - TSH  5. Tobacco abuse Trial of , Chantix  - varenicline (CHANTIX STARTING MONTH PAK) 0.5 MG X 11 & 1 MG X 42 tablet; Take one 0.5 mg tablet by mouth once daily for 3 days, then increase to one 0.5 mg tablet  twice daily for 4 days, then increase to one 1 mg tablet twice daily.  Dispense: 53 tablet; Refill: 0 - varenicline (CHANTIX CONTINUING MONTH PAK) 1 MG tablet; Take 1 tablet (1 mg total) by mouth 2 (two) times daily.  Dispense: 60 tablet; Refill: 0  6. Chronic pain Tramadol with acetaminophen  7. Depression with anxiety Continue current regimen

## 2015-04-24 LAB — LIPID PANEL
CHOL/HDL RATIO: 4.3 ratio (ref 0.0–4.4)
Cholesterol, Total: 172 mg/dL (ref 100–199)
HDL: 40 mg/dL (ref >39)
LDL Calculated: 100 mg/dL — ABNORMAL HIGH (ref 0–99)
TRIGLYCERIDES: 161 mg/dL — AB (ref 0–149)
VLDL CHOLESTEROL CAL: 32 mg/dL (ref 5–40)

## 2015-04-24 LAB — COMPREHENSIVE METABOLIC PANEL
ALT: 5 IU/L (ref 0–32)
AST: 14 IU/L (ref 0–40)
Albumin/Globulin Ratio: 1.3 (ref 1.1–2.5)
Albumin: 4.4 g/dL (ref 3.5–4.8)
Alkaline Phosphatase: 88 IU/L (ref 39–117)
BUN/Creatinine Ratio: 11 (ref 11–26)
BUN: 12 mg/dL (ref 8–27)
Bilirubin Total: 0.3 mg/dL (ref 0.0–1.2)
CALCIUM: 9.5 mg/dL (ref 8.7–10.3)
CO2: 22 mmol/L (ref 18–29)
Chloride: 103 mmol/L (ref 96–106)
Creatinine, Ser: 1.13 mg/dL — ABNORMAL HIGH (ref 0.57–1.00)
GFR, EST AFRICAN AMERICAN: 57 mL/min/{1.73_m2} — AB (ref >59)
GFR, EST NON AFRICAN AMERICAN: 49 mL/min/{1.73_m2} — AB (ref >59)
GLUCOSE: 95 mg/dL (ref 65–99)
Globulin, Total: 3.4 g/dL (ref 1.5–4.5)
Potassium: 4 mmol/L (ref 3.5–5.2)
Sodium: 142 mmol/L (ref 134–144)
Total Protein: 7.8 g/dL (ref 6.0–8.5)

## 2015-04-24 LAB — TSH: TSH: 2.38 u[IU]/mL (ref 0.450–4.500)

## 2015-05-17 DIAGNOSIS — J209 Acute bronchitis, unspecified: Secondary | ICD-10-CM | POA: Diagnosis not present

## 2015-06-01 ENCOUNTER — Other Ambulatory Visit: Payer: Self-pay | Admitting: Family Medicine

## 2015-06-01 MED ORDER — ALPRAZOLAM 0.5 MG PO TABS: 0.5000 mg | ORAL_TABLET | Freq: Three times a day (TID) | ORAL | Status: DC | PRN

## 2015-06-01 NOTE — Telephone Encounter (Signed)
Patient called and stated that :orezepam do not control her anxiety like Alprazolam. Would like Alprazolam called in to pharmacy. Per Dr. Ancil Boozer a 1 month supply of Alprazolam can be called in. Spoke to [employer]. Patient is up to date with her refills. All other refills for Josephina Shih has been cancelled out.. Patient has been notified.

## 2015-06-03 ENCOUNTER — Other Ambulatory Visit: Payer: Self-pay | Admitting: Family Medicine

## 2015-06-16 DIAGNOSIS — Z96641 Presence of right artificial hip joint: Secondary | ICD-10-CM | POA: Diagnosis not present

## 2015-06-16 DIAGNOSIS — Z471 Aftercare following joint replacement surgery: Secondary | ICD-10-CM | POA: Diagnosis not present

## 2015-06-30 ENCOUNTER — Telehealth: Payer: Self-pay | Admitting: Family Medicine

## 2015-06-30 NOTE — Telephone Encounter (Signed)
Pt states she needs refill on her Alprazolam. Pt will be out on 07/01/2015.

## 2015-07-01 ENCOUNTER — Other Ambulatory Visit: Payer: Self-pay | Admitting: Family Medicine

## 2015-07-01 DIAGNOSIS — Z1231 Encounter for screening mammogram for malignant neoplasm of breast: Secondary | ICD-10-CM

## 2015-07-01 MED ORDER — ALPRAZOLAM 0.5 MG PO TABS: 0.5000 mg | ORAL_TABLET | Freq: Three times a day (TID) | ORAL | Status: DC | PRN

## 2015-07-01 NOTE — Telephone Encounter (Signed)
Pt informed

## 2015-07-01 NOTE — Telephone Encounter (Signed)
Called in refills to pharmacy on file for pt

## 2015-07-23 ENCOUNTER — Ambulatory Visit: Payer: Medicare Other | Admitting: Family Medicine

## 2015-08-13 ENCOUNTER — Ambulatory Visit
Admission: RE | Admit: 2015-08-13 | Discharge: 2015-08-13 | Disposition: A | Discharge status: Home / Self Care | Payer: Medicare Other | Source: Ambulatory Visit | Attending: Family Medicine | Admitting: Family Medicine

## 2015-08-13 ENCOUNTER — Other Ambulatory Visit: Payer: Self-pay | Admitting: Family Medicine

## 2015-08-13 DIAGNOSIS — Z1231 Encounter for screening mammogram for malignant neoplasm of breast: Secondary | ICD-10-CM | POA: Diagnosis not present

## 2015-09-03 ENCOUNTER — Encounter: Payer: Self-pay | Admitting: Family Medicine

## 2015-09-03 ENCOUNTER — Ambulatory Visit (INDEPENDENT_AMBULATORY_CARE_PROVIDER_SITE_OTHER): Payer: Medicare Other | Admitting: Family Medicine

## 2015-09-03 VITALS — BP 122/84 | HR 72 | Temp 98.6°F | Resp 18 | Ht 66.0 in | Wt 178.3 lb

## 2015-09-03 DIAGNOSIS — Z720 Tobacco use: Secondary | ICD-10-CM | POA: Diagnosis not present

## 2015-09-03 DIAGNOSIS — M150 Primary generalized (osteo)arthritis: Secondary | ICD-10-CM | POA: Diagnosis not present

## 2015-09-03 DIAGNOSIS — I10 Essential (primary) hypertension: Secondary | ICD-10-CM

## 2015-09-03 DIAGNOSIS — E785 Hyperlipidemia, unspecified: Secondary | ICD-10-CM | POA: Diagnosis not present

## 2015-09-03 DIAGNOSIS — G8929 Other chronic pain: Secondary | ICD-10-CM

## 2015-09-03 DIAGNOSIS — Z87891 Personal history of nicotine dependence: Secondary | ICD-10-CM

## 2015-09-03 DIAGNOSIS — Z5181 Encounter for therapeutic drug level monitoring: Secondary | ICD-10-CM | POA: Diagnosis not present

## 2015-09-03 DIAGNOSIS — N289 Disorder of kidney and ureter, unspecified: Secondary | ICD-10-CM

## 2015-09-03 DIAGNOSIS — Z79899 Other long term (current) drug therapy: Secondary | ICD-10-CM

## 2015-09-03 DIAGNOSIS — M159 Polyosteoarthritis, unspecified: Secondary | ICD-10-CM

## 2015-09-03 DIAGNOSIS — F419 Anxiety disorder, unspecified: Secondary | ICD-10-CM | POA: Diagnosis not present

## 2015-09-03 HISTORY — DX: Other long term (current) drug therapy: Z79.899

## 2015-09-03 HISTORY — DX: Disorder of kidney and ureter, unspecified: N28.9

## 2015-09-03 MED ORDER — TRAMADOL-ACETAMINOPHEN 37.5-325 MG PO TABS: 1.0000 | ORAL_TABLET | Freq: Three times a day (TID) | ORAL | Status: DC | PRN

## 2015-09-03 MED ORDER — ALPRAZOLAM 0.5 MG PO TABS: 0.5000 mg | ORAL_TABLET | Freq: Three times a day (TID) | ORAL | Status: DC | PRN

## 2015-09-03 MED ORDER — GABAPENTIN 300 MG PO CAPS: 600.0000 mg | ORAL_CAPSULE | Freq: Every day | ORAL | Status: DC

## 2015-09-03 MED ORDER — TRIAMTERENE-HCTZ 75-50 MG PO TABS: 0.5000 | ORAL_TABLET | Freq: Every day | ORAL | Status: DC

## 2015-09-03 MED ORDER — PRAVASTATIN SODIUM 20 MG PO TABS: 20.0000 mg | ORAL_TABLET | Freq: Every day | ORAL | Status: DC

## 2015-09-03 MED ORDER — ATENOLOL 50 MG PO TABS: 50.0000 mg | ORAL_TABLET | Freq: Every day | ORAL | Status: DC

## 2015-09-03 MED ORDER — NORTRIPTYLINE HCL 75 MG PO CAPS: 75.0000 mg | ORAL_CAPSULE | Freq: Every day | ORAL | Status: DC

## 2015-09-03 NOTE — Progress Notes (Signed)
BP 122/84 mmHg  Pulse 72  Temp(Src) 98.6 F (37 C)  Resp 18  Ht 5\' 6"  (1.676 m)  Wt 178 lb 5 oz (80.882 kg)  BMI 28.79 kg/m2  SpO2 97%   Subjective:    Patient ID: Felicia Little, female    DOB: May 21, 1945, 70 y.o.   MRN: [id number]  HPI: Felicia Little is a 70 y.o. female  Chief Complaint  Patient presents with  . Medication Refill   Patient is new to me; her usual provider is out of the office and patient is in need of refills of her usual medicines  HTN; well-controlled today on current medicines; atenolol and triamterene-HCTZ  High cholesterol really helped by pravastatin; does not have any muscles aches with this medicine; last three lipid panels reviewed; LDL dropped from 144 to 100; did not tolerated Lipitor  Lab Results  Component Value Date   CHOL 168 09/03/2015   CHOL 172 04/23/2015   CHOL 218* 10/23/2014   Lab Results  Component Value Date   HDL 46 09/03/2015   HDL 40 04/23/2015   HDL 39* 10/23/2014   Lab Results  Component Value Date   LDLCALC 98 09/03/2015   LDLCALC 100* 04/23/2015   LDLCALC 144* 10/23/2014   Lab Results  Component Value Date   TRIG 122 09/03/2015   TRIG 161* 04/23/2015   TRIG 175* 10/23/2014   Lab Results  Component Value Date   CHOLHDL 4.3 04/23/2015   CHOLHDL 5.6* 10/23/2014   No results found for: LDLDIRECT  She has chronic back and joint problems, and takes gabapentin, tramadol/apap, and nortriptyline; had back surgery 2 years ago; just had right hip done over again in Sept 2016; the right hip is better now, but the right knee is in real bad shape; she is wearing a brace Rxd by the orthopaedist; brace helps her to walk; she does not feel loopy or drunk or over-medicated by her medicines Pain is worsened by damp weather and rain Tramadol; takes 3 pills a day most days; sometimes just half a pill if pain not too bad On the TCA, last EKG did not show BBB (reviewed by MD today)  She takes alprazolam for anxiety; she tried  another pill but couldn't hardly function; they tried lorazepam instead and couldn't do anything; she has been on alprazolam off and on for a long time, for years; does not make her loopy or goofy or drunk; no alcohol; she has had anxiety for a long time; has four daughters, two are twins, divorced since 1976; cites her anxiety going back 40+ years  Last TSH normal Lab Results  Component Value Date   TSH 2.380 04/23/2015    Gets 90 day supplies of her medicines usually She brought her bottles with her today  Depression screen [facility] 2/9 09/03/2015 04/23/2015 01/21/2015  Decreased Interest 0 0 0  Down, Depressed, Hopeless 0 0 0  PHQ - 2 Score 0 0 0   Relevant past medical, surgical, family and social history reviewed Past Medical History  Diagnosis Date  . Hypertension   . Depression   . Anxiety   . Pneumonia ~ 1995; 1996  . Arthritis     "all over" (04/06/2012)  . Chronic lower back pain   . Kidney stones   . [id number]     "maybe once/week" (04/06/2012) - none recently  . Renal insufficiency 09/03/2015  . Hx of smoking 10/22/2014  . Chronic prescription benzodiazepine use 09/03/2015   Past Surgical  History  Procedure Laterality Date  . Joint replacement      BIL  HIP  REPLACEMENTS   . Carpal tunnel release  1990's    "right" (04/06/2012)  . Knee arthroscopy  1990's    "right" (04/06/2012)  . Posterior lumbar fusion  04/06/2012  . Cholecystectomy  1990's  . Total hip arthroplasty  2005; 2007    "right; left" (04/06/2012)  . Vaginal hysterectomy  1979  . 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 Anterior hip revision Right 01/26/2015    Procedure: REVISION RIGHT TOTAL HIP ARTHROPLASTY ACETABULAR COMPONENTS  ANTERIOR APPROACH;  Surgeon: Rod Can, MD;   Location: [place];  Service: Orthopedics;  Laterality: Right;   Family History  Problem Relation Age of Onset  . Hypertension Daughter   . Heart attack Mother   . Alzheimer's disease Mother    Social History  Substance Use Topics  . Smoking status: Former Smoker -- 0.50 packs/day for 33 years    Types: Cigarettes    Quit date: 05/02/2014  . Smokeless tobacco: Never Used  . Alcohol Use: No     Comment: 04/06/2012 "tried alcohol once; didn't like it; never tried it again"   Interim medical history since last visit reviewed. Allergies and medications reviewed  Review of Systems Per HPI unless specifically indicated above     Objective:    BP 122/84 mmHg  Pulse 72  Temp(Src) 98.6 F (37 C)  Resp 18  Ht 5\' 6"  (1.676 m)  Wt 178 lb 5 oz (80.882 kg)  BMI 28.79 kg/m2  SpO2 97%  Wt Readings from Last 3 Encounters:  09/03/15 178 lb 5 oz (80.882 kg)  04/23/15 176 lb (79.833 kg)  01/26/15 180 lb 8.9 oz (81.9 kg)    Physical Exam  Constitutional: She appears well-developed and well-nourished. No distress.  HENT:  Head: Normocephalic and atraumatic.  Eyes: EOM are normal. No scleral icterus.  Neck: No thyromegaly present.  Cardiovascular: Normal rate, regular rhythm and normal heart sounds.   No murmur heard. Pulmonary/Chest: Effort normal and breath sounds normal. No respiratory distress. She has no wheezes.  Abdominal: Soft. Bowel sounds are normal. She exhibits no distension.  Musculoskeletal: She exhibits no edema.       Right knee: She exhibits decreased range of motion. She exhibits no swelling and no effusion.  Wearing hinged brace  Neurological: She is alert. She exhibits normal muscle tone.  Skin: Skin is warm and dry. She is not diaphoretic. No pallor.  Psychiatric: She has a normal mood and affect. Her behavior is normal. Judgment and thought content normal. Cognition and memory are not impaired.  Good eye contact with examiner, pleasant and cooperative, good historian       Assessment & Plan:   Problem List Items Addressed This Visit      Cardiovascular and Mediastinum   Essential hypertension - Primary    Well-controlled today; continue meds; try the DASH guidelines      Relevant Medications   atenolol (TENORMIN) 50 MG tablet   triamterene-hydrochlorothiazide (MAXZIDE) 75-50 MG tablet   pravastatin (PRAVACHOL) 20 MG tablet   aspirin EC 81 MG tablet     Musculoskeletal and Integument   Primary osteoarthritis involving multiple joints    Consider turmeric in addition to current  meds      Relevant Medications   traMADol-acetaminophen (ULTRACET) 37.5-325 MG tablet   aspirin EC 81 MG tablet     Genitourinary   Renal insufficiency    Recheck kidneys; avoid NSAIDs        Other   Chronic pain    Back and knee; continue meds as prescribed by previous doctor; controlled substance speech given, contract signed      Relevant Medications   nortriptyline (PAMELOR) 75 MG capsule   gabapentin (NEURONTIN) 300 MG capsule   traMADol-acetaminophen (ULTRACET) 37.5-325 MG tablet   aspirin EC 81 MG tablet   Chronic prescription benzodiazepine use    Patient has been on benzos for apparently decades; she usually sees another provider here in the practice, so I will not spend time today trying to convince patient to wean off of that; if she becomes my patient permanently, we'll wean her off; temporary supply of benzo given; usual precautions given, typical speech given, controlled substance contract provided      Relevant Orders   ToxASSURE Select 13 (MW), Urine   Controlled substance agreement signed    Discussed risk of controlled substances including possible unintentional overdose, especially if mixed with alcohol or other pills; typical speech given including illegal to share, even out of the goodness of patient's heart, always keep in the original bottle, safeguard medicine, do NOT mix with alcohol, other pain pills, "nerve" or anxiety pills, or  sleeping pills; I am not obligated to approve of early refill or give new prescription if medicine is lost, stolen, or destroyed even with a police report, etc.; patient agrees with plan; controlled substance contract signed; copy of contract given to patient      Hx of smoking    Praise given, so glad she quit smoking      Hyperlipidemia    Check lipids; continue statin      Relevant Medications   atenolol (TENORMIN) 50 MG tablet   triamterene-hydrochlorothiazide (MAXZIDE) 75-50 MG tablet   pravastatin (PRAVACHOL) 20 MG tablet   aspirin EC 81 MG tablet   Other Relevant Orders   Lipid Panel w/o Chol/HDL Ratio (Completed)   Medication monitoring encounter   Relevant Orders   Comprehensive metabolic panel (Completed)       Follow up plan: Return in about 3 months (around 12/04/2015) for medication management.  An after-visit summary was printed and given to the patient at [place].  Please see the patient instructions which may contain other information and recommendations beyond what is mentioned above in the assessment and plan.  Meds ordered this encounter  Medications  . atenolol (TENORMIN) 50 MG tablet    Sig: Take 1 tablet (50 mg total) by mouth daily.    Dispense:  90 tablet    Refill:  1  . triamterene-hydrochlorothiazide (MAXZIDE) 75-50 MG tablet    Sig: Take 0.5 tablets by mouth daily.    Dispense:  90 tablet    Refill:  1  . nortriptyline (PAMELOR) 75 MG capsule    Sig: Take 1 capsule (75 mg total) by mouth at bedtime.    Dispense:  90 capsule    Refill:  1  . pravastatin (PRAVACHOL) 20 MG tablet    Sig: Take 1 tablet (20 mg total) by mouth at bedtime.    Dispense:  90 tablet    Refill:  0  . gabapentin (NEURONTIN) 300 MG capsule    Sig: Take 2 capsules (600 mg total) by mouth at bedtime.  Dispense:  180 capsule    Refill:  1  . traMADol-acetaminophen (ULTRACET) 37.5-325 MG tablet    Sig: Take 1 tablet by mouth every 8 (eight) hours as needed.     Dispense:  90 tablet    Refill:  0    Refill 05/24/2015  . DISCONTD: ALPRAZolam (XANAX) 0.5 MG tablet    Sig: Take 1 tablet (0.5 mg total) by mouth 3 (three) times daily as needed for anxiety.    Dispense:  90 tablet    Refill:  1  . aspirin EC 81 MG tablet    Sig: Take 1 tablet (81 mg total) by mouth daily.  Marland Kitchen ALPRAZolam (XANAX) 0.5 MG tablet    Sig: Take 1 tablet (0.5 mg total) by mouth 3 (three) times daily as needed for anxiety.    Dispense:  90 tablet    Refill:  1   Orders Placed This Encounter  Procedures  . ToxASSURE Select 13 (MW), Urine  . Comprehensive metabolic panel  . Lipid Panel w/o Chol/HDL Ratio

## 2015-09-03 NOTE — Assessment & Plan Note (Signed)
Well-controlled today; continue meds; try the DASH guidelines 

## 2015-09-03 NOTE — Assessment & Plan Note (Signed)
Back and knee; continue meds as prescribed by previous doctor; controlled substance speech given, contract signed

## 2015-09-03 NOTE — Patient Instructions (Signed)
Try turmeric as a natural anti-inflammatory (for pain and arthritis). It comes in capsules where you buy aspirin and fish oil, but also as a spice where you buy pepper and garlic powder.  Do use the least amount of the anxiety medicine that you can; keep trying to take less and less each month  Your goal blood pressure is less than 150 mmHg on top. Try to follow the DASH guidelines (DASH stands for Dietary Approaches to Stop Hypertension) Try to limit the sodium in your diet.  Ideally, consume less than 1.5 grams (less than 1,500mg ) per day. Do not add salt when cooking or at the table.  Check the sodium amount on labels when shopping, and choose items lower in sodium when given a choice. Avoid or limit foods that already contain a lot of sodium. Eat a diet rich in fruits and vegetables and whole grains.  Try to limit saturated fats in your diet (bologna, hot dogs, barbeque, cheeseburgers, hamburgers, steak, bacon, sausage, cheese, etc.) and get more fresh fruits, vegetables, and whole grains  Have labs done today  Return in 3 months

## 2015-09-03 NOTE — Assessment & Plan Note (Signed)
Praise given, so glad she quit smoking

## 2015-09-03 NOTE — Assessment & Plan Note (Signed)
Check lipids; continue statin 

## 2015-09-03 NOTE — Assessment & Plan Note (Signed)

## 2015-09-03 NOTE — Assessment & Plan Note (Signed)
Consider turmeric in addition to current meds

## 2015-09-03 NOTE — Assessment & Plan Note (Signed)
Recheck kidneys; avoid NSAIDs

## 2015-09-04 LAB — COMPREHENSIVE METABOLIC PANEL
A/G RATIO: 1.3 (ref 1.2–2.2)
ALBUMIN: 4.2 g/dL (ref 3.5–4.8)
ALT: 8 IU/L (ref 0–32)
AST: 13 IU/L (ref 0–40)
Alkaline Phosphatase: 83 IU/L (ref 39–117)
BUN / CREAT RATIO: 12 (ref 12–28)
BUN: 11 mg/dL (ref 8–27)
Bilirubin Total: 0.5 mg/dL (ref 0.0–1.2)
CALCIUM: 9.6 mg/dL (ref 8.7–10.3)
CO2: 25 mmol/L (ref 18–29)
CREATININE: 0.95 mg/dL (ref 0.57–1.00)
Chloride: 101 mmol/L (ref 96–106)
GFR, EST AFRICAN AMERICAN: 70 mL/min/{1.73_m2} (ref >59)
GFR, EST NON AFRICAN AMERICAN: 60 mL/min/{1.73_m2} (ref >59)
GLOBULIN, TOTAL: 3.2 g/dL (ref 1.5–4.5)
Glucose: 86 mg/dL (ref 65–99)
Potassium: 4.1 mmol/L (ref 3.5–5.2)
SODIUM: 140 mmol/L (ref 134–144)
TOTAL PROTEIN: 7.4 g/dL (ref 6.0–8.5)

## 2015-09-04 LAB — LIPID PANEL W/O CHOL/HDL RATIO
Cholesterol, Total: 168 mg/dL (ref 100–199)
HDL: 46 mg/dL (ref >39)
LDL CALC: 98 mg/dL (ref 0–99)
TRIGLYCERIDES: 122 mg/dL (ref 0–149)
VLDL Cholesterol Cal: 24 mg/dL (ref 5–40)

## 2015-09-06 NOTE — Assessment & Plan Note (Signed)
Patient has been on benzos for apparently decades; she usually sees another provider here in the practice, so I will not spend time today trying to convince patient to wean off of that; if she becomes my patient permanently, we'll wean her off; temporary supply of benzo given; usual precautions given, typical speech given, controlled substance contract provided

## 2015-10-15 DIAGNOSIS — M542 Cervicalgia: Secondary | ICD-10-CM | POA: Diagnosis not present

## 2015-10-15 DIAGNOSIS — W19XXXA Unspecified fall, initial encounter: Secondary | ICD-10-CM | POA: Diagnosis not present

## 2015-10-28 ENCOUNTER — Telehealth: Payer: Self-pay | Admitting: Family Medicine

## 2015-10-28 NOTE — Telephone Encounter (Signed)
Pt scheduled  

## 2015-10-28 NOTE — Telephone Encounter (Signed)
I'm denying her xanax request; she should not be out Getting pain medicine from another clinic without letting us know right away violates her pain contract, so I'll need to see her here in the office right away It looks like there is an 8;20 am appt Thursday

## 2015-10-29 ENCOUNTER — Encounter: Payer: Self-pay | Admitting: Family Medicine

## 2015-10-29 ENCOUNTER — Ambulatory Visit (INDEPENDENT_AMBULATORY_CARE_PROVIDER_SITE_OTHER): Payer: Medicare Other | Admitting: Family Medicine

## 2015-10-29 DIAGNOSIS — M150 Primary generalized (osteo)arthritis: Secondary | ICD-10-CM

## 2015-10-29 DIAGNOSIS — I10 Essential (primary) hypertension: Secondary | ICD-10-CM

## 2015-10-29 DIAGNOSIS — Z79899 Other long term (current) drug therapy: Secondary | ICD-10-CM | POA: Diagnosis not present

## 2015-10-29 DIAGNOSIS — M159 Polyosteoarthritis, unspecified: Secondary | ICD-10-CM

## 2015-10-29 MED ORDER — TRIAMTERENE-HCTZ 75-50 MG PO TABS: 0.5000 | ORAL_TABLET | Freq: Every day | ORAL | 1 refills | Status: DC

## 2015-10-29 MED ORDER — ATENOLOL 50 MG PO TABS: 50.0000 mg | ORAL_TABLET | Freq: Every day | ORAL | 1 refills | Status: DC

## 2015-10-29 MED ORDER — ALPRAZOLAM 0.5 MG PO TABS: ORAL_TABLET | ORAL | 0 refills | Status: DC

## 2015-10-29 NOTE — Patient Instructions (Addendum)
We'll have you see the pain clinic about your knees Decrease the Xanax (alprazolam) to just twice a day now if needed You'll take it twice a day for one month, then we'll decrease it further to once a day for one month, then stop Start back on the blood pressure medicine Your goal blood pressure is less than 150 mmHg on top. Try to follow the DASH guidelines (DASH stands for Dietary Approaches to Stop Hypertension) Try to limit the sodium in your diet.  Ideally, consume less than 1.5 grams (less than 1,500mg ) per day. Do not add salt when cooking or at the table.  Check the sodium amount on labels when shopping, and choose items lower in sodium when given a choice. Avoid or limit foods that already contain a lot of sodium. Eat a diet rich in fruits and vegetables and whole grains. Call if blood pressure not to goal back on your medicine Consider seeing a counselor for anxiety  12 Ways to Curb Anxiety  ?Anxiety is normal human sensation. It is what helped our ancestors survive the pitfalls of the wilderness. Anxiety is defined as experiencing worry or nervousness about an imminent event or something with an uncertain outcome. It is a feeling experienced by most people at some point in their lives. Anxiety can be triggered by a very personal issue, such as the illness of a loved one, or an event of global proportions, such as a refugee crisis. Some of the symptoms of anxiety are:  Feeling restless.  Having a feeling of impending danger.  Increased heart rate.  Rapid breathing. Sweating.  Shaking.  Weakness or feeling tired.  Difficulty concentrating on anything except the current worry.  Insomnia.  Stomach or bowel problems. What can we do about anxiety we may be feeling? There are many techniques to help manage stress and relax. Here are 12 ways you can reduce your anxiety almost immediately: 1. Turn off the constant feed of information. Take a social media sabbatical. Studies have shown that  social media directly contributes to social anxiety.  2. Monitor your television viewing habits. Are you watching shows that are also contributing to your anxiety, such as 24-hour news stations? Try watching something else, or better yet, nothing at all. Instead, listen to music, read an inspirational book or practice a hobby. 3. Eat nutritious meals. Also, don't skip meals and keep healthful snacks on hand. Hunger and poor diet contributes to feeling anxious. 4. Sleep. Sleeping on a regular schedule for at least seven to eight hours a night will do wonders for your outlook when you are awake. 5. Exercise. Regular exercise will help rid your body of that anxious energy and help you get more restful sleep. 6. Try deep (diaphragmatic) breathing. Inhale slowly through your nose for five seconds and exhale through your mouth. 7. Practice acceptance and gratitude. When anxiety hits, accept that there are things out of your control that shouldn't be of immediate concern.  8. Seek out humor. When anxiety strikes, watch a funny video, read jokes or call a friend who makes you laugh. Laughter is healing for our bodies and releases endorphins that are calming. 9. Stay positive. Take the effort to replace negative thoughts with positive ones. Try to see a stressful situation in a positive light. Try to come up with solutions rather than dwelling on the problem. 10. Figure out what triggers your anxiety. Keep a journal and make note of anxious moments and the events surrounding them. This will help  you identify triggers you can avoid or even eliminate. 11. Talk to someone. Let a trusted friend, family member or even trained professional know that you are feeling overwhelmed and anxious. Verbalize what you are feeling and why.  12. Volunteer. If your anxiety is triggered by a crisis on a large scale, become an advocate and work to resolve the problem that is causing you unease. Anxiety is often unwelcome and can  become overwhelming. If not kept in check, it can become a disorder that could require medical treatment. However, if you take the time to care for yourself and avoid the triggers that make you anxious, you will be able to find moments of relaxation and clarity that make your life much more enjoyable.

## 2015-10-29 NOTE — Assessment & Plan Note (Signed)
Refer to pain clinic; offered referral to orthopaedist, but patient declined; refer to pain management instead; I am unable to prescribe her any more narcotics for pain control since she violated her pain contract

## 2015-10-29 NOTE — Assessment & Plan Note (Signed)
She violated her pain contract so I explained we'll wean her off of the Xanax; decrease from three times a day to twice a day for one month, then once a day for one month, then stop

## 2015-10-29 NOTE — Progress Notes (Signed)
BP (!) 152/88   Pulse 71   Temp 99 F (37.2 C) (Oral)   Resp 14   Wt 179 lb (81.2 kg)   SpO2 96%   BMI 28.89 kg/m    Subjective:    Patient ID: Felicia Little, female    DOB: 1945-10-09, 70 y.o.   MRN: [id number]  HPI: Felicia Little is a 70 y.o. female  Chief Complaint  Patient presents with  . Medication Refill   She is on Xanax, prn for anxiety; has been on that for years; she was on it three times a day; started with just two times a day; in the evening, she gets tired  She needs surgery on her right knee, arthritis and wear and tear; she has had back surgery, three hip replacements; two of them in 2005, then another last year; she wears a hinged brace on the right knee; she got 20 pills of ultracet on July 13th from another provider per [employer] which violates our controlled substance contract  High cholesterol; she tries to limit saturated fats; on statin, and tolerating pravastatin just fine; did not tolerated atorvastatin because of muscle aches Lab Results  Component Value Date   CHOL 168 09/03/2015   HDL 46 09/03/2015   [place] 98 09/03/2015   TRIG 122 09/03/2015   CHOLHDL 4.3 04/23/2015   High blood pressure; checks at pharmacy and it runs in the 140s and 150s, 88 range at the bottom but she has been out of her blood pressure medicine for a month and a half  Depression screen [facility] 2/9 10/29/2015 09/03/2015 04/23/2015 01/21/2015  Decreased Interest 0 0 0 0  Down, Depressed, Hopeless 0 0 0 0  PHQ - 2 Score 0 0 0 0   Relevant past medical, surgical, family and social history reviewed Past Medical History:  Diagnosis Date  . Anxiety   . Arthritis    "all over" (04/06/2012)  . Chronic lower back pain   . Chronic prescription benzodiazepine use 09/03/2015  . Depression   . [id number]    "maybe once/week" (04/06/2012) - none recently  . Hx of smoking 10/22/2014  . Hypertension   . Kidney stones   . Pneumonia ~ 1995; 1996  . Renal insufficiency 09/03/2015    Past Surgical History:  Procedure Laterality Date  . ANTERIOR HIP REVISION Right 01/26/2015   Procedure: REVISION RIGHT TOTAL HIP ARTHROPLASTY ACETABULAR COMPONENTS  ANTERIOR APPROACH;  Surgeon: Rod Can, MD;  Location: [place];  Service: Orthopedics;  Laterality: Right;  . CARPAL TUNNEL RELEASE  1990's   "right" (04/06/2012)  . CHOLECYSTECTOMY  1990's  . JOINT REPLACEMENT     BIL  HIP  REPLACEMENTS   . KNEE ARTHROSCOPY  1990's   "right" (04/06/2012)  . LUMBAR WOUND DEBRIDEMENT  04/11/2012   Procedure: LUMBAR WOUND DEBRIDEMENT;  Surgeon: Eustace Moore, MD;  Location: [place] [facility];  Service: Neurosurgery;  Laterality: N/A;  Irrigation and debridement of lumbar wound, Repair of pseudomeningocele not requiring laminectomy  . POSTERIOR LUMBAR FUSION  04/06/2012  . TOTAL HIP ARTHROPLASTY  2005; 2007   "right; left" (04/06/2012)  . VAGINAL HYSTERECTOMY  1979   Family History  Problem Relation Age of Onset  . Hypertension Daughter   . Heart attack Mother   . Alzheimer's disease Mother    Social History  Substance Use Topics  . Smoking status: Former Smoker    Packs/day: 0.50    Years: 33.00    Types: Cigarettes  Quit date: 05/02/2014  . Smokeless tobacco: Never Used  . Alcohol use No     Comment: 04/06/2012 "tried alcohol once; didn't like it; never tried it again"   Interim medical history since last visit reviewed. Allergies and medications reviewed  Review of Systems Per HPI unless specifically indicated above     Objective:    BP (!) 152/88   Pulse 71   Temp 99 F (37.2 C) (Oral)   Resp 14   Wt 179 lb (81.2 kg)   SpO2 96%   BMI 28.89 kg/m   Wt Readings from Last 3 Encounters:  10/29/15 179 lb (81.2 kg)  09/03/15 178 lb 5 oz (80.9 kg)  04/23/15 176 lb (79.8 kg)    Physical Exam  Constitutional: She appears well-developed and well-nourished. No distress.  HENT:  Head: Normocephalic and atraumatic.  Eyes: EOM are normal. No scleral icterus.  Cardiovascular:  Normal rate, regular rhythm and normal heart sounds.   No murmur heard. Pulmonary/Chest: Effort normal and breath sounds normal.  Musculoskeletal: She exhibits no edema.       Right knee: She exhibits swelling, effusion and deformity.       Left knee: She exhibits decreased range of motion, swelling and deformity.  Neurological: She is alert.  Skin: Skin is warm and dry. She is not diaphoretic. No pallor.  Psychiatric: She does not exhibit a depressed mood.  A little upset with examiner about discussing the terms of the controlled substance contract had been violated she would not be getting tramadol any more and her benzo would be tapered   Results for orders placed or performed in visit on 09/03/15  Comprehensive metabolic panel  Result Value Ref Range   Glucose 86 65 - 99 mg/dL   BUN 11 8 - 27 mg/dL   Creatinine, Ser 0.95 0.57 - 1.00 mg/dL   GFR calc non Af Amer 60 >59 mL/min/1.73   GFR calc Af Amer 70 >59 mL/min/1.73   BUN/Creatinine Ratio 12 12 - 28   Sodium 140 134 - 144 mmol/L   Potassium 4.1 3.5 - 5.2 mmol/L   Chloride 101 96 - 106 mmol/L   CO2 25 18 - 29 mmol/L   Calcium 9.6 8.7 - 10.3 mg/dL   Total Protein 7.4 6.0 - 8.5 g/dL   Albumin 4.2 3.5 - 4.8 g/dL   Globulin, Total 3.2 1.5 - 4.5 g/dL   Albumin/Globulin Ratio 1.3 1.2 - 2.2   Bilirubin Total 0.5 0.0 - 1.2 mg/dL   Alkaline Phosphatase 83 39 - 117 IU/L   AST 13 0 - 40 IU/L   ALT 8 0 - 32 IU/L  Lipid Panel w/o Chol/HDL Ratio  Result Value Ref Range   Cholesterol, Total 168 100 - 199 mg/dL   Triglycerides 122 0 - 149 mg/dL   HDL 46 >39 mg/dL   VLDL Cholesterol Cal 24 5 - 40 mg/dL   LDL Calculated 98 0 - 99 mg/dL      Assessment & Plan:   Problem List Items Addressed This Visit      Cardiovascular and Mediastinum   Essential hypertension    Start back on medicines; she has not been taking her BP meds for a while, so I encouraged her to start back; monitor BP and call if systolic is not less than 150       Relevant Medications   triamterene-hydrochlorothiazide (MAXZIDE) 75-50 MG tablet   atenolol (TENORMIN) 50 MG tablet     Musculoskeletal and Integument  Primary osteoarthritis involving multiple joints    Refer to pain clinic; offered referral to orthopaedist, but patient declined; refer to pain management instead; I am unable to prescribe her any more narcotics for pain control since she violated her pain contract      Relevant Orders   Ambulatory referral to Pain Clinic     Other   Chronic prescription benzodiazepine use    She violated her pain contract so I explained we'll wean her off of the Xanax; decrease from three times a day to twice a day for one month, then once a day for one month, then stop       Other Visit Diagnoses   None.     Follow up plan: Return in about 4 months (around 03/07/2016) for fasting labs and visit.  An after-visit summary was printed and given to the patient at [place].  Please see the patient instructions which may contain other information and recommendations beyond what is mentioned above in the assessment and plan.  Meds ordered this encounter  Medications  . ALPRAZolam (XANAX) 0.5 MG tablet    Sig: One by mouth TWICE a day PRN -- we're weaning this down    Dispense:  60 tablet    Refill:  0  . triamterene-hydrochlorothiazide (MAXZIDE) 75-50 MG tablet    Sig: Take 0.5 tablets by mouth daily.    Dispense:  90 tablet    Refill:  1  . atenolol (TENORMIN) 50 MG tablet    Sig: Take 1 tablet (50 mg total) by mouth daily.    Dispense:  90 tablet    Refill:  1    Orders Placed This Encounter  Procedures  . Ambulatory referral to Pain Clinic   Face-to-face time with patient was more than 25 minutes, >50% time spent counseling and coordination of care

## 2015-10-29 NOTE — Assessment & Plan Note (Signed)
Start back on medicines; she has not been taking her BP meds for a while, so I encouraged her to start back; monitor BP and call if systolic is not less than 150

## 2015-11-22 ENCOUNTER — Other Ambulatory Visit: Payer: Self-pay | Admitting: Family Medicine

## 2015-11-22 NOTE — Telephone Encounter (Signed)
Sgpt and lipids from 09/03/15 reviewed Rx approved

## 2015-12-01 DIAGNOSIS — M25561 Pain in right knee: Secondary | ICD-10-CM | POA: Diagnosis not present

## 2015-12-01 DIAGNOSIS — M1711 Unilateral primary osteoarthritis, right knee: Secondary | ICD-10-CM | POA: Diagnosis not present

## 2015-12-08 ENCOUNTER — Ambulatory Visit: Payer: Medicare Other | Admitting: Family Medicine

## 2015-12-21 ENCOUNTER — Ambulatory Visit: Payer: Medicare Other | Admitting: Primary Care

## 2015-12-30 ENCOUNTER — Ambulatory Visit (INDEPENDENT_AMBULATORY_CARE_PROVIDER_SITE_OTHER): Payer: Medicare Other | Admitting: Primary Care

## 2015-12-30 ENCOUNTER — Encounter: Payer: Self-pay | Admitting: Radiology

## 2015-12-30 ENCOUNTER — Encounter: Payer: Self-pay | Admitting: Primary Care

## 2015-12-30 VITALS — BP 176/100 | HR 110 | Temp 98.1°F | Ht 66.0 in | Wt 173.0 lb

## 2015-12-30 DIAGNOSIS — M150 Primary generalized (osteo)arthritis: Secondary | ICD-10-CM

## 2015-12-30 DIAGNOSIS — M1990 Unspecified osteoarthritis, unspecified site: Secondary | ICD-10-CM | POA: Diagnosis not present

## 2015-12-30 DIAGNOSIS — F32A Depression, unspecified: Secondary | ICD-10-CM

## 2015-12-30 DIAGNOSIS — F419 Anxiety disorder, unspecified: Secondary | ICD-10-CM

## 2015-12-30 DIAGNOSIS — E785 Hyperlipidemia, unspecified: Secondary | ICD-10-CM

## 2015-12-30 DIAGNOSIS — F418 Other specified anxiety disorders: Secondary | ICD-10-CM

## 2015-12-30 DIAGNOSIS — Z79891 Long term (current) use of opiate analgesic: Secondary | ICD-10-CM | POA: Diagnosis not present

## 2015-12-30 DIAGNOSIS — Z79899 Other long term (current) drug therapy: Secondary | ICD-10-CM

## 2015-12-30 DIAGNOSIS — M159 Polyosteoarthritis, unspecified: Secondary | ICD-10-CM

## 2015-12-30 DIAGNOSIS — I10 Essential (primary) hypertension: Secondary | ICD-10-CM | POA: Diagnosis not present

## 2015-12-30 DIAGNOSIS — F329 Major depressive disorder, single episode, unspecified: Secondary | ICD-10-CM

## 2015-12-30 DIAGNOSIS — N289 Disorder of kidney and ureter, unspecified: Secondary | ICD-10-CM

## 2015-12-30 MED ORDER — PRAVASTATIN SODIUM 20 MG PO TABS: 20.0000 mg | ORAL_TABLET | Freq: Every day | ORAL | 1 refills | Status: DC

## 2015-12-30 MED ORDER — PRAVASTATIN SODIUM 20 MG PO TABS: 20.0000 mg | ORAL_TABLET | Freq: Every day | ORAL | 3 refills | Status: DC

## 2015-12-30 MED ORDER — TRAMADOL-ACETAMINOPHEN 37.5-325 MG PO TABS: ORAL_TABLET | ORAL | 0 refills | Status: DC

## 2015-12-30 MED ORDER — ATENOLOL 50 MG PO TABS: 50.0000 mg | ORAL_TABLET | Freq: Every day | ORAL | 1 refills | Status: DC

## 2015-12-30 MED ORDER — ATENOLOL 50 MG PO TABS: 50.0000 mg | ORAL_TABLET | Freq: Every day | ORAL | 3 refills | Status: DC

## 2015-12-30 MED ORDER — HYDROXYZINE HCL 25 MG PO TABS: ORAL_TABLET | ORAL | 0 refills | Status: DC

## 2015-12-30 NOTE — Assessment & Plan Note (Signed)
Patient following with orthopedist who would like to complete surgery to right knee. Recommend this given her chronic pain. Long discussion today regarding chronic pain medication use and risks for dependence and tolerance. Discussed and explained the violation of her pain contract this summer, she was unaware that her urgent care visit violated the contracted.   Given persistent pain, will provide a refill of Ultracet temporarily until she meets with orthopedist to discuss surgery. UDS and controlled substance contract signed today. Discussed that if she violated this contract then I would no longer prescribe Tramadol. Discussed to use this medication sparingly and to refrain from extra use of tylenol.  Patient verbalized understanding and agreement of plan.

## 2015-12-30 NOTE — Patient Instructions (Addendum)
I sent refills for Atenolol and Pravastatin to your pharmacy.   Stop by the lab to sign our controlled substance contract and to provide a urine drug screen sample.  I will refill your Tramadol but you will need to use this medication very sparingly for severe breakthrough pain. You may take it up to twice daily, but don't take it if you need it.  Try Hydroxyzine 25 mg tablets ONLY as needed for breakthrough anxiety. Take 1 tablet by mouth once daily as needed.  Please schedule a follow up visit in 2 weeks for re-evaluation of your blood pressure as it is too high. Monitor your blood pressure at home and bring your readings to your next appointment.  It was a pleasure to meet you today! Please don't hesitate to call me with any questions. Welcome to [facility]!

## 2015-12-30 NOTE — Progress Notes (Signed)
Pre visit review using our clinic review tool, if applicable. No additional management support is needed unless otherwise documented below in the visit note. 

## 2015-12-30 NOTE — Assessment & Plan Note (Signed)
Will refill Ultracet temporarily until she meets with orthopedist to discuss surgery in November. UDS and controlled substance contract obtained today. Review of [place] Controlled Substance Reporting today without evidence of abuse.

## 2015-12-30 NOTE — Assessment & Plan Note (Signed)
Refilled pravastatin. Lipids in June 2017 stable.

## 2015-12-30 NOTE — Assessment & Plan Note (Signed)
Significantly above goal today in clinic. Has been without Atenolol for 2 weeks. Refill provided today. Continue maxide. She will monitor BP and return in 2 weeks for re-evaluation.

## 2015-12-30 NOTE — Assessment & Plan Note (Signed)
Long term use of Benzodiazepine, has not had in over 1 month. Odd behavior today including repetitive questioning. When notified that I will not refill Alprazolam, patient became upset and raised her voice.  She needs daily treatment for anxiety on SSRI/SNRI. Patient will think about this as she declines today. Will consider stopping nortriptyline and start low dose Zoloft. GAD 7 score of 11 today.  Will have her trial low dose hydroxyzine to use PRN breakthrough anxiety for now. Will continue to monitor.

## 2015-12-30 NOTE — Progress Notes (Signed)
Subjective:    Patient ID: Felicia Little, female    DOB: [birth year], 71 y.o.   MRN: [id number]  HPI  Felicia Little is a 70 year old female who presents today to establish care and discuss the problems mentioned below. Will obtain old records. She is unsure of her last physical.  1) Essential Hypertension: Currently managed on Atenolol 50 mg and triamterene-HCTZ 75/50 mg. She's been out of her Atenolol 50 mg for over 1 week She has been taking her Maxide as prescribed (1/2 tablet once daily). She has noticed palpitations since she ran out of her Atenolol. Denies chest pain, shortness of breath. She checks her BP at home and gets readings of low 100's/80's when on her medication.    2) Osteoarthritis: Previously managed on narcotics but did break her pain contract in 2017. She presented to an Urgent Care in July and was prescribed Tramadol. She has also been referred to the pain clinic for further management for her chronic pain, but she refused further evaluation as she didn't believe the injections were helping. She is currently managed on Gabapentin 300 mg and Tramadol-acetaminophen 37.5-325 mg. She is running low on her Ultracet as her last Rx came from her orthopedist. She follows with her orthopedist every 3 months who would like to preform a surgery to her right knee. She is currently wearing a knee brace and is postponing surgery for now. She's been taking tylenol and aspirin for her pain with temporary improvement. She is requesting a refill of her Ultracet.  3) Generalized Anxiety Disorder: Currently managed on Alprazolam 0.5 mg for acute anxiety. Previously managed on three times daily, scheduled, and more recently twice daily. She broke her pain contract in Summer 2017 and was therefore weaned off of this medication. She's not had her Alprazolam in over one month and would like a refill. She endorses occasional anxiety with daily worry. GAD 7 score of 11 today.   She is currently managed on  nortriptyline but is unsure why she takes this medication. The medication "helps me get to sleep".   4) Hyperlipidemia: Currently managed on Pravastatin. Lipid panel in June 2017 stable. She has been out of her Pravastatin for 2 weeks. Denies myalgias. She is also managed on Aspirin 81 mg.   5) Renal Insufficiency: Creatinine of 0.95 with GFR of 70 in June 2017 which was an overall improvement. She is unaware of her history of decreased renal function but has avoided NSAIDS.    Review of Systems  Constitutional: Negative for fatigue and unexpected weight change.  HENT: Negative for rhinorrhea.   Eyes: Negative for visual disturbance.  Respiratory: Negative for shortness of breath.   Cardiovascular: Positive for palpitations. Negative for chest pain.  Genitourinary:       Hysterectomy   Musculoskeletal: Positive for arthralgias.  Allergic/Immunologic: Negative for environmental allergies.  Neurological: Negative for headaches.  Psychiatric/Behavioral: Negative for sleep disturbance and suicidal ideas. The patient is nervous/anxious.        Past Medical History:  Diagnosis Date  . Anxiety   . Arthritis    "all over" (04/06/2012)  . Chronic lower back pain   . Chronic prescription benzodiazepine use 09/03/2015  . Depression   . [id number])    "maybe once/week" (04/06/2012) - none recently  . Hx of smoking 10/22/2014  . Hypertension   . Kidney stones   . Pneumonia ~ 1995; 1996  . Renal insufficiency 09/03/2015     Social History   Social History  .  Marital status: Divorced    Spouse name: N/A  . Number of children: N/A  . Years of education: N/A   Occupational History  . Not on file.   Social History Main Topics  . Smoking status: Former Smoker    Packs/day: 0.50    Years: 33.00    Types: Cigarettes    Quit date: 05/02/2014  . Smokeless tobacco: Never Used  . Alcohol use No     Comment: 04/06/2012 "tried alcohol once; didn't like it; never tried it again"  . Drug use:  No  . Sexual activity: No   Other Topics Concern  . Not on file   Social History Narrative  . No narrative on file    Past Surgical History:  Procedure Laterality Date  . ANTERIOR HIP REVISION Right 01/26/2015   Procedure: REVISION RIGHT TOTAL HIP ARTHROPLASTY ACETABULAR COMPONENTS  ANTERIOR APPROACH;  Surgeon: Rod Can, MD;  Location: [place];  Service: Orthopedics;  Laterality: Right;  . CARPAL TUNNEL RELEASE  1990's   "right" (04/06/2012)  . CHOLECYSTECTOMY  1990's  . JOINT REPLACEMENT     BIL  HIP  REPLACEMENTS   . KNEE ARTHROSCOPY  1990's   "right" (04/06/2012)  . LUMBAR WOUND DEBRIDEMENT  04/11/2012   Procedure: LUMBAR WOUND DEBRIDEMENT;  Surgeon: Eustace Moore, MD;  Location: [facility];  Service: Neurosurgery;  Laterality: N/A;  Irrigation and debridement of lumbar wound, Repair of pseudomeningocele not requiring laminectomy  . POSTERIOR LUMBAR FUSION  04/06/2012  . TOTAL HIP ARTHROPLASTY  2005; 2007   "right; left" (04/06/2012)  . VAGINAL HYSTERECTOMY  1979    Family History  Problem Relation Age of Onset  . Heart attack Mother   . Alzheimer's disease Mother   . Hypertension Daughter     No Known Allergies  Current Outpatient Prescriptions on File Prior to Visit  Medication Sig Dispense Refill  . aspirin EC 81 MG tablet Take 1 tablet (81 mg total) by mouth daily.    Marland Kitchen gabapentin (NEURONTIN) 300 MG capsule Take 2 capsules (600 mg total) by mouth at bedtime. 180 capsule 1  . nortriptyline (PAMELOR) 75 MG capsule Take 1 capsule (75 mg total) by mouth at bedtime. 90 capsule 1  . senna (SENOKOT) 8.6 MG TABS tablet Take 2 tablets (17.2 mg total) by mouth at bedtime. 60 each 3  . triamterene-hydrochlorothiazide (MAXZIDE) 75-50 MG tablet Take 0.5 tablets by mouth daily. 90 tablet 1   No current facility-administered medications on file prior to visit.     BP (!) 176/100   Pulse (!) 110   Temp 98.1 F (36.7 C) (Oral)   Ht 5\' 6"  (1.676 m)   Wt 173 lb (78.5 kg)   SpO2  95%   BMI 27.92 kg/m    Objective:   Physical Exam  Constitutional: She is oriented to person, place, and time. She appears well-nourished.  Neck: Neck supple.  Cardiovascular: Normal rate and regular rhythm.   Pulmonary/Chest: Effort normal and breath sounds normal.  Musculoskeletal:  Overall good ROM with cane and knee brace. Ambulatory in clinic without difficulty.  Neurological: She is alert and oriented to person, place, and time.  Repetitive questioning   Skin: Skin is warm and dry.  Psychiatric: She has a normal mood and affect.  Raised her voice during exam today, upset that Xanax would not be refilled. Also asking repetitive questions.          Assessment & Plan:

## 2015-12-30 NOTE — Assessment & Plan Note (Signed)
Repeat BMP in Summer 2017 stable. Continue to avoid nephrotoxic agents. Continue to monitor.

## 2015-12-31 ENCOUNTER — Telehealth: Payer: Self-pay | Admitting: Primary Care

## 2015-12-31 NOTE — Telephone Encounter (Signed)
Message left for patient to return my call.  

## 2015-12-31 NOTE — Telephone Encounter (Signed)
Pt returned your call.  

## 2015-12-31 NOTE — Telephone Encounter (Signed)
Spoken and notified patient of Kate's comments. Patient stated that the pill is too small to cut in half. Patient stated that her knee hurts so she can't go out. Patient stated why didn't Anda Kraft prescribed Zoloft in the first place. Patient then talk about something else and didn't give me a straight confirm answer regarding Zoloft.

## 2015-12-31 NOTE — Telephone Encounter (Signed)
Please notify patient that she may cut the hydroxyzine tablet in half as this will cause less drowsiness. I'm happy to initiate a medication called Zoloft to help with her daily anxiety symptoms as discussed yesterday. I will not be providing her with a prescription for Xanax.

## 2015-12-31 NOTE — Telephone Encounter (Signed)
Spoken to patient and she stated that hydroxyzine is not working for her. Patient stated it make her drowsy and she does not like it. Patient stated that Xanax works better for her.

## 2015-12-31 NOTE — Telephone Encounter (Signed)
Please notify patient that I am happy to send in a lower dose of hydroxyzine if she cannot cut the tablet. Please confirm with her regarding Zoloft. If she's agreeable then I need to see her for follow up in 6 weeks. How's her blood pressure? Has she resumed her BP medication?

## 2016-01-01 NOTE — Telephone Encounter (Signed)
Message left for patient to return my call.  

## 2016-01-04 NOTE — Telephone Encounter (Signed)
Spoken to patient and she stated that she does not another Rx for hydroxyzine or Zoloft. Patient doe snot believe that she can afford it.   Patient's BP went down. The reading has been around 129/78 and patient has start taking BP medication again. Patient stated that she can't afford to come back in 2 weeks for the follow up.  Patient did schedule a follow up on 02/19/2016.  Patient also wanted Anda Kraft to know that patient will be calling ortho because the knee is getting worse. She is going to let them know that she would like to go ahead with the surgery.

## 2016-01-04 NOTE — Telephone Encounter (Signed)
Noted  

## 2016-01-25 ENCOUNTER — Encounter: Payer: Self-pay | Admitting: Primary Care

## 2016-02-10 DIAGNOSIS — H25813 Combined forms of age-related cataract, bilateral: Secondary | ICD-10-CM | POA: Diagnosis not present

## 2016-02-12 ENCOUNTER — Other Ambulatory Visit: Payer: Self-pay | Admitting: Family Medicine

## 2016-02-15 NOTE — Telephone Encounter (Signed)
Patient left this practice; established with another provider; no further refills of Xanax -- DENIED

## 2016-02-16 ENCOUNTER — Ambulatory Visit: Payer: Medicare Other | Admitting: Primary Care

## 2016-02-19 ENCOUNTER — Ambulatory Visit: Payer: Medicare Other | Admitting: Primary Care

## 2016-02-19 DIAGNOSIS — G8929 Other chronic pain: Secondary | ICD-10-CM | POA: Diagnosis not present

## 2016-02-19 DIAGNOSIS — M25561 Pain in right knee: Secondary | ICD-10-CM | POA: Diagnosis not present

## 2016-02-19 DIAGNOSIS — M1711 Unilateral primary osteoarthritis, right knee: Secondary | ICD-10-CM | POA: Diagnosis not present

## 2016-02-23 ENCOUNTER — Ambulatory Visit (INDEPENDENT_AMBULATORY_CARE_PROVIDER_SITE_OTHER): Payer: Medicare Other | Admitting: Primary Care

## 2016-02-23 ENCOUNTER — Encounter: Payer: Self-pay | Admitting: Primary Care

## 2016-02-23 VITALS — BP 140/84 | HR 88 | Temp 97.7°F | Ht 66.0 in | Wt 171.4 lb

## 2016-02-23 DIAGNOSIS — M159 Polyosteoarthritis, unspecified: Secondary | ICD-10-CM

## 2016-02-23 DIAGNOSIS — Z79899 Other long term (current) drug therapy: Secondary | ICD-10-CM

## 2016-02-23 DIAGNOSIS — M1990 Unspecified osteoarthritis, unspecified site: Secondary | ICD-10-CM

## 2016-02-23 DIAGNOSIS — F418 Other specified anxiety disorders: Secondary | ICD-10-CM

## 2016-02-23 DIAGNOSIS — F329 Major depressive disorder, single episode, unspecified: Secondary | ICD-10-CM

## 2016-02-23 DIAGNOSIS — F419 Anxiety disorder, unspecified: Secondary | ICD-10-CM

## 2016-02-23 DIAGNOSIS — M1991 Primary osteoarthritis, unspecified site: Secondary | ICD-10-CM

## 2016-02-23 DIAGNOSIS — I10 Essential (primary) hypertension: Secondary | ICD-10-CM | POA: Diagnosis not present

## 2016-02-23 DIAGNOSIS — M150 Primary generalized (osteo)arthritis: Secondary | ICD-10-CM

## 2016-02-23 DIAGNOSIS — F32A Depression, unspecified: Secondary | ICD-10-CM

## 2016-02-23 MED ORDER — HYDROXYZINE HCL 25 MG PO TABS: ORAL_TABLET | ORAL | 1 refills | Status: DC

## 2016-02-23 MED ORDER — TRAMADOL-ACETAMINOPHEN 37.5-325 MG PO TABS: ORAL_TABLET | ORAL | 0 refills | Status: DC

## 2016-02-23 MED ORDER — NORTRIPTYLINE HCL 75 MG PO CAPS: 75.0000 mg | ORAL_CAPSULE | Freq: Every day | ORAL | 1 refills | Status: DC

## 2016-02-23 MED ORDER — GABAPENTIN 300 MG PO CAPS: 600.0000 mg | ORAL_CAPSULE | Freq: Every day | ORAL | 1 refills | Status: DC

## 2016-02-23 NOTE — Patient Instructions (Addendum)
I sent refills for gabapentin, nortriptyline, and hydroxyzine.  Continue to take the hydroxyzine at bedtime for anxiety and sleep.  Please schedule a physical with me in 6 months. You may also schedule a lab only appointment 3-4 days prior. We will discuss your lab results in detail during your physical.  It was a pleasure to see you today!

## 2016-02-23 NOTE — Progress Notes (Signed)
Pre visit review using our clinic review tool, if applicable. No additional management support is needed unless otherwise documented below in the visit note. 

## 2016-02-23 NOTE — Progress Notes (Signed)
Subjective:    Patient ID: Felicia Little, female    DOB: 10-06-1945, 70 y.o.   MRN: [id number]  HPI  Felicia Little is a 70 year old female who presents today for follow up.  1) Essential Hypertension: Established care in September 2017. Blood pressure that day was above goal at 176/100. She had been out of her medication for the past 2 weeks. She was called several weeks later and endorsed "normal" blood pressure readings. She is currently managed on Atenolol and Maxzide. Her BP in the office today is 140/84. She denies chest pain, headaches, visual changes.  2) Osteoarthritis: Currently following with orthopedics. She was told that she would need knee surgery. She is managed on Ultracet by prior PCP for chronic pain. She was seen by her orthopedist Friday last week and the plan is to complete right knee surgery in January 2018. She would like a refill of her Ultracet.  3) Anxiety: Currently managed on hydroxyzine 25 mg for breakthrough anxiety and sleep. Without the hydroxyzine she has difficulty falling asleep and will wake during the night to use the bathroom. She will need a refill of this medication.  Review of Systems  Eyes: Negative for visual disturbance.  Respiratory: Negative for shortness of breath.   Cardiovascular: Negative for chest pain.  Musculoskeletal: Positive for arthralgias.  Neurological: Negative for dizziness and headaches.  Psychiatric/Behavioral: Negative for sleep disturbance. The patient is not nervous/anxious.        Past Medical History:  Diagnosis Date  . Anxiety   . Arthritis    "all over" (04/06/2012)  . Chronic lower back pain   . Chronic prescription benzodiazepine use 09/03/2015  . Depression   . [id number])    "maybe once/week" (04/06/2012) - none recently  . Hx of smoking 10/22/2014  . Hypertension   . Kidney stones   . Pneumonia ~ 1995; 1996  . Renal insufficiency 09/03/2015     Social History   Social History  . Marital status: Divorced   Spouse name: N/A  . Number of children: N/A  . Years of education: N/A   Occupational History  . Not on file.   Social History Main Topics  . Smoking status: Former Smoker    Packs/day: 0.50    Years: 33.00    Types: Cigarettes    Quit date: 05/02/2014  . Smokeless tobacco: Never Used  . Alcohol use No     Comment: 04/06/2012 "tried alcohol once; didn't like it; never tried it again"  . Drug use: No  . Sexual activity: No   Other Topics Concern  . Not on file   Social History Narrative  . No narrative on file    Past Surgical History:  Procedure Laterality Date  . ANTERIOR HIP REVISION Right 01/26/2015   Procedure: REVISION RIGHT TOTAL HIP ARTHROPLASTY ACETABULAR COMPONENTS  ANTERIOR APPROACH;  Surgeon: Rod Can, MD;  Location: [facility];  Service: Orthopedics;  Laterality: Right;  . CARPAL TUNNEL RELEASE  1990's   "right" (04/06/2012)  . CHOLECYSTECTOMY  1990's  . JOINT REPLACEMENT     BIL  HIP  REPLACEMENTS   . KNEE ARTHROSCOPY  1990's   "right" (04/06/2012)  . LUMBAR WOUND DEBRIDEMENT  04/11/2012   Procedure: LUMBAR WOUND DEBRIDEMENT;  Surgeon: Eustace Moore, MD;  Location: [facility];  Service: Neurosurgery;  Laterality: N/A;  Irrigation and debridement of lumbar wound, Repair of pseudomeningocele not requiring laminectomy  . POSTERIOR LUMBAR FUSION  04/06/2012  . TOTAL HIP  ARTHROPLASTY  2005; 2007   "right; left" (04/06/2012)  . VAGINAL HYSTERECTOMY  1979    Family History  Problem Relation Age of Onset  . Heart attack Mother   . Alzheimer's disease Mother   . Hypertension Daughter     No Known Allergies  Current Outpatient Prescriptions on File Prior to Visit  Medication Sig Dispense Refill  . aspirin EC 81 MG tablet Take 1 tablet (81 mg total) by mouth daily.    Marland Kitchen atenolol (TENORMIN) 50 MG tablet Take 1 tablet (50 mg total) by mouth daily. 90 tablet 1  . pravastatin (PRAVACHOL) 20 MG tablet Take 1 tablet (20 mg total) by mouth at bedtime. 90 tablet 1  .  triamterene-hydrochlorothiazide (MAXZIDE) 75-50 MG tablet Take 0.5 tablets by mouth daily. 90 tablet 1   No current facility-administered medications on file prior to visit.     BP 140/84   Pulse 88   Temp 97.7 F (36.5 C) (Oral)   Ht 5\' 6"  (1.676 m)   Wt 171 lb 6.4 oz (77.7 kg)   SpO2 96%   BMI 27.66 kg/m    Objective:   Physical Exam  Constitutional: She appears well-nourished.  Cardiovascular: Normal rate and regular rhythm.   Pulmonary/Chest: Effort normal and breath sounds normal.  Skin: Skin is warm and dry.  Psychiatric: She has a normal mood and affect.          Assessment & Plan:

## 2016-02-24 NOTE — Assessment & Plan Note (Signed)
Improved in the office today. Continue current regimen.

## 2016-02-24 NOTE — Assessment & Plan Note (Signed)
Mostly right knee, needs knee replacement which is scheduled for January 2018. Refill provided for Ultracet.

## 2016-02-24 NOTE — Assessment & Plan Note (Signed)
Mostly located to right knee, to be undergoing right knee surgery in Jauary 2018. Refill for Ultracet provided.

## 2016-02-24 NOTE — Assessment & Plan Note (Signed)
Completely weaned off at last visit. Will not prescribe again.

## 2016-02-24 NOTE — Assessment & Plan Note (Signed)
Improved with hydroxyzine. Declines SSRI/other daily anti-anxiety medications. Will not provide her with Benzos.

## 2016-03-07 ENCOUNTER — Ambulatory Visit: Payer: Medicare Other | Admitting: Family Medicine

## 2016-05-17 DIAGNOSIS — M25562 Pain in left knee: Secondary | ICD-10-CM | POA: Diagnosis not present

## 2016-05-17 DIAGNOSIS — M170 Bilateral primary osteoarthritis of knee: Secondary | ICD-10-CM | POA: Diagnosis not present

## 2016-05-17 DIAGNOSIS — M25561 Pain in right knee: Secondary | ICD-10-CM | POA: Diagnosis not present

## 2016-06-06 ENCOUNTER — Encounter: Payer: Self-pay | Admitting: Primary Care

## 2016-06-06 ENCOUNTER — Ambulatory Visit (INDEPENDENT_AMBULATORY_CARE_PROVIDER_SITE_OTHER): Payer: Medicare Other | Admitting: Primary Care

## 2016-06-06 ENCOUNTER — Encounter (INDEPENDENT_AMBULATORY_CARE_PROVIDER_SITE_OTHER): Payer: Self-pay

## 2016-06-06 DIAGNOSIS — F419 Anxiety disorder, unspecified: Secondary | ICD-10-CM | POA: Diagnosis not present

## 2016-06-06 DIAGNOSIS — F418 Other specified anxiety disorders: Secondary | ICD-10-CM

## 2016-06-06 DIAGNOSIS — F32A Depression, unspecified: Secondary | ICD-10-CM

## 2016-06-06 DIAGNOSIS — I10 Essential (primary) hypertension: Secondary | ICD-10-CM

## 2016-06-06 DIAGNOSIS — M1990 Unspecified osteoarthritis, unspecified site: Secondary | ICD-10-CM

## 2016-06-06 DIAGNOSIS — M150 Primary generalized (osteo)arthritis: Secondary | ICD-10-CM

## 2016-06-06 DIAGNOSIS — M159 Polyosteoarthritis, unspecified: Secondary | ICD-10-CM

## 2016-06-06 DIAGNOSIS — F329 Major depressive disorder, single episode, unspecified: Secondary | ICD-10-CM

## 2016-06-06 MED ORDER — HYDROXYZINE HCL 25 MG PO TABS: ORAL_TABLET | ORAL | 1 refills | Status: DC

## 2016-06-06 MED ORDER — ATENOLOL 50 MG PO TABS: 50.0000 mg | ORAL_TABLET | Freq: Every day | ORAL | 2 refills | Status: DC

## 2016-06-06 MED ORDER — TRAMADOL-ACETAMINOPHEN 37.5-325 MG PO TABS: ORAL_TABLET | ORAL | 0 refills | Status: DC

## 2016-06-06 NOTE — Assessment & Plan Note (Signed)
Stable on recheck in the office today. Continue atenolol and Maxzide.

## 2016-06-06 NOTE — Assessment & Plan Note (Signed)
Continues to decline daily anti-anxiety meds. Refilled hydroxyzine. Offered condolences to the recent passing of her family member.

## 2016-06-06 NOTE — Progress Notes (Signed)
Pre visit review using our clinic review tool, if applicable. No additional management support is needed unless otherwise documented below in the visit note. 

## 2016-06-06 NOTE — Assessment & Plan Note (Signed)
Doing well with Ultracet, also following with ortho for knee injections. Refill of Ultracet provided for a three month's supply.

## 2016-06-06 NOTE — Patient Instructions (Signed)
Call your insurance regarding 90 day supply on the nortriptyline, we have been prescribing this in 90 day increments.   Check your blood pressure daily, around the same time of day, for the next several weeks.  Ensure that you have rested for 30 minutes prior to checking your blood pressure. Record your readings and I'll call you for those readings in several weeks.  Please schedule a physical with me in 6 months. You may also schedule a lab only appointment 3-4 days prior. We will discuss your lab results in detail during your physical.  It was a pleasure to see you today!
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
## 2016-06-20 ENCOUNTER — Other Ambulatory Visit: Payer: Self-pay | Admitting: Primary Care

## 2016-06-20 DIAGNOSIS — E785 Hyperlipidemia, unspecified: Secondary | ICD-10-CM

## 2016-06-20 NOTE — Telephone Encounter (Signed)
Ok to refill? Electronically refill request for pravastatin (PRAVACHOL) 20 MG tablet. Last prescribed on 12/30/2015. Last seen on 06/06/2016.

## 2016-06-28 DIAGNOSIS — M1711 Unilateral primary osteoarthritis, right knee: Secondary | ICD-10-CM | POA: Diagnosis not present

## 2016-06-28 DIAGNOSIS — M25561 Pain in right knee: Secondary | ICD-10-CM | POA: Diagnosis not present

## 2016-07-04 ENCOUNTER — Other Ambulatory Visit: Payer: Self-pay | Admitting: Primary Care

## 2016-07-04 DIAGNOSIS — Z1231 Encounter for screening mammogram for malignant neoplasm of breast: Secondary | ICD-10-CM

## 2016-07-06 DIAGNOSIS — M1711 Unilateral primary osteoarthritis, right knee: Secondary | ICD-10-CM | POA: Diagnosis not present

## 2016-07-06 DIAGNOSIS — M25561 Pain in right knee: Secondary | ICD-10-CM | POA: Diagnosis not present

## 2016-07-13 DIAGNOSIS — M1711 Unilateral primary osteoarthritis, right knee: Secondary | ICD-10-CM | POA: Diagnosis not present

## 2016-07-13 DIAGNOSIS — M25561 Pain in right knee: Secondary | ICD-10-CM | POA: Diagnosis not present

## 2016-07-28 ENCOUNTER — Ambulatory Visit
Admission: RE | Admit: 2016-07-28 | Discharge: 2016-07-28 | Disposition: A | Discharge status: Home / Self Care | Payer: Medicare Other | Source: Ambulatory Visit | Attending: Primary Care | Admitting: Primary Care

## 2016-07-28 DIAGNOSIS — Z1231 Encounter for screening mammogram for malignant neoplasm of breast: Secondary | ICD-10-CM | POA: Diagnosis not present

## 2016-08-11 ENCOUNTER — Telehealth: Payer: Self-pay

## 2016-08-11 NOTE — Telephone Encounter (Signed)
Pt last seen 06/06/16; pt is checking in to moving and her nerves are bothering her; pt stopped smoking 3 years ago with aide of chantix. Pt has started back smoking and request chantix to help her stop smoking again. [facility] [address] [place] Please advise.

## 2016-08-12 NOTE — Telephone Encounter (Signed)
Is she moving out of state, another city, or just near by? Did she have any problem with Chantix in the past? Is she aware of the potential side effects including vivid nightmares, sleep walking, suicidal thoughts, headache? Did she experience any of those effects in the past? Is she familiar on how to start the medication? I just need to know this information before we send in the medication.

## 2016-08-16 MED ORDER — VARENICLINE TARTRATE 0.5 MG X 11 & 1 MG X 42 PO MISC: ORAL | 0 refills | Status: DC

## 2016-08-16 NOTE — Telephone Encounter (Signed)
RX printed, please fax to pharmacy

## 2016-08-16 NOTE — Telephone Encounter (Signed)
Faxed Rx to [facility] at [phone]

## 2016-08-16 NOTE — Telephone Encounter (Signed)
No, patient just moved into another apartment.  No, she did not have any problem with Chantix in the past.   She is aware of the potential side effects.   Yes, she is familiar on how to start the medication.  Patient stated that she would like Chantix send to [facility] as soon as possible, please.

## 2016-08-29 ENCOUNTER — Other Ambulatory Visit: Payer: Self-pay | Admitting: Primary Care

## 2016-08-29 DIAGNOSIS — M1991 Primary osteoarthritis, unspecified site: Secondary | ICD-10-CM

## 2016-10-07 ENCOUNTER — Telehealth: Payer: Self-pay

## 2016-10-07 MED ORDER — VARENICLINE TARTRATE 1 MG PO TABS: 1.0000 mg | ORAL_TABLET | Freq: Two times a day (BID) | ORAL | 0 refills | Status: DC

## 2016-10-07 NOTE — Telephone Encounter (Signed)
Received a fax from [facility] asking for a Rx for a Chantix Continuation Pack. Can you approve in Kate's absence?

## 2016-10-07 NOTE — Telephone Encounter (Signed)
Patient advised.

## 2016-10-07 NOTE — Telephone Encounter (Signed)
Sent.  If any adverse effect on med then notify us.  Thanks.

## 2016-11-08 ENCOUNTER — Telehealth: Payer: Self-pay | Admitting: Primary Care

## 2016-11-08 NOTE — Telephone Encounter (Signed)
Left pt message asking to call Allison back directly at [phone] to schedule AWV + labs with Lesia and CPE with PCP. ° °*NOTE* Never had AWV before °

## 2016-11-09 NOTE — Telephone Encounter (Signed)
I scheduled patient's appointments for Felicia Little + Labs and follow up with Anda Kraft in September.

## 2016-11-21 ENCOUNTER — Other Ambulatory Visit: Payer: Self-pay | Admitting: Family Medicine

## 2016-12-09 ENCOUNTER — Other Ambulatory Visit: Payer: Self-pay | Admitting: Primary Care

## 2016-12-09 DIAGNOSIS — E785 Hyperlipidemia, unspecified: Secondary | ICD-10-CM

## 2016-12-09 DIAGNOSIS — I10 Essential (primary) hypertension: Secondary | ICD-10-CM

## 2016-12-09 DIAGNOSIS — Z1159 Encounter for screening for other viral diseases: Secondary | ICD-10-CM

## 2016-12-14 ENCOUNTER — Ambulatory Visit (INDEPENDENT_AMBULATORY_CARE_PROVIDER_SITE_OTHER): Payer: Medicare Other

## 2016-12-14 ENCOUNTER — Other Ambulatory Visit: Payer: Self-pay

## 2016-12-14 VITALS — BP 120/80 | HR 55 | Temp 98.8°F | Ht 65.5 in | Wt 174.0 lb

## 2016-12-14 DIAGNOSIS — M1990 Unspecified osteoarthritis, unspecified site: Secondary | ICD-10-CM

## 2016-12-14 DIAGNOSIS — Z0000 Encounter for general adult medical examination without abnormal findings: Secondary | ICD-10-CM

## 2016-12-14 DIAGNOSIS — E785 Hyperlipidemia, unspecified: Secondary | ICD-10-CM | POA: Diagnosis not present

## 2016-12-14 DIAGNOSIS — I10 Essential (primary) hypertension: Secondary | ICD-10-CM | POA: Diagnosis not present

## 2016-12-14 DIAGNOSIS — Z1159 Encounter for screening for other viral diseases: Secondary | ICD-10-CM | POA: Diagnosis not present

## 2016-12-14 LAB — LIPID PANEL
CHOLESTEROL: 174 mg/dL (ref 0–200)
HDL: 60.2 mg/dL (ref >39.00)
LDL CALC: 86 mg/dL (ref 0–99)
NONHDL: 114.17
Total CHOL/HDL Ratio: 3
Triglycerides: 140 mg/dL (ref 0.0–149.0)
VLDL: 28 mg/dL (ref 0.0–40.0)

## 2016-12-14 LAB — COMPREHENSIVE METABOLIC PANEL
ALBUMIN: 4.1 g/dL (ref 3.5–5.2)
ALK PHOS: 71 U/L (ref 39–117)
ALT: 10 U/L (ref 0–35)
AST: 16 U/L (ref 0–37)
BUN: 13 mg/dL (ref 6–23)
CHLORIDE: 103 meq/L (ref 96–112)
CO2: 29 mEq/L (ref 19–32)
CREATININE: 1.04 mg/dL (ref 0.40–1.20)
Calcium: 9.6 mg/dL (ref 8.4–10.5)
GFR: 66.75 mL/min (ref >60.00)
GLUCOSE: 79 mg/dL (ref 70–99)
POTASSIUM: 3.8 meq/L (ref 3.5–5.1)
SODIUM: 139 meq/L (ref 135–145)
TOTAL PROTEIN: 7.6 g/dL (ref 6.0–8.3)
Total Bilirubin: 0.7 mg/dL (ref 0.2–1.2)

## 2016-12-14 MED ORDER — TRAMADOL-ACETAMINOPHEN 37.5-325 MG PO TABS: ORAL_TABLET | ORAL | 0 refills | Status: DC

## 2016-12-14 NOTE — Telephone Encounter (Signed)
Patient was here today for AWV. Patient has requested refill for Tramadol-acetaminophen 37.5-325 mg. Please send prescription to [facility].

## 2016-12-14 NOTE — Telephone Encounter (Signed)
Okay to refill as noted on chart. #180, no refills. Please phone in. Ensure UDS and controlled substance contract up-to-date.

## 2016-12-14 NOTE — Progress Notes (Signed)
PCP notes:   Health maintenance:  Flu vaccine - addressed PNA vaccines - addressed Bone density - PCP please discuss at next appt Colon cancer screening - patient declined Tetanus vaccine - addressed Hep C screening - completed  Note: Patient was provided vaccine information sheets on flu, PCV13, and PPSV23 vaccines. Patient was asked to review this information and discuss with PCP at next appt.  Abnormal screenings:   Depression score: 13 Fall risk - hx of multiple falls without injury Mini-Cog score: 19/20  Patient concerns:   Frequent urination at night - patient reports for previous 6 mths urination has increased at night.   Medication refill request: Tramadol-acetaminophen. Request submitted to PCP.   Nurse concerns:  None  Next PCP appt:   12/19/2016 @ 1445

## 2016-12-14 NOTE — Progress Notes (Signed)
Subjective:   Felicia Little is a 71 y.o. female who presents for an Initial Medicare Annual Wellness Visit.  Review of Systems    N/A  Cardiac Risk Factors include: advanced age (>47men, >32 women);hypertension;dyslipidemia     Objective:    Today's Vitals   12/14/16 1424 12/14/16 1428  BP: 120/80   Pulse: (!) 55   Temp: 98.8 F (37.1 C)   TempSrc: Oral   SpO2: 99%   Weight: 174 lb (78.9 kg)   Height: 5' 5.5" (1.664 m)   PainSc: 8  8   PainLoc: Knee    Body mass index is 28.51 kg/m.   Current Medications (verified) Outpatient Encounter Prescriptions as of 12/14/2016  Medication Sig  . aspirin EC 81 MG tablet Take 1 tablet (81 mg total) by mouth daily.  Marland Kitchen atenolol (TENORMIN) 50 MG tablet Take 1 tablet (50 mg total) by mouth daily.  Marland Kitchen gabapentin (NEURONTIN) 300 MG capsule TAKE TWO CAPSULES BY MOUTH AT BEDTIME  . hydrOXYzine (ATARAX/VISTARIL) 25 MG tablet Take 1 tablet by mouth twice daily as needed for anxiety.  . nortriptyline (PAMELOR) 75 MG capsule TAKE ONE CAPSULE BY MOUTH ONCE DAILY AT BEDTIME  . pravastatin (PRAVACHOL) 20 MG tablet TAKE ONE TABLET BY MOUTH AT BEDTIME  . triamterene-hydrochlorothiazide (MAXZIDE) 75-50 MG tablet Take 0.5 tablets by mouth daily. (Patient taking differently: Take 0.5 tablets by mouth as needed. )  . traMADol-acetaminophen (ULTRACET) 37.5-325 MG tablet Take 1 tablet by mouth twice daily as needed for breakthrough pain.  . [DISCONTINUED] varenicline (CHANTIX CONTINUING MONTH PAK) 1 MG tablet Take 1 tablet (1 mg total) by mouth 2 (two) times daily.   No facility-administered encounter medications on file as of 12/14/2016.     Allergies (verified) Patient has no known allergies.   History: Past Medical History:  Diagnosis Date  . Anxiety   . Arthritis    "all over" (04/06/2012)  . Chronic lower back pain   . Chronic prescription benzodiazepine use 09/03/2015  . Depression   . ONGEXBMW(413.2)    "maybe once/week" (04/06/2012) - none  recently  . Hx of smoking 10/22/2014  . Hypertension   . Kidney stones   . Pneumonia ~ 1995; 1996  . Renal insufficiency 09/03/2015   Past Surgical History:  Procedure Laterality Date  . ANTERIOR HIP REVISION Right 01/26/2015   Procedure: REVISION RIGHT TOTAL HIP ARTHROPLASTY ACETABULAR COMPONENTS  ANTERIOR APPROACH;  Surgeon: Rod Can, MD;  Location: [facility];  Service: Orthopedics;  Laterality: Right;  . CARPAL TUNNEL RELEASE  1990's   "right" (04/06/2012)  . CHOLECYSTECTOMY  1990's  . JOINT REPLACEMENT     BIL  HIP  REPLACEMENTS   . KNEE ARTHROSCOPY  1990's   "right" (04/06/2012)  . 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 POSTERIOR LUMBAR FUSION  04/06/2012  . TOTAL HIP ARTHROPLASTY  2005; 2007   "right; left" (04/06/2012)  . VAGINAL HYSTERECTOMY  1979   Family History  Problem Relation Age of Onset  . Heart attack Mother   . Alzheimer's disease Mother   . Hypertension Daughter    Social History   Occupational History  . Not on file.   Social History Main Topics  . Smoking status: Former Smoker    Packs/day: 0.50    Years:  33.00    Types: Cigarettes    Quit date: 05/02/2014  . Smokeless tobacco: Never Used  . Alcohol use No     Comment: 04/06/2012 "tried alcohol once; didn't like it; never tried it again"  . Drug use: No  . Sexual activity: No    Tobacco Counseling Counseling given: No   Activities of Daily Living In your present state of health, do you have any difficulty performing the following activities: 12/14/2016  Hearing? N  Vision? N  Difficulty concentrating or making decisions? N  Walking or climbing stairs? Y  Dressing or bathing? N  Doing errands, shopping? N  Preparing Food and eating ? N  Using the Toilet? N  In the past  six months, have you accidently leaked urine? N  Do you have problems with loss of bowel control? N  Managing your Medications? N  Managing your Finances? N  Housekeeping or managing your Housekeeping? N  Some recent data might be hidden    Immunizations and Health Maintenance Immunization History  Administered Date(s) Administered  . Influenza, High Dose Seasonal PF 01/21/2015  . Influenza-Unspecified 03/04/2012   There are no preventive care reminders to display for this patient.  Patient Care Team: Pleas Koch, NP as PCP - General (Internal Medicine)  Indicate any recent Medical Services you may have received from other than [facility] providers in the past year (date may be approximate).     Assessment:   This is a routine wellness examination for Felicia Little.   Hearing/Vision screen  Hearing Screening   125Hz  250Hz  500Hz  1000Hz  2000Hz  3000Hz  4000Hz  6000Hz  8000Hz   Right ear:   40 40 40  40    Left ear:   40 40 40  40    Vision Screening Comments: Last vision exam in Oct or Nov 2017  Dietary issues and exercise activities discussed: Current Exercise Habits: The patient does not participate in regular exercise at present, Exercise limited by: orthopedic condition(s)  Goals    . Increase water intake          Starting 12/14/2016, I will continue to drink at least 6-8 glasses of water daily.      Depression Screen PHQ 2/9 Scores 12/14/2016 10/29/2015 09/03/2015 04/23/2015 01/21/2015  PHQ - 2 Score 4 0 0 0 0  PHQ- 9 Score 13 - - - -    Fall Risk Fall Risk  12/14/2016 10/29/2015 09/03/2015 04/23/2015 01/21/2015  Falls in the past year? Yes Yes No No No  Comment 3 falls due to tripping - - - -  Number falls in past yr: 2 or more 1 - - -  Injury with Fall? No - - - -    Cognitive Function: MMSE - Mini Mental State Exam 12/14/2016  Orientation to time 5  Orientation to Place 5  Registration 3  Attention/ Calculation 0  Recall 2  Recall-comments pt was unable to recall 1 of 3  words  Language- name 2 objects 0  Language- repeat 1  Language- follow 3 step command 3  Language- read & follow direction 0  Write a sentence 0  Copy design 0  Total score 19     PLEASE NOTE: A Mini-Cog screen was completed. Maximum score is 20. A value of 0 denotes this part of [place] MMSE was not completed or the patient failed this part of the Mini-Cog screening.   Mini-Cog Screening Orientation to Time - Max 5 pts Orientation to Place - Max 5 pts Registration - Max  3 pts Recall - Max 3 pts Language Repeat - Max 1 pts Language Follow 3 Step Command - Max 3 pts     Screening Tests Health Maintenance  Topic Date Due  . INFLUENZA VACCINE  07/02/2017 (Originally 11/02/2016)  . DEXA SCAN  07/02/2017 (Originally 09/05/2008)  . PNA vac Low Risk Adult (1 of 2 - PCV13) 07/02/2017 (Originally 09/05/2008)  . COLONOSCOPY  12/15/2026 (Originally 09/05/1993)  . TETANUS/TDAP  12/15/2026 (Originally 09/06/1962)  . MAMMOGRAM  07/29/2018  . Hepatitis C Screening  Completed      Plan:    I have personally reviewed and addressed the Medicare Annual Wellness questionnaire and have noted the following in the patient's chart:  A. Medical and social history B. Use of alcohol, tobacco or illicit drugs  C. Current medications and supplements D. Functional ability and status E.  Nutritional status F.  Physical activity G. Advance directives H. List of other physicians I.  Hospitalizations, surgeries, and ER visits in previous 12 months J.  [place] to include hearing, vision, cognitive, depression L. Referrals and appointments - none  In addition, I have reviewed and discussed with patient certain preventive protocols, quality metrics, and best practice recommendations. A written personalized care plan for preventive services as well as general preventive health recommendations were provided to patient.  See attached scanned questionnaire for additional information.   Signed,    Lindell Noe, MHA, BS, LPN Health Coach

## 2016-12-14 NOTE — Progress Notes (Signed)
Pre visit review using our clinic review tool, if applicable. No additional management support is needed unless otherwise documented below in the visit note. 

## 2016-12-14 NOTE — Patient Instructions (Signed)
[name] , Thank you for taking time to come for your Medicare Wellness Visit. I appreciate your ongoing commitment to your health goals. Please review the following plan we discussed and let me know if I can assist you in the future.   These are the goals we discussed: Goals    . Increase water intake          Starting 12/14/2016, I will continue to drink at least 6-8 glasses of water daily.       This is a list of the screening recommended for you and due dates:  Health Maintenance  Topic Date Due  . Flu Shot  07/02/2017*  . DEXA scan (bone density measurement)  07/02/2017*  . Pneumonia vaccines (1 of 2 - PCV13) 07/02/2017*  . Colon Cancer Screening  12/15/2026*  . Tetanus Vaccine  12/15/2026*  . Mammogram  07/29/2018  .  Hepatitis C: One time screening is recommended by Center for Disease Control  (CDC) for  adults born from [birth year] through 1967.   Completed  *Topic was postponed. The date shown is not the original due date.   Preventive Care for Adults  A healthy lifestyle and preventive care can promote health and wellness. Preventive health guidelines for adults include the following key practices.  . A routine yearly physical is a good way to check with your health care provider about your health and preventive screening. It is a chance to share any concerns and updates on your health and to receive a thorough exam.  . Visit your dentist for a routine exam and preventive care every 6 months. Brush your teeth twice a day and floss once a day. Good oral hygiene prevents tooth decay and gum disease.  . The frequency of eye exams is based on your age, health, family medical history, use  of contact lenses, and other factors. Follow your health care provider's ecommendations for frequency of eye exams.  . Eat a healthy diet. Foods like vegetables, fruits, whole grains, low-fat dairy products, and lean protein foods contain the nutrients you need without too many calories. Decrease  your intake of foods high in solid fats, added sugars, and salt. Eat the right amount of calories for you. Get information about a proper diet from your health care provider, if necessary.  . Regular physical exercise is one of the most important things you can do for your health. Most adults should get at least 150 minutes of moderate-intensity exercise (any activity that increases your heart rate and causes you to sweat) each week. In addition, most adults need muscle-strengthening exercises on 2 or more days a week.  Silver Sneakers may be a benefit available to you. To determine eligibility, you may visit the website: www.silversneakers.com or contact program at 606-020-5570 Mon-Fri between 8AM-8PM.   . Maintain a healthy weight. The body mass index (BMI) is a screening tool to identify possible weight problems. It provides an estimate of body fat based on height and weight. Your health care provider can find your BMI and can help you achieve or maintain a healthy weight.   For adults 20 years and older: ? A BMI below 18.5 is considered underweight. ? A BMI of 18.5 to 24.9 is normal. ? A BMI of 25 to 29.9 is considered overweight. ? A BMI of 30 and above is considered obese.   . Maintain normal blood lipids and cholesterol levels by exercising and minimizing your intake of saturated fat. Eat a balanced diet with plenty  of fruit and vegetables. Blood tests for lipids and cholesterol should begin at age 28 and be repeated every 5 years. If your lipid or cholesterol levels are high, you are over 50, or you are at high risk for heart disease, you may need your cholesterol levels checked more frequently. Ongoing high lipid and cholesterol levels should be treated with medicines if diet and exercise are not working.  . If you smoke, find out from your health care provider how to quit. If you do not use tobacco, please do not start.  . If you choose to drink alcohol, please do not consume more than  2 drinks per day. One drink is considered to be 12 ounces (355 mL) of beer, 5 ounces (148 mL) of wine, or 1.5 ounces (44 mL) of liquor.  . If you are 70-53 years old, ask your health care provider if you should take aspirin to prevent strokes.  . Use sunscreen. Apply sunscreen liberally and repeatedly throughout the day. You should seek shade when your shadow is shorter than you. Protect yourself by wearing long sleeves, pants, a wide-brimmed hat, and sunglasses year round, whenever you are outdoors.  . Once a month, do a whole body skin exam, using a mirror to look at the skin on your back. Tell your health care provider of new moles, moles that have irregular borders, moles that are larger than a pencil eraser, or moles that have changed in shape or color.

## 2016-12-14 NOTE — Progress Notes (Signed)
I reviewed health advisor's note, was available for consultation, and agree with documentation and plan.  

## 2016-12-15 LAB — HEPATITIS C ANTIBODY
HEP C AB: NONREACTIVE
SIGNAL TO CUT-OFF: 0.03 (ref <1.00)

## 2016-12-15 NOTE — Telephone Encounter (Signed)
Called in medication to the pharmacy as instructed.  Will update UDS on office visit on 12/19/2016

## 2016-12-19 ENCOUNTER — Ambulatory Visit (INDEPENDENT_AMBULATORY_CARE_PROVIDER_SITE_OTHER): Payer: Medicare Other | Admitting: Primary Care

## 2016-12-19 ENCOUNTER — Encounter: Payer: Self-pay | Admitting: Primary Care

## 2016-12-19 VITALS — BP 134/78 | HR 63 | Temp 97.9°F | Ht 65.5 in | Wt 174.0 lb

## 2016-12-19 DIAGNOSIS — F419 Anxiety disorder, unspecified: Secondary | ICD-10-CM | POA: Diagnosis not present

## 2016-12-19 DIAGNOSIS — E785 Hyperlipidemia, unspecified: Secondary | ICD-10-CM

## 2016-12-19 DIAGNOSIS — Z0283 Encounter for blood-alcohol and blood-drug test: Secondary | ICD-10-CM | POA: Diagnosis not present

## 2016-12-19 DIAGNOSIS — G8929 Other chronic pain: Secondary | ICD-10-CM | POA: Diagnosis not present

## 2016-12-19 DIAGNOSIS — N289 Disorder of kidney and ureter, unspecified: Secondary | ICD-10-CM | POA: Diagnosis not present

## 2016-12-19 DIAGNOSIS — Z23 Encounter for immunization: Secondary | ICD-10-CM

## 2016-12-19 DIAGNOSIS — F32A Depression, unspecified: Secondary | ICD-10-CM

## 2016-12-19 DIAGNOSIS — M150 Primary generalized (osteo)arthritis: Secondary | ICD-10-CM

## 2016-12-19 DIAGNOSIS — M1991 Primary osteoarthritis, unspecified site: Secondary | ICD-10-CM | POA: Diagnosis not present

## 2016-12-19 DIAGNOSIS — I10 Essential (primary) hypertension: Secondary | ICD-10-CM | POA: Diagnosis not present

## 2016-12-19 DIAGNOSIS — E329 Disease of thymus, unspecified: Secondary | ICD-10-CM | POA: Diagnosis not present

## 2016-12-19 DIAGNOSIS — M159 Polyosteoarthritis, unspecified: Secondary | ICD-10-CM

## 2016-12-19 DIAGNOSIS — F329 Major depressive disorder, single episode, unspecified: Secondary | ICD-10-CM | POA: Diagnosis not present

## 2016-12-19 MED ORDER — NORTRIPTYLINE HCL 75 MG PO CAPS: ORAL_CAPSULE | ORAL | 3 refills | Status: DC

## 2016-12-19 MED ORDER — SERTRALINE HCL 25 MG PO TABS: ORAL_TABLET | ORAL | 0 refills | Status: DC

## 2016-12-19 MED ORDER — GABAPENTIN 300 MG PO CAPS: 600.0000 mg | ORAL_CAPSULE | Freq: Every day | ORAL | 3 refills | Status: DC

## 2016-12-19 MED ORDER — ATENOLOL 50 MG PO TABS: 50.0000 mg | ORAL_TABLET | Freq: Every day | ORAL | 3 refills | Status: DC

## 2016-12-19 MED ORDER — PRAVASTATIN SODIUM 20 MG PO TABS: 20.0000 mg | ORAL_TABLET | Freq: Every day | ORAL | 2 refills | Status: DC

## 2016-12-19 NOTE — Assessment & Plan Note (Addendum)
Hydroxyzine caused drowsiness, no longer taking.  Recent pH Q9 score of 13, endorsed symptoms of depression today.   Discussed treatment options, she declines therapy and would like to try medication. Rx for Zoloft 25 mg sent to pharmacy. Patient is to take 1/2 tablet daily for 8 days, then advance to 1 full tablet thereafter. We discussed possible side effects of headache, GI upset, drowsiness, and SI/HI. If thoughts of SI/HI develop, we discussed to present to the emergency immediately. Patient verbalized understanding.   We'll call her in 4 weeks for an update.

## 2016-12-19 NOTE — Assessment & Plan Note (Signed)
Recent BMP stable. Continue to avoid nephrotoxic agents.

## 2016-12-19 NOTE — Assessment & Plan Note (Addendum)
Chronic to knees, right shoulder, and hips. Taking tramadol with acetaminophen twice daily most days with improvement. UDS contract due today.

## 2016-12-19 NOTE — Patient Instructions (Signed)
Start sertraline (Zoloft) 25 mg tablets for anxiety and depression. Start by taking 1/2 tablet daily for 8 days then increase to 1 full tablet thereafter.  You were provided with flu and pneumonia vaccinations today.  Stop by the lab for the urine drug screen.  We will call to check on you in 4 weeks.  Follow up in 1 year or sooner if needed.  It was a pleasure to see you today!

## 2016-12-19 NOTE — Progress Notes (Signed)
Subjective:    Patient ID: Felicia Little, female    DOB: 1946-03-24, 71 y.o.   MRN: [id number]  HPI  Felicia Little is a 71 year old female who presents today for March ARB Part 2. She saw our health advisor last week.   Immunizations: -Tetanus: Completed in 2015 -Influenza: Due today.  -Pneumonia: Never completed.  -Shingles: Never completed.   Colonoscopy: Declines Dexa: Completed in 2015, she would like to defter until 2019 Pap Smear: Hysterectomy  Mammogram: Completed in April 2018 Hep C Screen: Negative  1) Osteoarthritis: Currently managed on tramadol-acetaminophen 37.5/325 mg and nortriptyline 75 mg. Overall feels well managed on this regimen. She is due for urine drug screen testing today.  2) Anxiety and Depression: Currently managed on nortriptyline, mostly for chronic pain. Previously managed on alprazolam TID for which she took scheduled. Alprazolam was removed by prior PCP due to breaking her controlled substance contract and therefore was not restarted in our clinic. She's experienced symptoms of feeling down, little motivation to do anything, doesn't want to see anyone. PHQ 9 score 13 last week with our health advisor during her Medicare wellness visit. She denies SI/HI.   Review of Systems  Constitutional: Negative for unexpected weight change.  HENT: Negative for rhinorrhea.   Respiratory: Negative for cough and shortness of breath.   Cardiovascular: Negative for chest pain.  Gastrointestinal: Negative for constipation and diarrhea.  Genitourinary: Negative for difficulty urinating.  Musculoskeletal: Positive for arthralgias. Negative for myalgias.  Skin: Negative for rash.  Allergic/Immunologic: Negative for environmental allergies.  Neurological: Negative for dizziness, numbness and headaches.  Psychiatric/Behavioral:       See HPI       Past Medical History:  Diagnosis Date  . Anxiety   . Arthritis    "all over" (04/06/2012)  . Chronic lower back pain   .  Chronic prescription benzodiazepine use 09/03/2015  . Depression   . JIRCVELF(810.1)    "maybe once/week" (04/06/2012) - none recently  . Hx of smoking 10/22/2014  . Hypertension   . Kidney stones   . Pneumonia ~ 1995; 1996  . Renal insufficiency 09/03/2015     Social History   Social History  . Marital status: Divorced    Spouse name: N/A  . Number of children: N/A  . Years of education: N/A   Occupational History  . Not on file.   Social History Main Topics  . Smoking status: Former Smoker    Packs/day: 0.50    Years: 33.00    Types: Cigarettes    Quit date: 05/02/2014  . Smokeless tobacco: Never Used  . Alcohol use No     Comment: 04/06/2012 "tried alcohol once; didn't like it; never tried it again"  . Drug use: No  . Sexual activity: No   Other Topics Concern  . Not on file   Social History Narrative  . No narrative on file    Past Surgical History:  Procedure Laterality Date  . ANTERIOR HIP REVISION Right 01/26/2015   Procedure: REVISION RIGHT TOTAL HIP ARTHROPLASTY ACETABULAR COMPONENTS  ANTERIOR APPROACH;  Surgeon: Rod Can, MD;  Location: [facility];  Service: Orthopedics;  Laterality: Right;  . CARPAL TUNNEL RELEASE  1990's   "right" (04/06/2012)  . CHOLECYSTECTOMY  1990's  . JOINT REPLACEMENT     BIL  HIP  REPLACEMENTS   . KNEE ARTHROSCOPY  1990's   "right" (04/06/2012)  . 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 POSTERIOR LUMBAR FUSION  04/06/2012  . TOTAL HIP ARTHROPLASTY  2005; 2007   "right; left" (04/06/2012)  . VAGINAL HYSTERECTOMY  1979    Family History  Problem Relation Age of Onset  . Heart attack Mother   . Alzheimer's disease Mother   . Hypertension Daughter     No Known Allergies  Current Outpatient Prescriptions on  File Prior to Visit  Medication Sig Dispense Refill  . aspirin EC 81 MG tablet Take 1 tablet (81 mg total) by mouth daily.    . traMADol-acetaminophen (ULTRACET) 37.5-325 MG tablet Take 1 tablet by mouth twice daily as needed for breakthrough pain. 180 tablet 0  . triamterene-hydrochlorothiazide (MAXZIDE) 75-50 MG tablet Take 0.5 tablets by mouth daily. (Patient taking differently: Take 0.5 tablets by mouth as needed. ) 90 tablet 1   No current facility-administered medications on file prior to visit.     BP 134/78   Pulse 63   Temp 97.9 F (36.6 C) (Oral)   Ht 5' 5.5" (1.664 m)   Wt 174 lb (78.9 kg)   SpO2 99%   BMI 28.51 kg/m    Objective:   Physical Exam  Constitutional: She is oriented to person, place, and time. She appears well-nourished.  HENT:  Right Ear: Tympanic membrane and ear canal normal.  Left Ear: Tympanic membrane and ear canal normal.  Nose: Nose normal.  Mouth/Throat: Oropharynx is clear and moist.  Eyes: Pupils are equal, round, and reactive to light. Conjunctivae and EOM are normal.  Neck: Neck supple. No thyromegaly present.  Cardiovascular: Normal rate and regular rhythm.   No murmur heard. Pulmonary/Chest: Effort normal and breath sounds normal. She has no rales.  Abdominal: Soft. Bowel sounds are normal. There is no tenderness.  Musculoskeletal:       Right shoulder: She exhibits decreased range of motion and pain.       Right knee: She exhibits decreased range of motion.  Chronic, stable decreased range of motion to right knee and right shoulder.  Lymphadenopathy:    She has no cervical adenopathy.  Neurological: She is alert and oriented to person, place, and time. She has normal reflexes. No cranial nerve deficit.  Skin: Skin is warm and dry. No rash noted.  Psychiatric: She has a normal mood and affect.          Assessment & Plan:

## 2016-12-19 NOTE — Assessment & Plan Note (Signed)
Secondary to arthritis, continue tramadol, UDS updated today.

## 2016-12-19 NOTE — Assessment & Plan Note (Signed)
Recent lipid panel unremarkable. Continue pravastatin.  

## 2016-12-19 NOTE — Assessment & Plan Note (Signed)
Stable in the office today, continue atenolol and triamterene-HCTZ. BMP unremarkable.

## 2016-12-20 LAB — PAIN MGMT, PROFILE 8 W/CONF, U
6 Acetylmorphine: NEGATIVE ng/mL (ref <10)
AMPHETAMINES: NEGATIVE ng/mL (ref <500)
Alcohol Metabolites: NEGATIVE ng/mL (ref <500)
BUPRENORPHINE, URINE: NEGATIVE ng/mL (ref <5)
Benzodiazepines: NEGATIVE ng/mL (ref <100)
COCAINE METABOLITE: NEGATIVE ng/mL (ref <150)
Creatinine: 134.2 mg/dL
MARIJUANA METABOLITE: NEGATIVE ng/mL (ref <20)
MDMA: NEGATIVE ng/mL (ref <500)
OXYCODONE: NEGATIVE ng/mL (ref <100)
Opiates: NEGATIVE ng/mL (ref <100)
Oxidant: NEGATIVE ug/mL (ref <200)
pH: 6.92 (ref 4.5–9.0)

## 2017-01-13 ENCOUNTER — Telehealth: Payer: Self-pay

## 2017-01-13 DIAGNOSIS — F419 Anxiety disorder, unspecified: Principal | ICD-10-CM

## 2017-01-13 DIAGNOSIS — F329 Major depressive disorder, single episode, unspecified: Secondary | ICD-10-CM

## 2017-01-13 DIAGNOSIS — F32A Depression, unspecified: Secondary | ICD-10-CM

## 2017-01-13 MED ORDER — ESCITALOPRAM OXALATE 5 MG PO TABS: 5.0000 mg | ORAL_TABLET | Freq: Every day | ORAL | 1 refills | Status: DC

## 2017-01-13 NOTE — Telephone Encounter (Signed)
Pt said the sertraline interfered with her other medications and caused pt to be incoherent; pt was afraid to drive while taking sertraline. Pt has stopped taking sertraline and request different med to [facility] [address]. Pt request cb. Last seen 12/19/16.

## 2017-01-13 NOTE — Telephone Encounter (Signed)
Noted. Prescription for Lexapro 5 mg sent to pharmacy to replace Zoloft. Have her stop Zoloft and switch with Lexapro. Please have her schedule a follow-up visit in 4 weeks for reevaluation, have her call us sooner if she has any problems.

## 2017-01-13 NOTE — Telephone Encounter (Signed)
Spoken and notified patient of Kate's comments. Patient verbalized understanding.  FYI......Marland KitchenHowever, patient stated that she may have surgery soon and does not want to schedule follow up at this time

## 2017-01-13 NOTE — Telephone Encounter (Signed)
Noted  

## 2017-01-31 ENCOUNTER — Telehealth: Payer: Self-pay | Admitting: Primary Care

## 2017-01-31 DIAGNOSIS — M1711 Unilateral primary osteoarthritis, right knee: Secondary | ICD-10-CM | POA: Diagnosis not present

## 2017-01-31 NOTE — Telephone Encounter (Signed)
Place form in Kate's inbox. 

## 2017-01-31 NOTE — Telephone Encounter (Signed)
Patient needs surgery clearance form approved, she has a tentative for surgery  after 02/23/17. She will need it faxed to Dr  Lyla Glassing at [phone]. I will put form in RX tower.

## 2017-02-01 NOTE — Telephone Encounter (Signed)
Pt needs appt for surgical clearance. Will likely need EKG. What is she having surgery for?

## 2017-02-03 NOTE — Telephone Encounter (Signed)
Message left for patient to return my call.  

## 2017-02-06 NOTE — Telephone Encounter (Signed)
Spoken to patient and office visit has been schedule on 02/21/2017.  Form in Kate's inbox

## 2017-02-08 NOTE — Telephone Encounter (Signed)
Noted  

## 2017-02-21 ENCOUNTER — Ambulatory Visit (INDEPENDENT_AMBULATORY_CARE_PROVIDER_SITE_OTHER): Payer: Medicare Other | Admitting: Primary Care

## 2017-02-21 ENCOUNTER — Encounter: Payer: Self-pay | Admitting: Primary Care

## 2017-02-21 VITALS — BP 126/70 | HR 78 | Temp 98.2°F | Ht 65.5 in | Wt 176.8 lb

## 2017-02-21 DIAGNOSIS — Z01818 Encounter for other preprocedural examination: Secondary | ICD-10-CM | POA: Diagnosis not present

## 2017-02-21 NOTE — Patient Instructions (Signed)
You are cleared for surgery.  It was a pleasure to see you today! Happy Thanksgiving!

## 2017-02-21 NOTE — Progress Notes (Signed)
Subjective:    Patient ID: VON INSCOE, female    DOB: [birth year], 71 y.o.   MRN: [id number]  HPI  Ms. Rudden is a 71 year old female who presents today for surgical clearance.   She will be undergoing a total right knee replacement soon.   She denies chest pain, shortness of breath, dizziness, weakness.   BP Readings from Last 3 Encounters:  02/21/17 126/70  12/19/16 134/78  12/14/16 120/80     Review of Systems  Eyes: Negative for visual disturbance.  Respiratory: Negative for shortness of breath.   Cardiovascular: Negative for chest pain.  Musculoskeletal: Positive for arthralgias.  Neurological: Negative for dizziness.       Past Medical History:  Diagnosis Date  . Anxiety   . Arthritis    "all over" (04/06/2012)  . Chronic lower back pain   . Chronic prescription benzodiazepine use 09/03/2015  . Depression   . UDJSHFWY(637.8)    "maybe once/week" (04/06/2012) - none recently  . Hx of smoking 10/22/2014  . Hypertension   . Kidney stones   . Pneumonia ~ 1995; 1996  . Renal insufficiency 09/03/2015     Social History   Socioeconomic History  . Marital status: Divorced    Spouse name: Not on file  . Number of children: Not on file  . Years of education: Not on file  . Highest education level: Not on file  Social Needs  . Financial resource strain: Not on file  . Food insecurity - worry: Not on file  . Food insecurity - inability: Not on file  . Transportation needs - medical: Not on file  . Transportation needs - non-medical: Not on file  Occupational History  . Not on file  Tobacco Use  . Smoking status: Former Smoker    Packs/day: 0.50    Years: 33.00    Pack years: 16.50    Types: Cigarettes    Last attempt to quit: 05/02/2014    Years since quitting: 2.8  . Smokeless tobacco: Never Used  Substance and Sexual Activity  . Alcohol use: No    Alcohol/week: 0.0 oz    Comment: 04/06/2012 "tried alcohol once; didn't like it; never tried it again"  . Drug  use: No  . Sexual activity: No  Other Topics Concern  . Not on file  Social History Narrative  . Not on file    Past Surgical History:  Procedure Laterality Date  . ANTERIOR HIP REVISION Right 01/26/2015   Procedure: REVISION RIGHT TOTAL HIP ARTHROPLASTY ACETABULAR COMPONENTS  ANTERIOR APPROACH;  Surgeon: Rod Can, MD;  Location: [facility];  Service: Orthopedics;  Laterality: Right;  . CARPAL TUNNEL RELEASE  1990's   "right" (04/06/2012)  . CHOLECYSTECTOMY  1990's  . JOINT REPLACEMENT     BIL  HIP  REPLACEMENTS   . KNEE ARTHROSCOPY  1990's   "right" (04/06/2012)  . 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 POSTERIOR LUMBAR FUSION  04/06/2012  . TOTAL HIP ARTHROPLASTY  2005; 2007   "right; left" (04/06/2012)  . VAGINAL HYSTERECTOMY  1979    Family History  Problem Relation Age of Onset  . Heart attack Mother   . Alzheimer's disease Mother   . Hypertension Daughter     No Known Allergies  Current  Outpatient Medications on File Prior to Visit  Medication Sig Dispense Refill  . aspirin EC 81 MG tablet Take 1 tablet (81 mg total) by mouth daily.    Marland Kitchen atenolol (TENORMIN) 50 MG tablet Take 1 tablet (50 mg total) by mouth daily. 90 tablet 3  . escitalopram (LEXAPRO) 5 MG tablet Take 1 tablet (5 mg total) by mouth daily. 30 tablet 1  . gabapentin (NEURONTIN) 300 MG capsule Take 2 capsules (600 mg total) by mouth at bedtime. 180 capsule 3  . nortriptyline (PAMELOR) 75 MG capsule TAKE ONE CAPSULE BY MOUTH ONCE DAILY AT BEDTIME 90 capsule 3  . pravastatin (PRAVACHOL) 20 MG tablet Take 1 tablet (20 mg total) by mouth at bedtime. 90 tablet 2  . traMADol-acetaminophen (ULTRACET) 37.5-325 MG tablet Take 1 tablet by mouth twice daily as needed for breakthrough pain. 180  tablet 0  . triamterene-hydrochlorothiazide (MAXZIDE) 75-50 MG tablet Take 0.5 tablets by mouth daily. (Patient taking differently: Take 0.5 tablets by mouth as needed. ) 90 tablet 1   No current facility-administered medications on file prior to visit.     BP 126/70   Pulse 78   Temp 98.2 F (36.8 C) (Oral)   Ht 5' 5.5" (1.664 m)   Wt 176 lb 12.8 oz (80.2 kg)   SpO2 97%   BMI 28.97 kg/m    Objective:   Physical Exam  Constitutional: She is oriented to person, place, and time. She appears well-nourished.  Neck: Neck supple.  Cardiovascular: Normal rate and regular rhythm.  Pulmonary/Chest: Effort normal and breath sounds normal.  Abdominal: Soft. Bowel sounds are normal. There is no tenderness.  Neurological: She is alert and oriented to person, place, and time.  Skin: Skin is warm and dry.          Assessment & Plan:   Pre-Surgical Clearance:  Will be undergoing total right knee replacement in early 2019. Exam today stable.  BP well controlled on current regimen. Labs up to date. ECG: NSR, rate of 71, no ST elevation or depression, no T-wave inversion.  Cleared for surgery. Forms will be faxed.  Sheral Flow, NP

## 2017-02-28 ENCOUNTER — Ambulatory Visit: Payer: Self-pay | Admitting: Orthopedic Surgery

## 2017-03-13 NOTE — Pre-Procedure Instructions (Signed)
ARNETRA TERRIS  03/13/2017      [facility] [address]), [place] - [address] [place] ([place]) [place] [address] Phone: [phone] Fax: [phone]    Your procedure is scheduled on Monday, March 20, 2017  Report to [facility] Admitting Entrance "A" at 9:45AM  Call this number if you have problems the morning of surgery:  [phone]   Remember:  Do not eat food or drink liquids after midnight.  Take these medicines the morning of surgery with A SIP OF WATER: Atenolol (TENORMIN). If needed TraMADol-acetaminophen (ULTRACET) for pain.  Follow your doctor's instruction regarding Aspirin.  As of today, stop taking all Aspirin products, Vitamins, Fish oils, and Herbal medications. Also stop all NSAIDS i.e. Advil, Ibuprofen, Motrin, Aleve, Anaprox, Naproxen, BC and Goody Powders.   Do not wear jewelry, make-up or nail polish.  Do not wear lotions, powders, perfumes, or deodorant.  Do not shave 48 hours prior to surgery.    Do not bring valuables to the hospital.  [facility] is not responsible for any belongings or valuables.  Contacts, dentures or bridgework may not be worn into surgery.  Leave your suitcase in the car.  After surgery it may be brought to your room.  For patients admitted to the hospital, discharge time will be determined by your treatment team.  Patients discharged the day of surgery will not be allowed to drive home.   Special instructions:   [facility]- Preparing For Surgery  Before surgery, you can play an important role. Because skin is not sterile, your skin needs to be as free of germs as possible. You can reduce the number of germs on your skin by washing with CHG (chlorahexidine gluconate) Soap before surgery.  CHG is an antiseptic cleaner which kills germs and bonds with the skin to continue killing germs even after washing.  Please do not use if you have an allergy to CHG or  antibacterial soaps. If your skin becomes reddened/irritated stop using the CHG.  Do not shave (including legs and underarms) for at least 48 hours prior to first CHG shower. It is OK to shave your face.  Please follow these instructions carefully.   1. Shower the NIGHT BEFORE SURGERY and the MORNING OF SURGERY with CHG.   2. If you chose to wash your hair, wash your hair first as usual with your normal shampoo.  3. After you shampoo, rinse your hair and body thoroughly to remove the shampoo.  4. Use CHG as you would any other liquid soap. You can apply CHG directly to the skin and wash gently with a scrungie or a clean washcloth.   5. Apply the CHG Soap to your body ONLY FROM THE NECK DOWN.  Do not use on open wounds or open sores. Avoid contact with your eyes, ears, mouth and genitals (private parts). Wash Face and genitals (private parts)  with your normal soap.  6. Wash thoroughly, paying special attention to the area where your surgery will be performed.  7. Thoroughly rinse your body with warm water from the neck down.  8. DO NOT shower/wash with your normal soap after using and rinsing off the CHG Soap.  9. Pat yourself dry with a CLEAN TOWEL.  10. Wear CLEAN PAJAMAS to bed the night before surgery, wear comfortable clothes the morning of surgery  11. Place CLEAN SHEETS on your bed the night of your first shower and DO NOT  SLEEP WITH PETS.  Day of Surgery: Do not apply any deodorants/lotions. Please wear clean clothes to the hospital/surgery center.    Please read over the following fact sheets that you were given. Pain Booklet, Coughing and Deep Breathing, Total Joint Packet, MRSA Information and Surgical Site Infection Prevention

## 2017-03-14 ENCOUNTER — Encounter (HOSPITAL_COMMUNITY): Payer: Self-pay

## 2017-03-14 ENCOUNTER — Other Ambulatory Visit: Payer: Self-pay

## 2017-03-14 ENCOUNTER — Encounter (HOSPITAL_COMMUNITY)
Admission: RE | Admit: 2017-03-14 | Discharge: 2017-03-14 | Disposition: A | Discharge status: Home / Self Care | Payer: Medicare Other | Source: Ambulatory Visit | Attending: Orthopedic Surgery | Admitting: Orthopedic Surgery

## 2017-03-14 DIAGNOSIS — M1711 Unilateral primary osteoarthritis, right knee: Secondary | ICD-10-CM | POA: Insufficient documentation

## 2017-03-14 DIAGNOSIS — Z01812 Encounter for preprocedural laboratory examination: Secondary | ICD-10-CM | POA: Diagnosis not present

## 2017-03-14 HISTORY — DX: Personal history of urinary calculi: Z87.442

## 2017-03-14 LAB — CBC
HEMATOCRIT: 40.8 % (ref 36.0–46.0)
HEMOGLOBIN: 13.2 g/dL (ref 12.0–15.0)
MCH: 27.6 pg (ref 26.0–34.0)
MCHC: 32.4 g/dL (ref 30.0–36.0)
MCV: 85.2 fL (ref 78.0–100.0)
Platelets: 323 10*3/uL (ref 150–400)
RBC: 4.79 MIL/uL (ref 3.87–5.11)
RDW: 14.2 % (ref 11.5–15.5)
WBC: 9 10*3/uL (ref 4.0–10.5)

## 2017-03-14 LAB — SURGICAL PCR SCREEN
MRSA, PCR: NEGATIVE
STAPHYLOCOCCUS AUREUS: NEGATIVE

## 2017-03-14 LAB — BASIC METABOLIC PANEL
Anion gap: 8 (ref 5–15)
BUN: 7 mg/dL (ref 6–20)
CHLORIDE: 103 mmol/L (ref 101–111)
CO2: 28 mmol/L (ref 22–32)
CREATININE: 0.94 mg/dL (ref 0.44–1.00)
Calcium: 9.1 mg/dL (ref 8.9–10.3)
GFR calc Af Amer: >60 mL/min (ref >60)
GFR calc non Af Amer: 59 mL/min — ABNORMAL LOW (ref >60)
GLUCOSE: 90 mg/dL (ref 65–99)
Potassium: 3.3 mmol/L — ABNORMAL LOW (ref 3.5–5.1)
Sodium: 139 mmol/L (ref 135–145)

## 2017-03-14 LAB — TYPE AND SCREEN
ABO/RH(D): A POS
ANTIBODY SCREEN: NEGATIVE

## 2017-03-17 MED ORDER — TRANEXAMIC ACID 1000 MG/10ML IV SOLN
1000.0000 mg | INTRAVENOUS | Status: AC
  Administered 2017-03-20: 1000 mg via INTRAVENOUS
  Filled 2017-03-17: qty 1100
  Filled 2017-03-17: qty 10

## 2017-03-19 ENCOUNTER — Ambulatory Visit: Payer: Self-pay | Admitting: Orthopedic Surgery

## 2017-03-19 NOTE — H&P (View-Only) (Signed)
TOTAL KNEE ADMISSION H&P  Patient is being admitted for right total knee arthroplasty.  Subjective:  Chief Complaint:right knee pain.  HPI: Felicia Little, 71 y.o. female, has a history of pain and functional disability in the right knee due to arthritis and has failed non-surgical conservative treatments for greater than 12 weeks to includeNSAID's and/or analgesics, corticosteriod injections, flexibility and strengthening excercises, supervised PT with diminished ADL's post treatment, use of assistive devices, weight reduction as appropriate and activity modification.  Onset of symptoms was gradual, starting >10 years ago with gradually worsening course since that time. The patient noted no past surgery on the right knee(s).  Patient currently rates pain in the right knee(s) at 10 out of 10 with activity. Patient has night pain, worsening of pain with activity and weight bearing, pain that interferes with activities of daily living, pain with passive range of motion, crepitus and joint swelling.  Patient has evidence of subchondral cysts, subchondral sclerosis, periarticular osteophytes and joint space narrowing by imaging studies. There is no active infection.  Patient Active Problem List   Diagnosis Date Noted  . Renal insufficiency 09/03/2015  . Controlled substance agreement signed 09/03/2015  . Medication monitoring encounter 09/03/2015  . Chronic pain 04/23/2015  . Instability of prosthetic hip ([place]) 01/26/2015  . Hyperlipidemia 10/22/2014  . Primary osteoarthritis involving multiple joints 10/22/2014  . Hx of smoking 10/22/2014  . Anxiety and depression 10/21/2014  . Essential hypertension 10/21/2014  . Tendinitis of right shoulder 08/21/2013  . Other shoulder lesions, right shoulder 08/21/2013   Past Medical History:  Diagnosis Date  . Anxiety   . Arthritis    "all over" (04/06/2012)  . Chronic lower back pain   . Chronic prescription benzodiazepine use 09/03/2015  . Depression    . OXBDZHGD(924.2)    "maybe once/week" (04/06/2012) - none recently  . History of kidney stones   . Hx of smoking 10/22/2014  . Hypertension   . Kidney stones   . Pneumonia ~ 1995; 1996  . Renal insufficiency 09/03/2015    Past Surgical History:  Procedure Laterality Date  . ANTERIOR HIP REVISION Right 01/26/2015   Procedure: REVISION RIGHT TOTAL HIP ARTHROPLASTY ACETABULAR COMPONENTS  ANTERIOR APPROACH;  Surgeon: Rod Can, MD;  Location: [facility];  Service: Orthopedics;  Laterality: Right;  . CARPAL TUNNEL RELEASE  1990's   "right" (04/06/2012)  . CHOLECYSTECTOMY  1990's  . JOINT REPLACEMENT     RT HIP  REPLACEMENT  . KNEE ARTHROSCOPY  1990's   "right" (04/06/2012)  . LUMBAR WOUND DEBRIDEMENT  04/11/2012   Procedure: LUMBAR WOUND DEBRIDEMENT;  Surgeon: Eustace Moore, MD;  Location: [place] [facility];  Service: Neurosurgery;  Laterality: N/A;  Irrigation and debridement of lumbar wound, Repair of pseudomeningocele not requiring laminectomy  . POSTERIOR LUMBAR FUSION  04/06/2012  . TOTAL HIP ARTHROPLASTY  2005; 2007   "right; left" (04/06/2012)  . VAGINAL HYSTERECTOMY  1979    Current Outpatient Medications  Medication Sig Dispense Refill Last Dose  . aspirin EC 81 MG tablet Take 1 tablet (81 mg total) by mouth daily.   Taking  . atenolol (TENORMIN) 50 MG tablet Take 1 tablet (50 mg total) by mouth daily. 90 tablet 3 Taking  . gabapentin (NEURONTIN) 300 MG capsule Take 2 capsules (600 mg total) by mouth at bedtime. 180 capsule 3 Taking  . nortriptyline (PAMELOR) 75 MG capsule TAKE ONE CAPSULE BY MOUTH ONCE DAILY AT BEDTIME 90 capsule 3 Taking  . pravastatin (PRAVACHOL) 20 MG  tablet Take 1 tablet (20 mg total) by mouth at bedtime. 90 tablet 2 Taking  . traMADol-acetaminophen (ULTRACET) 37.5-325 MG tablet Take 1 tablet by mouth twice daily as needed for breakthrough pain. 180 tablet 0 Taking  . triamterene-hydrochlorothiazide (MAXZIDE) 75-50 MG tablet Take 0.5 tablets by mouth daily. (Patient taking  differently: Take 0.5 tablets by mouth as needed. ) 90 tablet 1 Taking   No current facility-administered medications for this visit.    Facility-Administered Medications Ordered in Other Visits  Medication Dose Route Frequency Provider Last Rate Last Dose  . [START ON 03/20/2017] tranexamic acid (CYKLOKAPRON) 1,000 mg in sodium chloride 0.9 % 100 mL IVPB  1,000 mg Intravenous To OR Kaipo Ardis, Aaron Edelman, MD       Allergies  Allergen Reactions  . Other Swelling    VARIOUS METALS cause internal swelling    Social History   Tobacco Use  . Smoking status: Former Smoker    Packs/day: 0.50    Years: 33.00    Pack years: 16.50    Types: Cigarettes    Last attempt to quit: 05/02/2014    Years since quitting: 2.8  . Smokeless tobacco: Never Used  Substance Use Topics  . Alcohol use: No    Alcohol/week: 0.0 oz    Comment: 04/06/2012 "tried alcohol once; didn't like it; never tried it again"    Family History  Problem Relation Age of Onset  . Heart attack Mother   . Alzheimer's disease Mother   . Hypertension Daughter      Review of Systems  Constitutional: Negative.   HENT: Negative.   Eyes: Negative.   Respiratory: Negative.   Cardiovascular: Negative.   Gastrointestinal: Negative.   Genitourinary: Negative.   Musculoskeletal: Positive for back pain, joint pain and myalgias.  Skin: Negative.   Neurological: Negative.   Endo/Heme/Allergies: Negative.   Psychiatric/Behavioral: Negative.     Objective:  Physical Exam  Vitals reviewed. Constitutional: She is oriented to person, place, and time. She appears well-developed and well-nourished.  HENT:  Head: Normocephalic and atraumatic.  Eyes: Conjunctivae and EOM are normal. Pupils are equal, round, and reactive to light.  Neck: Normal range of motion. Neck supple.  Cardiovascular: Normal rate, regular rhythm and intact distal pulses.  Respiratory: Effort normal. No respiratory distress.  GI: Soft. She exhibits no distension.   Genitourinary:  Genitourinary Comments: deferred  Musculoskeletal:       Right knee: She exhibits decreased range of motion, swelling, effusion, deformity, abnormal alignment and bony tenderness.  Neurological: She is alert and oriented to person, place, and time. She has normal reflexes.  Skin: Skin is warm and dry.  Psychiatric: She has a normal mood and affect. Her behavior is normal. Judgment and thought content normal.    Vital signs in last 24 hours: @VSRANGES @  Labs:   Estimated body mass index is 29.2 kg/m as calculated from the following:   Height as of 03/14/17: 5\' 5"  (1.651 m).   Weight as of 03/14/17: 79.6 kg (175 lb 8 oz).   Imaging Review Plain radiographs demonstrate severe degenerative joint disease of the right knee(s). The overall alignment issignificant valgus. The bone quality appears to be adequate for age and reported activity level.  Assessment/Plan:  End stage arthritis, right knee   The patient history, physical examination, clinical judgment of the provider and imaging studies are consistent with end stage degenerative joint disease of the right knee(s) and total knee arthroplasty is deemed medically necessary. The treatment options including medical management,  injection therapy arthroscopy and arthroplasty were discussed at length. The risks and benefits of total knee arthroplasty were presented and reviewed. The risks due to aseptic loosening, infection, stiffness, patella tracking problems, thromboembolic complications and other imponderables were discussed. The patient acknowledged the explanation, agreed to proceed with the plan and consent was signed. Patient is being admitted for inpatient treatment for surgery, pain control, PT, OT, prophylactic antibiotics, VTE prophylaxis, progressive ambulation and ADL's and discharge planning. The patient is planning to be discharged home with outpatient PT. Has DME.

## 2017-03-19 NOTE — H&P (Signed)
TOTAL KNEE ADMISSION H&P  Patient is being admitted for right total knee arthroplasty.  Subjective:  Chief Complaint:right knee pain.  HPI: Felicia Little, 71 y.o. female, has a history of pain and functional disability in the right knee due to arthritis and has failed non-surgical conservative treatments for greater than 12 weeks to includeNSAID's and/or analgesics, corticosteriod injections, flexibility and strengthening excercises, supervised PT with diminished ADL's post treatment, use of assistive devices, weight reduction as appropriate and activity modification.  Onset of symptoms was gradual, starting >10 years ago with gradually worsening course since that time. The patient noted no past surgery on the right knee(s).  Patient currently rates pain in the right knee(s) at 10 out of 10 with activity. Patient has night pain, worsening of pain with activity and weight bearing, pain that interferes with activities of daily living, pain with passive range of motion, crepitus and joint swelling.  Patient has evidence of subchondral cysts, subchondral sclerosis, periarticular osteophytes and joint space narrowing by imaging studies. There is no active infection.  Patient Active Problem List   Diagnosis Date Noted  . Renal insufficiency 09/03/2015  . Controlled substance agreement signed 09/03/2015  . Medication monitoring encounter 09/03/2015  . Chronic pain 04/23/2015  . Instability of prosthetic hip ([place]) 01/26/2015  . Hyperlipidemia 10/22/2014  . Primary osteoarthritis involving multiple joints 10/22/2014  . Hx of smoking 10/22/2014  . Anxiety and depression 10/21/2014  . Essential hypertension 10/21/2014  . Tendinitis of right shoulder 08/21/2013  . Other shoulder lesions, right shoulder 08/21/2013   Past Medical History:  Diagnosis Date  . Anxiety   . Arthritis    "all over" (04/06/2012)  . Chronic lower back pain   . Chronic prescription benzodiazepine use 09/03/2015  . Depression    . ASTMHDQQ(229.7)    "maybe once/week" (04/06/2012) - none recently  . History of kidney stones   . Hx of smoking 10/22/2014  . Hypertension   . Kidney stones   . Pneumonia ~ 1995; 1996  . Renal insufficiency 09/03/2015    Past Surgical History:  Procedure Laterality Date  . ANTERIOR HIP REVISION Right 01/26/2015   Procedure: REVISION RIGHT TOTAL HIP ARTHROPLASTY ACETABULAR COMPONENTS  ANTERIOR APPROACH;  Surgeon: Rod Can, MD;  Location: [place];  Service: Orthopedics;  Laterality: Right;  . CARPAL TUNNEL RELEASE  1990's   "right" (04/06/2012)  . CHOLECYSTECTOMY  1990's  . JOINT REPLACEMENT     RT HIP  REPLACEMENT  . KNEE ARTHROSCOPY  1990's   "right" (04/06/2012)  . LUMBAR WOUND DEBRIDEMENT  04/11/2012   Procedure: LUMBAR WOUND DEBRIDEMENT;  Surgeon: Eustace Moore, MD;  Location: [place] [facility];  Service: Neurosurgery;  Laterality: N/A;  Irrigation and debridement of lumbar wound, Repair of pseudomeningocele not requiring laminectomy  . POSTERIOR LUMBAR FUSION  04/06/2012  . TOTAL HIP ARTHROPLASTY  2005; 2007   "right; left" (04/06/2012)  . VAGINAL HYSTERECTOMY  1979    Current Outpatient Medications  Medication Sig Dispense Refill Last Dose  . aspirin EC 81 MG tablet Take 1 tablet (81 mg total) by mouth daily.   Taking  . atenolol (TENORMIN) 50 MG tablet Take 1 tablet (50 mg total) by mouth daily. 90 tablet 3 Taking  . gabapentin (NEURONTIN) 300 MG capsule Take 2 capsules (600 mg total) by mouth at bedtime. 180 capsule 3 Taking  . nortriptyline (PAMELOR) 75 MG capsule TAKE ONE CAPSULE BY MOUTH ONCE DAILY AT BEDTIME 90 capsule 3 Taking  . pravastatin (PRAVACHOL) 20 MG  tablet Take 1 tablet (20 mg total) by mouth at bedtime. 90 tablet 2 Taking  . traMADol-acetaminophen (ULTRACET) 37.5-325 MG tablet Take 1 tablet by mouth twice daily as needed for breakthrough pain. 180 tablet 0 Taking  . triamterene-hydrochlorothiazide (MAXZIDE) 75-50 MG tablet Take 0.5 tablets by mouth daily. (Patient taking  differently: Take 0.5 tablets by mouth as needed. ) 90 tablet 1 Taking   No current facility-administered medications for this visit.    Facility-Administered Medications Ordered in Other Visits  Medication Dose Route Frequency Provider Last Rate Last Dose  . [START ON 03/20/2017] tranexamic acid (CYKLOKAPRON) 1,000 mg in sodium chloride 0.9 % 100 mL IVPB  1,000 mg Intravenous To OR Dnya Hickle, Aaron Edelman, MD       Allergies  Allergen Reactions  . Other Swelling    VARIOUS METALS cause internal swelling    Social History   Tobacco Use  . Smoking status: Former Smoker    Packs/day: 0.50    Years: 33.00    Pack years: 16.50    Types: Cigarettes    Last attempt to quit: 05/02/2014    Years since quitting: 2.8  . Smokeless tobacco: Never Used  Substance Use Topics  . Alcohol use: No    Alcohol/week: 0.0 oz    Comment: 04/06/2012 "tried alcohol once; didn't like it; never tried it again"    Family History  Problem Relation Age of Onset  . Heart attack Mother   . Alzheimer's disease Mother   . Hypertension Daughter      Review of Systems  Constitutional: Negative.   HENT: Negative.   Eyes: Negative.   Respiratory: Negative.   Cardiovascular: Negative.   Gastrointestinal: Negative.   Genitourinary: Negative.   Musculoskeletal: Positive for back pain, joint pain and myalgias.  Skin: Negative.   Neurological: Negative.   Endo/Heme/Allergies: Negative.   Psychiatric/Behavioral: Negative.     Objective:  Physical Exam  Vitals reviewed. Constitutional: She is oriented to person, place, and time. She appears well-developed and well-nourished.  HENT:  Head: Normocephalic and atraumatic.  Eyes: Conjunctivae and EOM are normal. Pupils are equal, round, and reactive to light.  Neck: Normal range of motion. Neck supple.  Cardiovascular: Normal rate, regular rhythm and intact distal pulses.  Respiratory: Effort normal. No respiratory distress.  GI: Soft. She exhibits no distension.   Genitourinary:  Genitourinary Comments: deferred  Musculoskeletal:       Right knee: She exhibits decreased range of motion, swelling, effusion, deformity, abnormal alignment and bony tenderness.  Neurological: She is alert and oriented to person, place, and time. She has normal reflexes.  Skin: Skin is warm and dry.  Psychiatric: She has a normal mood and affect. Her behavior is normal. Judgment and thought content normal.    Vital signs in last 24 hours: @VSRANGES @  Labs:   Estimated body mass index is 29.2 kg/m as calculated from the following:   Height as of 03/14/17: 5\' 5"  (1.651 m).   Weight as of 03/14/17: 79.6 kg (175 lb 8 oz).   Imaging Review Plain radiographs demonstrate severe degenerative joint disease of the right knee(s). The overall alignment issignificant valgus. The bone quality appears to be adequate for age and reported activity level.  Assessment/Plan:  End stage arthritis, right knee   The patient history, physical examination, clinical judgment of the provider and imaging studies are consistent with end stage degenerative joint disease of the right knee(s) and total knee arthroplasty is deemed medically necessary. The treatment options including medical management,  injection therapy arthroscopy and arthroplasty were discussed at length. The risks and benefits of total knee arthroplasty were presented and reviewed. The risks due to aseptic loosening, infection, stiffness, patella tracking problems, thromboembolic complications and other imponderables were discussed. The patient acknowledged the explanation, agreed to proceed with the plan and consent was signed. Patient is being admitted for inpatient treatment for surgery, pain control, PT, OT, prophylactic antibiotics, VTE prophylaxis, progressive ambulation and ADL's and discharge planning. The patient is planning to be discharged home with outpatient PT. Has DME.

## 2017-03-20 ENCOUNTER — Encounter (HOSPITAL_COMMUNITY): Payer: Self-pay | Admitting: *Deleted

## 2017-03-20 ENCOUNTER — Inpatient Hospital Stay (HOSPITAL_COMMUNITY)
Admission: RE | Admit: 2017-03-20 | Discharge: 2017-03-22 | DRG: 470 | Disposition: A | Discharge status: Home / Self Care | Payer: Medicare Other | Source: Ambulatory Visit | Attending: Orthopedic Surgery | Admitting: Orthopedic Surgery

## 2017-03-20 ENCOUNTER — Inpatient Hospital Stay (HOSPITAL_COMMUNITY): Payer: Medicare Other

## 2017-03-20 ENCOUNTER — Other Ambulatory Visit: Payer: Self-pay

## 2017-03-20 ENCOUNTER — Encounter (HOSPITAL_COMMUNITY)
Admission: RE | Disposition: A | Discharge status: Home / Self Care | Payer: Self-pay | Source: Ambulatory Visit | Attending: Orthopedic Surgery

## 2017-03-20 ENCOUNTER — Inpatient Hospital Stay (HOSPITAL_COMMUNITY): Payer: Medicare Other | Admitting: Anesthesiology

## 2017-03-20 DIAGNOSIS — F329 Major depressive disorder, single episode, unspecified: Secondary | ICD-10-CM | POA: Diagnosis present

## 2017-03-20 DIAGNOSIS — Z91048 Other nonmedicinal substance allergy status: Secondary | ICD-10-CM | POA: Diagnosis not present

## 2017-03-20 DIAGNOSIS — Z96651 Presence of right artificial knee joint: Secondary | ICD-10-CM

## 2017-03-20 DIAGNOSIS — E7800 Pure hypercholesterolemia, unspecified: Secondary | ICD-10-CM | POA: Diagnosis present

## 2017-03-20 DIAGNOSIS — E785 Hyperlipidemia, unspecified: Secondary | ICD-10-CM | POA: Diagnosis not present

## 2017-03-20 DIAGNOSIS — I10 Essential (primary) hypertension: Secondary | ICD-10-CM | POA: Diagnosis present

## 2017-03-20 DIAGNOSIS — Z7982 Long term (current) use of aspirin: Secondary | ICD-10-CM | POA: Diagnosis not present

## 2017-03-20 DIAGNOSIS — Z87891 Personal history of nicotine dependence: Secondary | ICD-10-CM

## 2017-03-20 DIAGNOSIS — F419 Anxiety disorder, unspecified: Secondary | ICD-10-CM | POA: Diagnosis present

## 2017-03-20 DIAGNOSIS — Z96641 Presence of right artificial hip joint: Secondary | ICD-10-CM | POA: Diagnosis present

## 2017-03-20 DIAGNOSIS — M1711 Unilateral primary osteoarthritis, right knee: Principal | ICD-10-CM | POA: Diagnosis present

## 2017-03-20 DIAGNOSIS — M25561 Pain in right knee: Secondary | ICD-10-CM | POA: Diagnosis present

## 2017-03-20 DIAGNOSIS — G8918 Other acute postprocedural pain: Secondary | ICD-10-CM | POA: Diagnosis not present

## 2017-03-20 DIAGNOSIS — Z471 Aftercare following joint replacement surgery: Secondary | ICD-10-CM | POA: Diagnosis not present

## 2017-03-20 HISTORY — PX: KNEE ARTHROPLASTY: SHX992

## 2017-03-20 HISTORY — DX: Pure hypercholesterolemia, unspecified: E78.00

## 2017-03-20 HISTORY — DX: Personal history of other medical treatment: Z92.89

## 2017-03-20 SURGERY — ARTHROPLASTY, KNEE, TOTAL, USING IMAGELESS COMPUTER-ASSISTED NAVIGATION
Anesthesia: Spinal | Site: Knee | Laterality: Right

## 2017-03-20 MED ORDER — MENTHOL 3 MG MT LOZG: 1.0000 | LOZENGE | OROMUCOSAL | Status: DC | PRN

## 2017-03-20 MED ORDER — FENTANYL CITRATE (PF) 250 MCG/5ML IJ SOLN
INTRAMUSCULAR | Status: AC
  Filled 2017-03-20: qty 5

## 2017-03-20 MED ORDER — METOCLOPRAMIDE HCL 5 MG PO TABS: 5.0000 mg | ORAL_TABLET | Freq: Three times a day (TID) | ORAL | Status: DC | PRN

## 2017-03-20 MED ORDER — HYDROCODONE-ACETAMINOPHEN 5-325 MG PO TABS
2.0000 | ORAL_TABLET | ORAL | Status: DC | PRN
  Administered 2017-03-20 – 2017-03-22 (×5): 2 via ORAL
  Filled 2017-03-20 (×5): qty 2

## 2017-03-20 MED ORDER — DOCUSATE SODIUM 100 MG PO CAPS
100.0000 mg | ORAL_CAPSULE | Freq: Two times a day (BID) | ORAL | Status: DC
  Administered 2017-03-21 – 2017-03-22 (×3): 100 mg via ORAL
  Filled 2017-03-20 (×4): qty 1

## 2017-03-20 MED ORDER — SODIUM CHLORIDE 0.9 % IR SOLN
Status: DC | PRN
Start: 2017-03-20 — End: 2017-03-20
  Administered 2017-03-20: 3000 mL
  Administered 2017-03-20: 500 mL

## 2017-03-20 MED ORDER — HYDROMORPHONE HCL 1 MG/ML IJ SOLN
0.2500 mg | INTRAMUSCULAR | Status: DC | PRN
  Administered 2017-03-20 (×4): 0.5 mg via INTRAVENOUS

## 2017-03-20 MED ORDER — BUPIVACAINE-EPINEPHRINE (PF) 0.5% -1:200000 IJ SOLN
INTRAMUSCULAR | Status: DC | PRN
  Administered 2017-03-20: 30 mL via PERINEURAL

## 2017-03-20 MED ORDER — MIDAZOLAM HCL 2 MG/2ML IJ SOLN
INTRAMUSCULAR | Status: AC
  Filled 2017-03-20: qty 2

## 2017-03-20 MED ORDER — ALUM & MAG HYDROXIDE-SIMETH 200-200-20 MG/5ML PO SUSP: 30.0000 mL | ORAL | Status: DC | PRN

## 2017-03-20 MED ORDER — KETOROLAC TROMETHAMINE 15 MG/ML IJ SOLN
7.5000 mg | Freq: Four times a day (QID) | INTRAMUSCULAR | Status: AC
  Administered 2017-03-20 – 2017-03-21 (×4): 7.5 mg via INTRAVENOUS
  Filled 2017-03-20 (×5): qty 1

## 2017-03-20 MED ORDER — HYDROMORPHONE HCL 1 MG/ML IJ SOLN
INTRAMUSCULAR | Status: AC
  Administered 2017-03-20: 0.5 mg via INTRAVENOUS
  Filled 2017-03-20: qty 1

## 2017-03-20 MED ORDER — KETOROLAC TROMETHAMINE 30 MG/ML IJ SOLN
INTRAMUSCULAR | Status: DC | PRN
Start: 2017-03-20 — End: 2017-03-20
  Administered 2017-03-20: 30 mg via INTRAVENOUS

## 2017-03-20 MED ORDER — ROCURONIUM BROMIDE 100 MG/10ML IV SOLN
INTRAVENOUS | Status: DC | PRN
  Administered 2017-03-20: 20 mg via INTRAVENOUS

## 2017-03-20 MED ORDER — ONDANSETRON HCL 4 MG PO TABS: 4.0000 mg | ORAL_TABLET | Freq: Four times a day (QID) | ORAL | Status: DC | PRN

## 2017-03-20 MED ORDER — HYDROMORPHONE HCL 1 MG/ML IJ SOLN
0.5000 mg | INTRAMUSCULAR | Status: DC | PRN
  Administered 2017-03-21: 1 mg via INTRAVENOUS
  Administered 2017-03-21: 2 mg via INTRAVENOUS
  Administered 2017-03-21 (×2): 1 mg via INTRAVENOUS
  Filled 2017-03-20: qty 2
  Filled 2017-03-20 (×3): qty 1

## 2017-03-20 MED ORDER — CEFAZOLIN SODIUM-DEXTROSE 2-4 GM/100ML-% IV SOLN
2.0000 g | INTRAVENOUS | Status: AC
  Administered 2017-03-20: 2 g via INTRAVENOUS

## 2017-03-20 MED ORDER — FENTANYL CITRATE (PF) 100 MCG/2ML IJ SOLN
INTRAMUSCULAR | Status: AC
  Filled 2017-03-20: qty 2

## 2017-03-20 MED ORDER — ONDANSETRON HCL 4 MG/2ML IJ SOLN
INTRAMUSCULAR | Status: DC | PRN
  Administered 2017-03-20: 4 mg via INTRAVENOUS

## 2017-03-20 MED ORDER — ATENOLOL 50 MG PO TABS
50.0000 mg | ORAL_TABLET | Freq: Every day | ORAL | Status: DC
  Administered 2017-03-21 – 2017-03-22 (×2): 50 mg via ORAL
  Filled 2017-03-20 (×2): qty 1

## 2017-03-20 MED ORDER — 0.9 % SODIUM CHLORIDE (POUR BTL) OPTIME
TOPICAL | Status: DC | PRN
  Administered 2017-03-20: 1000 mL

## 2017-03-20 MED ORDER — PROPOFOL 10 MG/ML IV BOLUS
INTRAVENOUS | Status: AC
  Filled 2017-03-20: qty 20

## 2017-03-20 MED ORDER — POVIDONE-IODINE 10 % EX SWAB: 2.0000 "application " | Freq: Once | CUTANEOUS | Status: DC

## 2017-03-20 MED ORDER — HYDROCODONE-ACETAMINOPHEN 5-325 MG PO TABS
1.0000 | ORAL_TABLET | ORAL | Status: DC | PRN
  Administered 2017-03-21 (×2): 1 via ORAL
  Filled 2017-03-20 (×2): qty 1

## 2017-03-20 MED ORDER — LACTATED RINGERS IV SOLN
INTRAVENOUS | Status: DC
  Administered 2017-03-20: 10:00:00 via INTRAVENOUS

## 2017-03-20 MED ORDER — FENTANYL CITRATE (PF) 100 MCG/2ML IJ SOLN
INTRAMUSCULAR | Status: DC | PRN
  Administered 2017-03-20 (×5): 50 ug via INTRAVENOUS

## 2017-03-20 MED ORDER — ROPIVACAINE HCL 7.5 MG/ML IJ SOLN
INTRAMUSCULAR | Status: DC | PRN
  Administered 2017-03-20: 20 mL via PERINEURAL

## 2017-03-20 MED ORDER — POLYETHYLENE GLYCOL 3350 17 G PO PACK: 17.0000 g | PACK | Freq: Every day | ORAL | Status: DC | PRN

## 2017-03-20 MED ORDER — METHOCARBAMOL 500 MG PO TABS
ORAL_TABLET | ORAL | Status: AC
  Filled 2017-03-20: qty 1

## 2017-03-20 MED ORDER — BUPIVACAINE-EPINEPHRINE (PF) 0.5% -1:200000 IJ SOLN
INTRAMUSCULAR | Status: AC
  Filled 2017-03-20: qty 30

## 2017-03-20 MED ORDER — ONDANSETRON HCL 4 MG/2ML IJ SOLN: 4.0000 mg | Freq: Four times a day (QID) | INTRAMUSCULAR | Status: DC | PRN

## 2017-03-20 MED ORDER — SENNA 8.6 MG PO TABS
2.0000 | ORAL_TABLET | Freq: Every day | ORAL | Status: DC
  Administered 2017-03-20 – 2017-03-21 (×2): 17.2 mg via ORAL
  Filled 2017-03-20 (×2): qty 2

## 2017-03-20 MED ORDER — GABAPENTIN 600 MG PO TABS
600.0000 mg | ORAL_TABLET | Freq: Every day | ORAL | Status: DC
  Administered 2017-03-20 – 2017-03-21 (×2): 600 mg via ORAL
  Filled 2017-03-20 (×3): qty 1

## 2017-03-20 MED ORDER — CHLORHEXIDINE GLUCONATE 4 % EX LIQD: 60.0000 mL | Freq: Once | CUTANEOUS | Status: DC

## 2017-03-20 MED ORDER — SUCCINYLCHOLINE CHLORIDE 20 MG/ML IJ SOLN
INTRAMUSCULAR | Status: DC | PRN
  Administered 2017-03-20: 100 mg via INTRAVENOUS

## 2017-03-20 MED ORDER — METOCLOPRAMIDE HCL 5 MG/ML IJ SOLN: 5.0000 mg | Freq: Three times a day (TID) | INTRAMUSCULAR | Status: DC | PRN

## 2017-03-20 MED ORDER — DIPHENHYDRAMINE HCL 12.5 MG/5ML PO ELIX: 12.5000 mg | ORAL_SOLUTION | ORAL | Status: DC | PRN

## 2017-03-20 MED ORDER — LIDOCAINE HCL (CARDIAC) 20 MG/ML IV SOLN
INTRAVENOUS | Status: DC | PRN
  Administered 2017-03-20: 60 mg via INTRAVENOUS

## 2017-03-20 MED ORDER — MIDAZOLAM HCL 2 MG/2ML IJ SOLN
1.5000 mg | Freq: Once | INTRAMUSCULAR | Status: AC
  Administered 2017-03-20: 1.5 mg via INTRAVENOUS

## 2017-03-20 MED ORDER — PHENOL 1.4 % MT LIQD: 1.0000 | OROMUCOSAL | Status: DC | PRN

## 2017-03-20 MED ORDER — DEXAMETHASONE SODIUM PHOSPHATE 10 MG/ML IJ SOLN
INTRAMUSCULAR | Status: DC | PRN
  Administered 2017-03-20: 10 mg via INTRAVENOUS

## 2017-03-20 MED ORDER — LIDOCAINE 2% (20 MG/ML) 5 ML SYRINGE
INTRAMUSCULAR | Status: AC
  Filled 2017-03-20: qty 5

## 2017-03-20 MED ORDER — SODIUM CHLORIDE 0.9 % IV SOLN
INTRAVENOUS | Status: DC
  Administered 2017-03-20 – 2017-03-21 (×2): via INTRAVENOUS

## 2017-03-20 MED ORDER — OXYCODONE HCL 5 MG/5ML PO SOLN: 5.0000 mg | Freq: Once | ORAL | Status: DC | PRN

## 2017-03-20 MED ORDER — ACETAMINOPHEN 325 MG PO TABS: 650.0000 mg | ORAL_TABLET | ORAL | Status: DC | PRN

## 2017-03-20 MED ORDER — CEFAZOLIN SODIUM-DEXTROSE 2-4 GM/100ML-% IV SOLN
2.0000 g | Freq: Four times a day (QID) | INTRAVENOUS | Status: AC
  Administered 2017-03-20 – 2017-03-21 (×2): 2 g via INTRAVENOUS
  Filled 2017-03-20 (×3): qty 100

## 2017-03-20 MED ORDER — SODIUM CHLORIDE 0.9 % IV SOLN: INTRAVENOUS | Status: DC

## 2017-03-20 MED ORDER — FENTANYL CITRATE (PF) 100 MCG/2ML IJ SOLN
75.0000 ug | Freq: Once | INTRAMUSCULAR | Status: AC
  Administered 2017-03-20: 75 ug via INTRAVENOUS

## 2017-03-20 MED ORDER — DEXAMETHASONE SODIUM PHOSPHATE 10 MG/ML IJ SOLN
INTRAMUSCULAR | Status: AC
  Filled 2017-03-20: qty 1

## 2017-03-20 MED ORDER — HYDROCODONE-ACETAMINOPHEN 5-325 MG PO TABS
ORAL_TABLET | ORAL | Status: AC
Start: 2017-03-20 — End: 2017-03-21
  Filled 2017-03-20: qty 2

## 2017-03-20 MED ORDER — ROCURONIUM BROMIDE 10 MG/ML (PF) SYRINGE
PREFILLED_SYRINGE | INTRAVENOUS | Status: AC
  Filled 2017-03-20: qty 5

## 2017-03-20 MED ORDER — KETOROLAC TROMETHAMINE 30 MG/ML IJ SOLN
INTRAMUSCULAR | Status: AC
  Filled 2017-03-20: qty 1

## 2017-03-20 MED ORDER — ONDANSETRON HCL 4 MG/2ML IJ SOLN
INTRAMUSCULAR | Status: AC
  Filled 2017-03-20: qty 2

## 2017-03-20 MED ORDER — SUGAMMADEX SODIUM 200 MG/2ML IV SOLN
INTRAVENOUS | Status: AC
  Filled 2017-03-20: qty 2

## 2017-03-20 MED ORDER — DEXAMETHASONE SODIUM PHOSPHATE 10 MG/ML IJ SOLN
10.0000 mg | Freq: Once | INTRAMUSCULAR | Status: AC
  Administered 2017-03-21: 10 mg via INTRAVENOUS
  Filled 2017-03-20: qty 1

## 2017-03-20 MED ORDER — LACTATED RINGERS IV SOLN
INTRAVENOUS | Status: DC
  Administered 2017-03-20 (×2): via INTRAVENOUS

## 2017-03-20 MED ORDER — SUCCINYLCHOLINE CHLORIDE 200 MG/10ML IV SOSY
PREFILLED_SYRINGE | INTRAVENOUS | Status: AC
  Filled 2017-03-20: qty 10

## 2017-03-20 MED ORDER — SUGAMMADEX SODIUM 200 MG/2ML IV SOLN
INTRAVENOUS | Status: DC | PRN
  Administered 2017-03-20: 150 mg via INTRAVENOUS

## 2017-03-20 MED ORDER — METHOCARBAMOL 500 MG PO TABS
500.0000 mg | ORAL_TABLET | Freq: Four times a day (QID) | ORAL | Status: DC | PRN
  Administered 2017-03-20: 500 mg via ORAL

## 2017-03-20 MED ORDER — NORTRIPTYLINE HCL 25 MG PO CAPS
75.0000 mg | ORAL_CAPSULE | Freq: Every day | ORAL | Status: DC
  Administered 2017-03-20 – 2017-03-21 (×2): 75 mg via ORAL
  Filled 2017-03-20 (×2): qty 3

## 2017-03-20 MED ORDER — SODIUM CHLORIDE 0.9 % IJ SOLN
INTRAMUSCULAR | Status: DC | PRN
Start: 2017-03-20 — End: 2017-03-20
  Administered 2017-03-20: 30 mL via INTRAVENOUS

## 2017-03-20 MED ORDER — TRANEXAMIC ACID 1000 MG/10ML IV SOLN
1000.0000 mg | Freq: Once | INTRAVENOUS | Status: AC
  Administered 2017-03-20: 1000 mg via INTRAVENOUS
  Filled 2017-03-20: qty 10

## 2017-03-20 MED ORDER — ACETAMINOPHEN 10 MG/ML IV SOLN
1000.0000 mg | INTRAVENOUS | Status: AC
  Administered 2017-03-20: 1000 mg via INTRAVENOUS
  Filled 2017-03-20: qty 100

## 2017-03-20 MED ORDER — OXYCODONE HCL 5 MG PO TABS: 5.0000 mg | ORAL_TABLET | Freq: Once | ORAL | Status: DC | PRN

## 2017-03-20 MED ORDER — METHOCARBAMOL 1000 MG/10ML IJ SOLN
500.0000 mg | Freq: Four times a day (QID) | INTRAVENOUS | Status: DC | PRN
  Filled 2017-03-20: qty 5

## 2017-03-20 MED ORDER — PRAVASTATIN SODIUM 10 MG PO TABS
20.0000 mg | ORAL_TABLET | Freq: Every day | ORAL | Status: DC
  Administered 2017-03-20 – 2017-03-21 (×2): 20 mg via ORAL
  Filled 2017-03-20 (×2): qty 2

## 2017-03-20 MED ORDER — ACETAMINOPHEN 650 MG RE SUPP: 650.0000 mg | RECTAL | Status: DC | PRN

## 2017-03-20 MED ORDER — PROPOFOL 10 MG/ML IV BOLUS
INTRAVENOUS | Status: DC | PRN
  Administered 2017-03-20: 40 mg via INTRAVENOUS
  Administered 2017-03-20: 30 mg via INTRAVENOUS
  Administered 2017-03-20: 160 mg via INTRAVENOUS

## 2017-03-20 MED ORDER — CEFAZOLIN SODIUM-DEXTROSE 2-4 GM/100ML-% IV SOLN
INTRAVENOUS | Status: AC
  Filled 2017-03-20: qty 100

## 2017-03-20 MED ORDER — ASPIRIN 81 MG PO CHEW
81.0000 mg | CHEWABLE_TABLET | Freq: Two times a day (BID) | ORAL | Status: DC
  Administered 2017-03-20 – 2017-03-22 (×4): 81 mg via ORAL
  Filled 2017-03-20 (×4): qty 1

## 2017-03-20 SURGICAL SUPPLY — 41 items
ALCOHOL ISOPROPYL (RUBBING) (MISCELLANEOUS) ×2 IMPLANT
BLADE SAW RECIP 87.9 MT (BLADE) ×2 IMPLANT
BNDG ELASTIC 6X10 VLCR STRL LF (GAUZE/BANDAGES/DRESSINGS) ×2 IMPLANT
CAPT KNEE TRIATH TK-4 ×2 IMPLANT
CHLORAPREP W/TINT 26ML (MISCELLANEOUS) ×4 IMPLANT
CUFF TOURNIQUET SINGLE 34IN LL (TOURNIQUET CUFF) ×2 IMPLANT
DERMABOND ADVANCED (GAUZE/BANDAGES/DRESSINGS) ×1
DERMABOND ADVANCED .7 DNX12 (GAUZE/BANDAGES/DRESSINGS) ×1 IMPLANT
DRAIN HEMOVAC 7FR (DRAIN) IMPLANT
DRAPE EXTREMITY T 121X128X90 (DRAPE) ×2 IMPLANT
DRAPE U-SHAPE 47X51 STRL (DRAPES) ×2 IMPLANT
DRAPE UNIVERSAL PACK (DRAPES) ×2 IMPLANT
DRSG AQUACEL AG ADV 3.5X14 (GAUZE/BANDAGES/DRESSINGS) ×2 IMPLANT
ELECT REM PT RETURN 9FT ADLT (ELECTROSURGICAL) ×2
ELECTRODE REM PT RTRN 9FT ADLT (ELECTROSURGICAL) ×1 IMPLANT
EVACUATOR 1/8 PVC DRAIN (DRAIN) IMPLANT
GLOVE BIO SURGEON STRL SZ8.5 (GLOVE) ×4 IMPLANT
GLOVE BIOGEL PI IND STRL 8.5 (GLOVE) ×1 IMPLANT
GLOVE BIOGEL PI INDICATOR 8.5 (GLOVE) ×1
GOWN STRL REUS W/TWL 2XL LVL3 (GOWN DISPOSABLE) ×4 IMPLANT
HANDPIECE INTERPULSE COAX TIP (DISPOSABLE) ×1
HOOD PEEL AWAY FLYTE STAYCOOL (MISCELLANEOUS) ×4 IMPLANT
KIT BASIN OR (CUSTOM PROCEDURE TRAY) ×2 IMPLANT
MANIFOLD NEPTUNE II (INSTRUMENTS) ×2 IMPLANT
NEEDLE SPNL 18GX3.5 QUINCKE PK (NEEDLE) ×2 IMPLANT
PACK TOTAL JOINT (CUSTOM PROCEDURE TRAY) ×2 IMPLANT
PACK TOTAL KNEE CUSTOM (KITS) ×2 IMPLANT
SAW OSC TIP CART 19.5X105X1.3 (SAW) ×2 IMPLANT
SEALER BIPOLAR AQUA 6.0 (INSTRUMENTS) ×2 IMPLANT
SET HNDPC FAN SPRY TIP SCT (DISPOSABLE) ×1 IMPLANT
SET PAD KNEE POSITIONER (MISCELLANEOUS) ×2 IMPLANT
SUT MNCRL AB 3-0 PS2 27 (SUTURE) ×2 IMPLANT
SUT MON AB 2-0 CT1 36 (SUTURE) ×4 IMPLANT
SUT VIC AB 1 CTX 27 (SUTURE) ×4 IMPLANT
SUT VIC AB 2-0 CT1 27 (SUTURE)
SUT VIC AB 2-0 CT1 TAPERPNT 27 (SUTURE) IMPLANT
SUT VLOC 180 0 24IN GS25 (SUTURE) ×2 IMPLANT
SYR 50ML LL SCALE MARK (SYRINGE) ×4 IMPLANT
TOWER CARTRIDGE SMART MIX (DISPOSABLE) IMPLANT
TRAY CATH 16FR W/PLASTIC CATH (SET/KITS/TRAYS/PACK) IMPLANT
WRAP KNEE MAXI GEL POST OP (GAUZE/BANDAGES/DRESSINGS) ×2 IMPLANT

## 2017-03-20 NOTE — Anesthesia Preprocedure Evaluation (Addendum)
Anesthesia Evaluation  Patient identified by MRN, date of birth, ID band Patient awake    Reviewed: Allergy & Precautions, NPO status , Patient's Chart, lab work & pertinent test results  History of Anesthesia Complications Negative for: history of anesthetic complications  Airway Mallampati: II  TM Distance: >3 FB Neck ROM: Full    Dental  (+) Edentulous Upper, Dental Advisory Given   Pulmonary neg pulmonary ROS, former smoker,    breath sounds clear to auscultation       Cardiovascular hypertension, Pt. on medications and Pt. on home beta blockers  Rhythm:Regular Rate:Normal     Neuro/Psych  Headaches,    GI/Hepatic negative GI ROS, Neg liver ROS,   Endo/Other  negative endocrine ROS  Renal/GU Renal disease     Musculoskeletal  (+) Arthritis ,   Abdominal   Peds  Hematology   Anesthesia Other Findings   Reproductive/Obstetrics                            Anesthesia Physical Anesthesia Plan  ASA: III  Anesthesia Plan: Spinal   Post-op Pain Management:    Induction: Intravenous  PONV Risk Score and Plan: 3 and Dexamethasone  Airway Management Planned: Natural Airway and Simple Face Mask  Additional Equipment:   Intra-op Plan:   Post-operative Plan:   Informed Consent: I have reviewed the patients History and Physical, chart, labs and discussed the procedure including the risks, benefits and alternatives for the proposed anesthesia with the patient or authorized representative who has indicated his/her understanding and acceptance.     Plan Discussed with: CRNA  Anesthesia Plan Comments:         Anesthesia Quick Evaluation

## 2017-03-20 NOTE — Transfer of Care (Signed)
Immediate Anesthesia Transfer of Care Note  Patient: Felicia Little  Procedure(s) Performed: RIGHT TOTAL KNEE ARTHROPLASTY WITH COMPUTER NAVIGATION (Right Knee)  Patient Location: PACU  Anesthesia Type:General  Level of Consciousness: awake, oriented and patient cooperative  Airway & Oxygen Therapy: Patient Spontanous Breathing and Patient connected to face mask oxygen  Post-op Assessment: Report given to RN and Post -op Vital signs reviewed and stable  Post vital signs: Reviewed  Last Vitals:  Vitals:   03/20/17 1516 03/20/17 1517  BP: (!) 173/85   Pulse: 82   Resp: 17   Temp:  36.5 C  SpO2: 100%     Last Pain:  Vitals:   03/20/17 1517  PainSc: 8       Patients Stated Pain Goal: 3 (32/02/33 4356)  Complications: No apparent anesthesia complications

## 2017-03-20 NOTE — Discharge Instructions (Signed)
° °Dr. Saya Mccoll °Total Joint Specialist °[facility] °[address] °[place], [place] [address] °[phone] ° °TOTAL KNEE REPLACEMENT POSTOPERATIVE DIRECTIONS ° ° ° °Knee Rehabilitation, Guidelines Following Surgery  °Results after knee surgery are often greatly improved when you follow the exercise, range of motion and muscle strengthening exercises prescribed by your doctor. Safety measures are also important to protect the knee from further injury. Any time any of these exercises cause you to have increased pain or swelling in your knee joint, decrease the amount until you are comfortable again and slowly increase them. If you have problems or questions, call your caregiver or physical therapist for advice.  ° °WEIGHT BEARING °Weight bearing as tolerated with assist device (walker, cane, etc) as directed, use it as long as suggested by your surgeon or therapist, typically at least 4-6 weeks. ° °HOME CARE INSTRUCTIONS  °Remove items at home which could result in a fall. This includes throw rugs or furniture in walking pathways.  °Continue medications as instructed at time of discharge. °You may have some home medications which will be placed on hold until you complete the course of blood thinner medication.  °You may start showering once you are discharged home but do not submerge the incision under water. Just pat the incision dry and apply a dry gauze dressing on daily. °Walk with walker as instructed.  °You may resume a sexual relationship in one month or when given the OK by your doctor.  °· Use walker as long as suggested by your caregivers. °· Avoid periods of inactivity such as sitting longer than an hour when not asleep. This helps prevent blood clots.  °You may put full weight on your legs and walk as much as is comfortable.  °You may return to work once you are cleared by your doctor.  °Do not drive a car for 6 weeks or until released by you surgeon.  °· Do not drive  while taking narcotics.  °Wear the elastic stockings for three weeks following surgery during the day but you may remove then at night. °Make sure you keep all of your appointments after your operation with all of your doctors and caregivers. You should call the office at the above phone number and make an appointment for approximately two weeks after the date of your surgery. °Do not remove your surgical dressing. The dressing is waterproof; you may take showers in 3 days, but do not take tub baths or submerge the dressing. °Please pick up a stool softener and laxative for home use as long as you are requiring pain medications. °· ICE to the affected knee every three hours for 30 minutes at a time and then as needed for pain and swelling.  Continue to use ice on the knee for pain and swelling from surgery. You may notice swelling that will progress down to the foot and ankle.  This is normal after surgery.  Elevate the leg when you are not up walking on it.   °It is important for you to complete the blood thinner medication as prescribed by your doctor. °· Continue to use the breathing machine which will help keep your temperature down.  It is common for your temperature to cycle up and down following surgery, especially at night when you are not up moving around and exerting yourself.  The breathing machine keeps your lungs expanded and your temperature down. ° °RANGE OF MOTION AND STRENGTHENING EXERCISES  °Rehabilitation of the knee is important following   a knee injury or an operation. After just a few days of immobilization, the muscles of the thigh which control the knee become weakened and shrink (atrophy). Knee exercises are designed to build up the tone and strength of the thigh muscles and to improve knee motion. Often times heat used for twenty to thirty minutes before working out will loosen up your tissues and help with improving the range of motion but do not use heat for the first two weeks following  surgery. These exercises can be done on a training (exercise) mat, on the floor, on a table or on a bed. Use what ever works the best and is most comfortable for you Knee exercises include:  °Leg Lifts - While your knee is still immobilized in a splint or cast, you can do straight leg raises. Lift the leg to 60 degrees, hold for 3 sec, and slowly lower the leg. Repeat 10-20 times 2-3 times daily. Perform this exercise against resistance later as your knee gets better.  °Quad and Hamstring Sets - Tighten up the muscle on the front of the thigh (Quad) and hold for 5-10 sec. Repeat this 10-20 times hourly. Hamstring sets are done by pushing the foot backward against an object and holding for 5-10 sec. Repeat as with quad sets.  °A rehabilitation program following serious knee injuries can speed recovery and prevent re-injury in the future due to weakened muscles. Contact your doctor or a physical therapist for more information on knee rehabilitation.  ° °SKILLED REHAB INSTRUCTIONS: °If the patient is transferred to a skilled rehab facility following release from the hospital, a list of the current medications will be sent to the facility for the patient to continue.  When discharged from the skilled rehab facility, please have the facility set up the patient's Home Health Physical Therapy prior to being released. Also, the skilled facility will be responsible for providing the patient with their medications at time of release from the facility to include their pain medication, the muscle relaxants, and their blood thinner medication. If the patient is still at the rehab facility at time of the two week follow up appointment, the skilled rehab facility will also need to assist the patient in arranging follow up appointment in our office and any transportation needs. ° °MAKE SURE YOU:  °Understand these instructions.  °Will watch your condition.  °Will get help right away if you are not doing well or get worse.   ° ° °Pick up stool softner and laxative for home use following surgery while on pain medications. °Do NOT remove your dressing. You may shower.  °Do not take tub baths or submerge incision under water. °May shower starting three days after surgery. °Please use a clean towel to pat the incision dry following showers. °Continue to use ice for pain and swelling after surgery. °Do not use any lotions or creams on the incision until instructed by your surgeon. ° °

## 2017-03-20 NOTE — Anesthesia Procedure Notes (Signed)
Procedure Name: Intubation Date/Time: 03/20/2017 12:43 PM Performed by: Jenne Campus, CRNA Pre-anesthesia Checklist: Patient identified, Emergency Drugs available, Suction available and Patient being monitored Patient Re-evaluated:Patient Re-evaluated prior to induction Oxygen Delivery Method: Circle System Utilized Preoxygenation: Pre-oxygenation with 100% oxygen Induction Type: IV induction Ventilation: Mask ventilation without difficulty Laryngoscope Size: Miller Grade View: Grade I Tube type: Oral Tube size: 7.5 mm Number of attempts: 1 Airway Equipment and Method: Stylet and Oral airway Placement Confirmation: ETT inserted through vocal cords under direct vision,  positive ETCO2 and breath sounds checked- equal and bilateral Secured at: 21 cm Tube secured with: Tape Dental Injury: Teeth and Oropharynx as per pre-operative assessment

## 2017-03-20 NOTE — Op Note (Signed)
OPERATIVE REPORT  SURGEON: Rod Can, MD   ASSISTANT: Ky Barban, RNFA.  PREOPERATIVE DIAGNOSIS: Right knee arthritis.   POSTOPERATIVE DIAGNOSIS: Right knee arthritis.   PROCEDURE: Right total knee arthroplasty.   IMPLANTS: Stryker Triathlon CR femur, size 5. Stryker Tritanium tibia, size 5. X3 polyethelyene insert, size 9 mm, CR. 3 button asymmetric patella, size 32 mm.  ANESTHESIA:  General, Regional and Spinal  TOURNIQUET TIME: Not utilized.   ESTIMATED BLOOD LOSS:-250 mL    ANTIBIOTICS: 2 g Ancef.  DRAINS: None.  COMPLICATIONS: None   CONDITION: PACU - hemodynamically stable.   BRIEF CLINICAL NOTE: Felicia Little is a 71 y.o. female with a long-standing history of Right knee arthritis. After failing conservative management, the patient was indicated for total knee arthroplasty. The risks, benefits, and alternatives to the procedure were explained, and the patient elected to proceed.  PROCEDURE IN DETAIL: Adductor canal block was obtained in the pre-op holding area. Once inside the operative room, spinal anesthesia was attempted however unsuccessful, and a foley catheter was inserted.  General anesthesia was obtained. The patient was then positioned, a nonsterile tourniquet was placed, and the lower extremity was prepped and draped in the normal sterile surgical fashion. A time-out was called verifying side and site of surgery. The patient received IV antibiotics within 60 minutes of beginning the procedure. The tourniquet was not utilized.  An anterior approach to the knee was performed utilizing a midvastus arthrotomy. A medial release was performed and the patellar fat pad was excised. Stryker navigation was used to cut the distal femur perpendicular to the mechanical axis. A freehand patellar resection was performed, and the patella was sized an prepared with 3 lug  holes.  Nagivation was used to make a neutral proximal tibia resection, taking 3 mm of bone from the less affected medial side with 3 degrees of slope. The menisci were excised. A spacer block was placed, and the alignment and balance in extension were confirmed.   The distal femur was sized using the 3-degree external rotation guide referencing the posterior femoral cortex. The appropriate 4-in-1 cutting block was pinned into place. Rotation was checked using Whiteside's line, the epicondylar axis, and then confirmed with a spacer block in flexion. The remaining femoral cuts were performed, taking care to protect the MCL.  The tibia was sized and the trial tray was pinned into place. The remaining trail components were inserted. The knee was stable to varus and valgus stress through a full range of motion. The patella tracked centrally, and the PCL was well balanced. The trial components were removed, and the proximal tibial surface was prepared. Final components were impacted into place. The knee was tested for a final time and found to be well balanced.  The wound was copiously irrigated with a dilute betadine solution followed by normal saline with pulse lavage. Marcaine solution was injected into the periarticular soft tissue. The wound was closed in layers using #1 Vicryl and Stratafix for the fascia, 2-0 Vicryl for the subcutaneous fat, 2-0 Monocryl for the deep dermal layer, 3-0 running Monocryl subcuticular Stitch, and Dermabond for the skin. Once the glue was fully dried, an Aquacell Ag and compressive dressing were applied. Tthe patient was transported to the recovery room in stable condition. Sponge, needle, and instrument counts were correct at the end of the case x2. The patient tolerated the procedure well and there were no known complications.

## 2017-03-20 NOTE — Anesthesia Procedure Notes (Addendum)
Anesthesia Regional Block: Adductor canal block   Pre-Anesthetic Checklist: ,, timeout performed, Correct Patient, Correct Site, Correct Laterality, Correct Procedure, Correct Position, site marked, Risks and benefits discussed,  Surgical consent,  Pre-op evaluation,  At surgeon's request and post-op pain management  Laterality: Right and Lower  Prep: chloraprep, alcohol swabs       Needles:   Needle Type: Echogenic Stimulator Needle     Needle Length: 9cm  Needle Gauge: 21   Needle insertion depth: 5 cm   Additional Needles:   Procedures:,,,, ultrasound used (permanent image in chart),,,,  Narrative:  Start time: 03/20/2017 11:20 AM End time: 03/20/2017 11:33 AM Injection made incrementally with aspirations every 5 mL.  Performed by: Personally  Anesthesiologist: Rica Koyanagi, MD

## 2017-03-20 NOTE — Interval H&P Note (Signed)
History and Physical Interval Note:  03/20/2017 12:18 PM  Felicia Little [name]  has presented today for surgery, with the diagnosis of Degenerative joint disease right knee  The various methods of treatment have been discussed with the patient and family. After consideration of risks, benefits and other options for treatment, the patient has consented to  Procedure(s) with comments: RIGHT TOTAL KNEE ARTHROPLASTY WITH COMPUTER NAVIGATION (Right) - Needs RNFA as a surgical intervention .  The patient's history has been reviewed, patient examined, no change in status, stable for surgery.  I have reviewed the patient's chart and labs.  Questions were answered to the patient's satisfaction.     Hilton Cork Tylen Leverich

## 2017-03-21 ENCOUNTER — Other Ambulatory Visit: Payer: Self-pay

## 2017-03-21 ENCOUNTER — Encounter (HOSPITAL_COMMUNITY): Payer: Self-pay | Admitting: Orthopedic Surgery

## 2017-03-21 LAB — BASIC METABOLIC PANEL
ANION GAP: 4 — AB (ref 5–15)
BUN: 14 mg/dL (ref 6–20)
CO2: 25 mmol/L (ref 22–32)
Calcium: 8.3 mg/dL — ABNORMAL LOW (ref 8.9–10.3)
Chloride: 108 mmol/L (ref 101–111)
Creatinine, Ser: 0.95 mg/dL (ref 0.44–1.00)
GFR calc non Af Amer: 58 mL/min — ABNORMAL LOW (ref >60)
Glucose, Bld: 120 mg/dL — ABNORMAL HIGH (ref 65–99)
POTASSIUM: 4.6 mmol/L (ref 3.5–5.1)
Sodium: 137 mmol/L (ref 135–145)

## 2017-03-21 LAB — CBC
HEMATOCRIT: 31.1 % — AB (ref 36.0–46.0)
HEMOGLOBIN: 9.9 g/dL — AB (ref 12.0–15.0)
MCH: 27.3 pg (ref 26.0–34.0)
MCHC: 31.8 g/dL (ref 30.0–36.0)
MCV: 85.7 fL (ref 78.0–100.0)
Platelets: 236 10*3/uL (ref 150–400)
RBC: 3.63 MIL/uL — AB (ref 3.87–5.11)
RDW: 14.3 % (ref 11.5–15.5)
WBC: 14.8 10*3/uL — AB (ref 4.0–10.5)

## 2017-03-21 MED ORDER — ONDANSETRON HCL 4 MG PO TABS: 4.0000 mg | ORAL_TABLET | Freq: Four times a day (QID) | ORAL | 0 refills | Status: DC | PRN

## 2017-03-21 MED ORDER — SENNA 8.6 MG PO TABS: 2.0000 | ORAL_TABLET | Freq: Every day | ORAL | 0 refills | Status: DC

## 2017-03-21 MED ORDER — DOCUSATE SODIUM 100 MG PO CAPS: 100.0000 mg | ORAL_CAPSULE | Freq: Two times a day (BID) | ORAL | 1 refills | Status: DC

## 2017-03-21 MED ORDER — HYDROCODONE-ACETAMINOPHEN 5-325 MG PO TABS: 1.0000 | ORAL_TABLET | Freq: Four times a day (QID) | ORAL | 0 refills | Status: DC | PRN

## 2017-03-21 MED ORDER — ASPIRIN 81 MG PO CHEW: 81.0000 mg | CHEWABLE_TABLET | Freq: Two times a day (BID) | ORAL | 1 refills | Status: AC

## 2017-03-21 NOTE — Care Management Note (Signed)
Case Management Note  Patient Details  Name: SHARETA FISHBAUGH MRN: [id number] Date of Birth: [birth year]  Subjective/Objective:                    Action/Plan:  Discussed discharge planning with patient and daughter at bedside. Plan is for OP PT at [facility] . Patient states she has not heard from same for appointment. NCM called [employer] spoke with Jan Fireman has order for OP PT . Candy asked NCM to ask patient to call [facility] to schedule appointment. Message given to patient and daughter. They will call to schedule an appointment . Patient has all DME from prior surgeries. Expected Discharge Date:                  Expected Discharge Plan:  Home/Self Care  In-House Referral:     Discharge planning Services  CM Consult  Post Acute Care Choice:  Durable Medical Equipment Choice offered to:  Patient, Adult Children  DME Arranged:    DME Agency:     HH Arranged:    [place] Agency:     Status of Service:  Completed, signed off  If discussed at [place] of Stay Meetings, dates discussed:    Additional Comments:  Marilu Favre, RN 03/21/2017, 11:42 AM

## 2017-03-21 NOTE — Consult Note (Signed)
[facility] CM Primary Care Navigator  03/21/2017  Felicia Little [birth year] [phone]   Met with patientand grandson (Felicia Little)at the bedside to identify possible discharge needs. Patient reports having history of worsening pain and functional disability to the right knee due to arthritis which failed non-surgical conservative treatments that resulted to this admission/ surgery.  Patientendorses Dr. Alma Little with [place] [facility] the primary care provider.   Patient shared [place] to obtain medications without any problem.   Patient reports that shemanages her own medications at home using "pill box"systemfilled every week.  She verbalized that her daughters have been providingtransportation to herdoctors' appointments.  Patient lives alone and independent with self care but her four daughters provide assistance with whatever things she needs and serve as her primary caregivers at home as stated.   Anticipated discharge plan ishome with Outpatient therapy per patient.  Patient voiced understanding to call primary care provider's officewhen she returns homefor a post discharge follow-up appointment within 1-2 weeks or sooner if needs arise.Patient letter (with PCP's contact number) wasprovided as a reminder.  Explained to [place] services available for healthmanagement at [facility] current needs or concerns at this time.  Encouraged patientto seekreferralfrom primary care providerto [facility] care management if deemed necessary andappropriate for services in the future.  [facility] care management information provided for future needs that she may have.   Patient however, opted and verbally agreed for [facility] calls to follow-up with herrecovery at home.   Referral made to [place] calls after discharge.   For additional questions please contact:  Edwena Felty A.  Karinda Cabriales, BSN, RN-BC [facility] PRIMARY CARE Navigator Cell: [phone]

## 2017-03-21 NOTE — Evaluation (Signed)
Physical Therapy Evaluation Patient Details Name: Felicia Little MRN: [id number] DOB: October 23, 1945 Today's Date: 03/21/2017   History of Present Illness  Felicia Little is a 71yo black female who comes to [facility] on 12/17 for Right TKA. PMH: low back pain, Rt shoulder pain, Rt anterior hip revision, bilat THA, Rt knee scope, cholecystectomy. At baseline, pt performs mostly limited community distances with 2 SPC,.   Clinical Impression  Pt admitted with above diagnosis. Pt currently with functional limitations due to the deficits listed below (see "PT Problem List"). Upon entry, the patient is received semirecumbent in bed, daughter present. The pt is awake and agreeable to participate. No acute distress noted at this time. The pt is alert and oriented x3, pleasant, conversational, and following simple and multi-step commands consistently. Functional mobility assessment demonstrates mild-moderate strength impairment in RLE, the pt now requiring Min-assist physical assistance for transfers, and near maximal effort to perform AMB, whereas the patient performed these at a higher level of independence PTA. Right knee flexion ROM: 13-81 degrees, AMB 174ft c RW at minGuard assist and WBAT. Empirically, the patient demonstrates increased risk of recurrent falls AEB gait speed <0.76m/s and forward reach <5". Pt will benefit from skilled PT intervention to increase independence and safety with basic mobility in preparation for discharge to the venue listed below.       Follow Up Recommendations Outpatient PT;Supervision - Intermittent    Equipment Recommendations  Rolling walker with 5" wheels    Recommendations for Other Services       Precautions / Restrictions Precautions Precautions: Fall;Knee Restrictions Weight Bearing Restrictions: Yes RLE Weight Bearing: Weight bearing as tolerated      Mobility  Bed Mobility Overal bed mobility: Modified Independent             General bed mobility comments:  req additional time/effort  Transfers Overall transfer level: Needs assistance Equipment used: Rolling walker (2 wheeled) Transfers: Sit to/from Stand Sit to Stand: Min assist         General transfer comment: req additional time/effort  Ambulation/Gait Ambulation/Gait assistance: Min guard Ambulation Distance (Feet): 100 Feet Assistive device: Rolling walker (2 wheeled) Gait Pattern/deviations: Step-to pattern;Decreased step length - left Gait velocity: 0.22m/s       Stairs            Wheelchair Mobility    Modified Rankin (Stroke Patients Only)       Balance Overall balance assessment: Needs assistance                                           Pertinent Vitals/Pain Pain Assessment: 0-10 Pain Score: 4  Pain Location: Rt knee operative site  Pain Descriptors / Indicators: Aching Pain Intervention(s): Limited activity within patient's tolerance;Monitored during session;Repositioned;Premedicated before session    Home Living Family/patient expects to be discharged to:: Private residence Living Arrangements: Alone Available Help at Discharge: Family;Available 24 hours/day Type of Home: Apartment Home Access: Level entry     Home Layout: One level Home Equipment: Cane - single point;Walker - 2 wheels;Shower seat;Bedside commode      Prior Function Level of Independence: Independent with assistive device(s)               Hand Dominance        Extremity/Trunk Assessment        Lower Extremity Assessment Lower Extremity Assessment: RLE deficits/detail RLE Deficits /  Details: requires min-modA to perform HEP        Communication   Communication: No difficulties  Cognition Arousal/Alertness: Awake/alert Behavior During Therapy: WFL for tasks assessed/performed Overall Cognitive Status: Within Functional Limits for tasks assessed                                        General Comments       Exercises Total Joint Exercises Ankle Circles/Pumps: AAROM;Both;10 reps;Supine Quad Sets: AROM;Right;10 reps;Supine Gluteal Sets: AROM;Right;10 reps;Supine Short Arc Quad: Right;AAROM;10 reps;Supine Heel Slides: AAROM;Right;10 reps;Supine Hip ABduction/ADduction: AAROM;Supine;Right;10 reps Straight Leg Raises: AROM;AAROM;Right;10 reps;Supine Goniometric ROM: Right knee flexion ROM: 13-81 degrees   Assessment/Plan    PT Assessment Patient needs continued PT services  PT Problem List Decreased activity tolerance;Decreased range of motion;Decreased strength;Decreased knowledge of precautions;Decreased balance;Decreased mobility;Pain;Decreased skin integrity       PT Treatment Interventions DME instruction;Gait training;Functional mobility training;Therapeutic activities;Therapeutic exercise;Patient/family education;Balance training    PT Goals (Current goals can be found in the Care Plan section)  Acute Rehab PT Goals Patient Stated Goal: regain ease of AMB  PT Goal Formulation: With patient Time For Goal Achievement: 04/04/17 Potential to Achieve Goals: Good    Frequency 7X/week   Barriers to discharge        Co-evaluation               AM-PAC PT "6 Clicks" Daily Activity  Outcome Measure Difficulty turning over in bed (including adjusting bedclothes, sheets and blankets)?: A Little Difficulty moving from lying on back to sitting on the side of the bed? : A Little Difficulty sitting down on and standing up from a chair with arms (e.g., wheelchair, bedside commode, etc,.)?: Unable Help needed moving to and from a bed to chair (including a wheelchair)?: Total Help needed walking in hospital room?: A Lot Help needed climbing 3-5 steps with a railing? : A Lot 6 Click Score: 12    End of Session Equipment Utilized During Treatment: Gait belt Activity Tolerance: Patient tolerated treatment well;No increased pain;Patient limited by fatigue Patient left: in chair;with  chair alarm set;with call bell/phone within reach;with family/visitor present Nurse Communication: Mobility status PT Visit Diagnosis: Unsteadiness on feet (R26.81);Other abnormalities of gait and mobility (R26.89);Difficulty in walking, not elsewhere classified (R26.2);Muscle weakness (generalized) (M62.81)    Time: 6761-9509 PT Time Calculation (min) (ACUTE ONLY): 36 min   Charges:   PT Evaluation $PT Eval Moderate Complexity: 1 Mod PT Treatments $Therapeutic Exercise: 8-22 mins $Therapeutic Activity: 8-22 mins   PT G Codes:   PT G-Codes **NOT FOR INPATIENT CLASS** Functional Assessment Tool Used: AM-PAC 6 Clicks Basic Mobility;Clinical judgement Functional Limitation: Mobility: Walking and moving around Mobility: Walking and Moving Around Current Status (T2671): At least 60 percent but less than 80 percent impaired, limited or restricted Mobility: Walking and Moving Around Goal Status [phone]): At least 40 percent but less than 60 percent impaired, limited or restricted    12:30 PM, 03/21/17 Etta Grandchild, PT, DPT Relief Physical Therapist - [place] [phone] (Pager)  [phone] [facility])  [phone] (Office)      Rionna Feltes C 03/21/2017, 12:27 PM

## 2017-03-21 NOTE — Progress Notes (Signed)
Physical Therapy Treatment Patient Details Name: Felicia Little MRN: [id number] DOB: [birth year] Today's Date: 03/21/2017    History of Present Illness Felicia Little is a 71yo black female who comes to [facility] on 12/17 for Right TKA. PMH: low back pain, Rt shoulder pain, Rt anterior hip revision, bilat THA, Rt knee scope, cholecystectomy. At baseline, pt performs mostly limited community distances with 2 SPC,.     PT Comments    Session with focus on progressing functional mobility. Pt demonstrates higher level of independence in bed mobility,  transfers, and AMB compared to this morning's session. This session pt AMB 268ft c RW @ 0.55m/s at supervision level assist. Pt is pleased with her current state thus far. Pt making good progress toward goals, still requires some VC for safety and safe use of A/E during transfers, will have adequate social support, and is ready for DC from PT perspective.    Follow Up Recommendations  Outpatient PT;Supervision - Intermittent     Equipment Recommendations  Rolling walker with 5" wheels    Recommendations for Other Services       Precautions / Restrictions Precautions Precautions: Fall;Knee Restrictions Weight Bearing Restrictions: Yes RLE Weight Bearing: Weight bearing as tolerated    Mobility  Bed Mobility Overal bed mobility: Modified Independent             General bed mobility comments: pt in chair  Transfers Overall transfer level: Needs assistance Equipment used: Rolling walker (2 wheeled) Transfers: Sit to/from Stand Sit to Stand: Supervision         General transfer comment: cues for hand placement, no assist  Ambulation/Gait Ambulation/Gait assistance: Supervision Ambulation Distance (Feet): 250 Feet Assistive device: Rolling walker (2 wheeled) Gait Pattern/deviations: Decreased step length - left;Step-through pattern Gait velocity: 0.29m/s (0.79m/s this morning) Gait velocity interpretation: <1.8 ft/sec, indicative of  risk for recurrent falls     Stairs            Wheelchair Mobility    Modified Rankin (Stroke Patients Only)       Balance Overall balance assessment: Needs assistance   Sitting balance-Leahy Scale: Good       Standing balance-Leahy Scale: Fair Standing balance comment: can release walker in standing at sink                            Cognition Arousal/Alertness: Awake/alert Behavior During Therapy: Impulsive Overall Cognitive Status: Within Functional Limits for tasks assessed                                 General Comments: talkitive; requires reminded to attend to task during gait training and AMB.       Exercises  Short Arc Quad: AROM;Right;20 reps;Supine(without assist) Heel Slides: AROM;Right;10 reps;Supine(performs independently without assist)     General Comments        Pertinent Vitals/Pain Pain Assessment: 0-10 Pain Score: 7 (during AMB ) Faces Pain Scale: Hurts little more Pain Location: Rt knee operative site  Pain Descriptors / Indicators: Sore;Aching Pain Intervention(s): Limited activity within patient's tolerance;Monitored during session    Home Living Family/patient expects to be discharged to:: Private residence Living Arrangements: Alone Available Help at Discharge: Family;Available 24 hours/day Type of Home: Apartment Home Access: Level entry   Home Layout: One level Home Equipment: Cane - single point;Walker - 2 wheels;Shower seat;Bedside commode;Grab bars - tub/shower  Prior Function Level of Independence: Independent with assistive device(s)          PT Goals (current goals can now be found in the care plan section) Acute Rehab PT Goals Patient Stated Goal: regain ease of AMB  PT Goal Formulation: With patient Time For Goal Achievement: 04/04/17 Potential to Achieve Goals: Good Progress towards PT goals: Progressing toward goals    Frequency    7X/week      PT Plan       Co-evaluation              AM-PAC PT "6 Clicks" Daily Activity  Outcome Measure  Difficulty turning over in bed (including adjusting bedclothes, sheets and blankets)?: A Little Difficulty moving from lying on back to sitting on the side of the bed? : A Little Difficulty sitting down on and standing up from a chair with arms (e.g., wheelchair, bedside commode, etc,.)?: A Little Help needed moving to and from a bed to chair (including a wheelchair)?: A Little Help needed walking in hospital room?: A Little Help needed climbing 3-5 steps with a railing? : A Lot 6 Click Score: 17    End of Session Equipment Utilized During Treatment: Gait belt Activity Tolerance: Patient tolerated treatment well;No increased pain Patient left: in chair;with chair alarm set;with call bell/phone within reach;with family/visitor present Nurse Communication: Mobility status PT Visit Diagnosis: Unsteadiness on feet (R26.81);Other abnormalities of gait and mobility (R26.89);Difficulty in walking, not elsewhere classified (R26.2);Muscle weakness (generalized) (M62.81)     Time: 4332-9518 PT Time Calculation (min) (ACUTE ONLY): 22 min  Charges:   $Therapeutic Activity: 8-22 mins                      2:24 PM, 03/21/17 Etta Grandchild, PT, DPT Relief Physical Therapist - [place] [phone] (Pager)  [phone] (Mobile)  [phone] (Office)       Buccola,Allan C 03/21/2017, 2:21 PM

## 2017-03-21 NOTE — Evaluation (Signed)
Occupational Therapy Evaluation Patient Details Name: Felicia Little MRN: [id number] DOB: 02/21/1946 Today's Date: 03/21/2017    History of Present Illness Felicia Little is a 71yo black female who comes to [facility] on 12/17 for Right TKA. PMH: low back pain, Rt shoulder pain, Rt anterior hip revision, bilat THA, Rt knee scope, cholecystectomy. At baseline, pt performs mostly limited community distances with 2 SPC,.    Clinical Impression   Pt was independent with AD prior to admission. Currently functioning at a min to supervision level in ADL. Pt demonstrating some impulsivity requiring close supervision for mobility. Educated in compensatory strategies for ADL. Pt will have assistance of her family until she is able to complete ADL and IADL independently. Pt and daughter educated in safe footwear and how to transport items safely with RW. No further OT needs.    Follow Up Recommendations  No OT follow up    Equipment Recommendations  None recommended by OT    Recommendations for Other Services       Precautions / Restrictions Precautions Precautions: Fall;Knee Restrictions Weight Bearing Restrictions: Yes RLE Weight Bearing: Weight bearing as tolerated      Mobility Bed Mobility             General bed mobility comments: pt in chair  Transfers Overall transfer level: Needs assistance Equipment used: Rolling walker (2 wheeled) Transfers: Sit to/from Stand Sit to Stand: Supervision         General transfer comment: cues for hand placement, no assist    Balance Overall balance assessment: Needs assistance   Sitting balance-Leahy Scale: Good       Standing balance-Leahy Scale: Fair Standing balance comment: can release walker in standing at sink                           ADL either performed or assessed with clinical judgement   ADL Overall ADL's : Needs assistance/impaired Eating/Feeding: Independent;Sitting   Grooming: Wash/dry  hands;Standing;Supervision/safety   Upper Body Bathing: Set up;Sitting   Lower Body Bathing: Minimal assistance;Sit to/from stand Lower Body Bathing Details (indicate cue type and reason): recommended long bath sponge Upper Body Dressing : Set up;Sitting   Lower Body Dressing: Minimal assistance;Sit to/from stand Lower Body Dressing Details (indicate cue type and reason): assist for R sock and to start pants over legs, pt can nearly reach her R foot, will rely on assist of family Toilet Transfer: RW;Ambulation;Comfort height toilet;Grab bars;Supervision/safety   Toileting- Clothing Manipulation and Hygiene: Sit to/from stand;Supervision/safety     Tub/Shower Transfer Details (indicate cue type and reason): instructed in technique Functional mobility during ADLs: Supervision/safety;Rolling walker       Vision Baseline Vision/History: Wears glasses Wears Glasses: At all times Patient Visual Report: No change from baseline       Perception     Praxis      Pertinent Vitals/Pain Pain Assessment: Faces Pain Score: 4  Faces Pain Scale: Hurts a little bit Pain Location: Rt knee operative site  Pain Descriptors / Indicators: Sore Pain Intervention(s): Monitored during session;Repositioned     Hand Dominance Right   Extremity/Trunk Assessment Upper Extremity Assessment Upper Extremity Assessment: Overall WFL for tasks assessed   Lower Extremity Assessment Lower Extremity Assessment: Defer to PT evaluation        Communication Communication Communication: No difficulties   Cognition Arousal/Alertness: Awake/alert Behavior During Therapy: Impulsive Overall Cognitive Status: Within Functional Limits for tasks assessed  General Comments       Exercises   Shoulder Instructions      Home Living Family/patient expects to be discharged to:: Private residence Living Arrangements: Alone Available Help at Discharge:  Family;Available 24 hours/day Type of Home: Apartment Home Access: Level entry     Home Layout: One level     Bathroom Shower/Tub: Teacher, early years/pre: Standard     Home Equipment: Cane - single point;Walker - 2 wheels;Shower seat;Bedside commode;Grab bars - tub/shower          Prior Functioning/Environment Level of Independence: Independent with assistive device(s)                 OT Problem List: Pain;Decreased knowledge of use of DME or AE      OT Treatment/Interventions:      OT Goals(Current goals can be found in the care plan section) Acute Rehab OT Goals Patient Stated Goal: regain ease of AMB   OT Frequency:     Barriers to D/C:            Co-evaluation              AM-PAC PT "6 Clicks" Daily Activity     Outcome Measure Help from another person eating meals?: None Help from another person taking care of personal grooming?: A Little Help from another person toileting, which includes using toliet, bedpan, or urinal?: A Little Help from another person bathing (including washing, rinsing, drying)?: A Little Help from another person to put on and taking off regular upper body clothing?: None Help from another person to put on and taking off regular lower body clothing?: A Little 6 Click Score: 20   End of Session Equipment Utilized During Treatment: Gait belt;Rolling walker  Activity Tolerance: Patient tolerated treatment well Patient left: in bed(EOB with NT)  OT Visit Diagnosis: Unsteadiness on feet (R26.81);Pain                Time: 1610-9604 OT Time Calculation (min): 15 min Charges:  OT General Charges $OT Visit: 1 Visit OT Evaluation $OT Eval Low Complexity: 1 Low G-Codes:     Malka So 03/21/2017, 12:42 PM  03/21/2017 Nestor Lewandowsky, OTR/L Pager: [phone]

## 2017-03-21 NOTE — Progress Notes (Signed)
   Subjective:  Patient reports pain as mild to moderate.  No c/o.  Objective:   VITALS:   Vitals:   03/20/17 2101 03/20/17 2147 03/21/17 0235 03/21/17 0559  BP: 140/89 (!) 158/85 (!) 169/88 134/77  Pulse: 75 74 87 82  Resp: (!) 6 12 14 15   Temp:  97.9 F (36.6 C) 98 F (36.7 C) 98.1 F (36.7 C)  TempSrc:  Oral Oral Oral  SpO2: 97% 99% 96% 96%  Weight:  78.9 kg (174 lb)    Height:  5\' 5"  (1.651 m)      NAD ABD soft Sensation intact distally Intact pulses distally Dorsiflexion/Plantar flexion intact Incision: dressing C/D/I Compartment soft   Lab Results  Component Value Date   WBC 14.8 (H) 03/21/2017   HGB 9.9 (L) 03/21/2017   HCT 31.1 (L) 03/21/2017   MCV 85.7 03/21/2017   PLT 236 03/21/2017   BMET    Component Value Date/Time   NA 137 03/21/2017 0530   NA 140 09/03/2015 1459   K 4.6 03/21/2017 0530   CL 108 03/21/2017 0530   CO2 25 03/21/2017 0530   GLUCOSE 120 (H) 03/21/2017 0530   BUN 14 03/21/2017 0530   BUN 11 09/03/2015 1459   CREATININE 0.95 03/21/2017 0530   CALCIUM 8.3 (L) 03/21/2017 0530   GFRNONAA 58 (L) 03/21/2017 0530   GFRAA >60 03/21/2017 0530     Assessment/Plan: 1 Day Post-Op   Principal Problem:   Osteoarthritis of right knee   WBAT with walker ASA, SCDs, TEDs PO pain control PT/OT Dispo: D/C home tomorrow with outpatient PT   Hilton Cork Ryler Laskowski 03/21/2017, 7:41 AM   Rod Can, MD Cell [phone]

## 2017-03-21 NOTE — Discharge Summary (Signed)
Physician Discharge Summary  Patient ID: Felicia Little MRN: [id number] DOB/AGE: Dec 07, 1945 71 y.o.  Admit date: 03/20/2017 Discharge date: 03/22/2017  Admission Diagnoses:  Osteoarthritis of right knee  Discharge Diagnoses:  Principal Problem:   Osteoarthritis of right knee   Past Medical History:  Diagnosis Date  . Anxiety   . Arthritis    "all over" (03/21/2017)  . Chronic lower back pain   . Chronic prescription benzodiazepine use 09/03/2015  . Depression   . OYDXAJOI(786.7)    "maybe once/week" (04/06/2012) - none recently  . High cholesterol   . History of blood transfusion 2007   "related to my 2nd hip OR" (03/21/2017)  . History of kidney stones   . Hx of smoking 10/22/2014  . Hypertension   . Pneumonia ~ 1995; 1996  . Renal insufficiency 09/03/2015    Surgeries: Procedure(s): RIGHT TOTAL KNEE ARTHROPLASTY WITH COMPUTER NAVIGATION on 03/20/2017   Consultants (if any):   Discharged Condition: Improved  Hospital Course: Felicia Little is an 71 y.o. female who was admitted 03/20/2017 with a diagnosis of Osteoarthritis of right knee and went to the operating room on 03/20/2017 and underwent the above named procedures.    She was given perioperative antibiotics:  Anti-infectives (From admission, onward)   Start     Dose/Rate Route Frequency Ordered Stop   03/20/17 2130  ceFAZolin (ANCEF) IVPB 2g/100 mL premix     2 g 200 mL/hr over 30 Minutes Intravenous Every 6 hours 03/20/17 1915 03/21/17 0430   03/20/17 1130  ceFAZolin (ANCEF) IVPB 2g/100 mL premix     2 g 200 mL/hr over 30 Minutes Intravenous On call to O.R. 03/20/17 0958 03/20/17 1244   03/20/17 1013  ceFAZolin (ANCEF) 2-4 GM/100ML-% IVPB    Comments:  Nyoka Cowden   : cabinet override      03/20/17 1013 03/20/17 1244    .  She was given sequential compression devices, early ambulation, and ASA for DVT prophylaxis.  She benefited maximally from the hospital stay and there were no complications.     Recent vital signs:  Vitals:   03/21/17 2143 03/22/17 0556  BP: (!) 189/78 (!) 166/63  Pulse: 96 90  Resp: 18 18  Temp: 98.2 F (36.8 C) 98.6 F (37 C)  SpO2: 95% 100%    Recent laboratory studies:  Lab Results  Component Value Date   HGB 9.1 (L) 03/22/2017   HGB 9.9 (L) 03/21/2017   HGB 13.2 03/14/2017   Lab Results  Component Value Date   WBC 13.5 (H) 03/22/2017   PLT 205 03/22/2017   Lab Results  Component Value Date   INR 1.10 01/15/2015   Lab Results  Component Value Date   NA 137 03/21/2017   K 4.6 03/21/2017   CL 108 03/21/2017   CO2 25 03/21/2017   BUN 14 03/21/2017   CREATININE 0.95 03/21/2017   GLUCOSE 120 (H) 03/21/2017    Discharge Medications:   Allergies as of 03/22/2017      Reactions   Other Swelling   VARIOUS METALS cause internal swelling      Medication List    STOP taking these medications   aspirin EC 81 MG tablet Replaced by:  aspirin 81 MG chewable tablet   traMADol-acetaminophen 37.5-325 MG tablet Commonly known as:  ULTRACET     TAKE these medications   aspirin 81 MG chewable tablet Chew 1 tablet (81 mg total) by mouth 2 (two) times daily. Replaces:  aspirin EC 81 MG  tablet   atenolol 50 MG tablet Commonly known as:  TENORMIN Take 1 tablet (50 mg total) by mouth daily.   docusate sodium 100 MG capsule Commonly known as:  COLACE Take 1 capsule (100 mg total) by mouth 2 (two) times daily.   gabapentin 300 MG capsule Commonly known as:  NEURONTIN Take 2 capsules (600 mg total) by mouth at bedtime.   HYDROcodone-acetaminophen 5-325 MG tablet Commonly known as:  NORCO/VICODIN Take 1-2 tablets by mouth every 6 (six) hours as needed (knee pain).   nortriptyline 75 MG capsule Commonly known as:  PAMELOR TAKE ONE CAPSULE BY MOUTH ONCE DAILY AT BEDTIME   ondansetron 4 MG tablet Commonly known as:  ZOFRAN Take 1 tablet (4 mg total) by mouth every 6 (six) hours as needed for nausea.   pravastatin 20 MG  tablet Commonly known as:  PRAVACHOL Take 1 tablet (20 mg total) by mouth at bedtime.   senna 8.6 MG Tabs tablet Commonly known as:  SENOKOT Take 2 tablets (17.2 mg total) by mouth at bedtime.   triamterene-hydrochlorothiazide 75-50 MG tablet Commonly known as:  MAXZIDE Take 0.5 tablets by mouth daily. What changed:    when to take this  reasons to take this       Diagnostic Studies: Dg Knee Right Port  Result Date: 03/20/2017 CLINICAL DATA:  71 year old female post right knee replacement. Initial encounter. EXAM: PORTABLE RIGHT KNEE - 1-2 VIEW COMPARISON:  None. FINDINGS: Post total right knee replacement. Minimal lucency between the lateral and posterior aspect of the tibial component of the prosthesis and bone. Vascular calcifications. IMPRESSION: Post total right knee replacement. Minimal lucency between the lateral and posterior aspect of the tibial component of the prosthesis and bone. Electronically Signed   By: Genia Del M.D.   On: 03/20/2017 15:50    Disposition: 01-Home or Self Care  Discharge Instructions    Call MD / Call 911   Complete by:  As directed    If you experience chest pain or shortness of breath, CALL 911 and be transported to the hospital emergency room.  If you develope a fever above 101 F, pus (white drainage) or increased drainage or redness at the wound, or calf pain, call your surgeon's office.   Constipation Prevention   Complete by:  As directed    Drink plenty of fluids.  Prune juice may be helpful.  You may use a stool softener, such as Colace (over the counter) 100 mg twice a day.  Use MiraLax (over the counter) for constipation as needed.   Diet - low sodium heart healthy   Complete by:  As directed    Do not put a pillow under the knee. Place it under the heel.   Complete by:  As directed    Driving restrictions   Complete by:  As directed    No driving for 6 weeks   Increase activity slowly as tolerated   Complete by:  As directed     Lifting restrictions   Complete by:  As directed    No lifting for 6 weeks   TED hose   Complete by:  As directed    Use stockings (TED hose) for 2 weeks on both leg(s).  You may remove them at night for sleeping.      Follow-up Information    Londynn Sonoda, Aaron Edelman, MD. Schedule an appointment as soon as possible for a visit in 2 weeks.   Specialty:  Orthopedic Surgery Why:  For wound  re-check Contact information: [address] [place]  [address] [phone]            Signed: Hilton Cork Kole Hilyard 03/22/2017, 4:29 PM

## 2017-03-22 LAB — CBC
HCT: 28.8 % — ABNORMAL LOW (ref 36.0–46.0)
Hemoglobin: 9.1 g/dL — ABNORMAL LOW (ref 12.0–15.0)
MCH: 26.8 pg (ref 26.0–34.0)
MCHC: 31.6 g/dL (ref 30.0–36.0)
MCV: 85 fL (ref 78.0–100.0)
PLATELETS: 205 10*3/uL (ref 150–400)
RBC: 3.39 MIL/uL — AB (ref 3.87–5.11)
RDW: 14.3 % (ref 11.5–15.5)
WBC: 13.5 10*3/uL — ABNORMAL HIGH (ref 4.0–10.5)

## 2017-03-22 NOTE — Plan of Care (Signed)
  Education: Knowledge of General Education information will improve 03/22/2017 0516 - Progressing by Anson Fret, RN Note POC reviewed with pt.

## 2017-03-22 NOTE — Progress Notes (Signed)
Right knee dressing dry and intact. Ted hose in place. Discharge instructions given to pt. Discharged to home accompanied by daughter.

## 2017-03-22 NOTE — Progress Notes (Signed)
   Subjective:  Patient reports pain as mild to moderate.  No c/o. Wants to go home.  Objective:   VITALS:   Vitals:   03/21/17 0950 03/21/17 1321 03/21/17 2143 03/22/17 0556  BP:  (!) 159/76 (!) 189/78 (!) 166/63  Pulse: 95 80 96 90  Resp:  18 18 18   Temp:  98.4 F (36.9 C) 98.2 F (36.8 C) 98.6 F (37 C)  TempSrc:  Oral Oral Oral  SpO2: 93% 97% 95% 100%  Weight:      Height:        NAD ABD soft Sensation intact distally Intact pulses distally Dorsiflexion/Plantar flexion intact Incision: dressing C/D/I Compartment soft   Lab Results  Component Value Date   WBC 13.5 (H) 03/22/2017   HGB 9.1 (L) 03/22/2017   HCT 28.8 (L) 03/22/2017   MCV 85.0 03/22/2017   PLT 205 03/22/2017   BMET    Component Value Date/Time   NA 137 03/21/2017 0530   NA 140 09/03/2015 1459   K 4.6 03/21/2017 0530   CL 108 03/21/2017 0530   CO2 25 03/21/2017 0530   GLUCOSE 120 (H) 03/21/2017 0530   BUN 14 03/21/2017 0530   BUN 11 09/03/2015 1459   CREATININE 0.95 03/21/2017 0530   CALCIUM 8.3 (L) 03/21/2017 0530   GFRNONAA 58 (L) 03/21/2017 0530   GFRAA >60 03/21/2017 0530     Assessment/Plan: 2 Days Post-Op   Principal Problem:   Osteoarthritis of right knee   WBAT with walker ASA, SCDs, TEDs PO pain control PT/OT Dispo: D/C home with outpatient PT (already set up)   Felicia Little Felicia Little 03/22/2017, 7:16 AM   Rod Can, MD Cell [phone]

## 2017-03-22 NOTE — Progress Notes (Signed)
Physical Therapy Treatment Patient Details Name: Felicia Little MRN: [id number] DOB: Aug 14, 1945 Today's Date: 03/22/2017    History of Present Illness Felicia Little is a 71yo black female who comes to [facility] on 12/17 for Right TKA. PMH: low back pain, Rt shoulder pain, Rt anterior hip revision, bilat THA, Rt knee scope, cholecystectomy. At baseline, pt performs mostly limited community distances with 2 SPC,.     PT Comments    Pt is POD #2 and is mobilizing well at a supervision level.  She has no STE her home, so did not review prior to her d/c.  HEP packet given and reviewed in its entirety.  Pt due to d/c home today with her daughters assisting at home.  PT to follow acutely until d/c confirmed.     Follow Up Recommendations  Outpatient PT;Supervision - Intermittent     Equipment Recommendations  Rolling walker with 5" wheels    Recommendations for Other Services   NA     Precautions / Restrictions Precautions Precautions: Fall;Knee Precaution Booklet Issued: Yes (comment) Precaution Comments: knee exercise handout given precaution reviewed Restrictions RLE Weight Bearing: Weight bearing as tolerated    Mobility  Bed Mobility Overal bed mobility: Modified Independent                Transfers Overall transfer level: Needs assistance Equipment used: Rolling walker (2 wheeled) Transfers: Sit to/from Stand Sit to Stand: Supervision         General transfer comment: supervision for safety, verbal cues for safe hand placement and RW use during transitions  Ambulation/Gait Ambulation/Gait assistance: Supervision Ambulation Distance (Feet): 150 Feet Assistive device: Rolling walker (2 wheeled) Gait Pattern/deviations: Step-through pattern;Antalgic Gait velocity: decreased Gait velocity interpretation: Below normal speed for age/gender General Gait Details: mildly antalgic gait pattern, verbal cues for closer proximity to RW.           Balance Overall balance  assessment: Needs assistance Sitting-balance support: Feet supported;No upper extremity supported Sitting balance-Leahy Scale: Good     Standing balance support: No upper extremity supported Standing balance-Leahy Scale: Fair Standing balance comment: supervision                            Cognition Arousal/Alertness: Awake/alert Behavior During Therapy: WFL for tasks assessed/performed Overall Cognitive Status: Within Functional Limits for tasks assessed                                        Exercises Total Joint Exercises Ankle Circles/Pumps: AROM;Both;20 reps Quad Sets: AROM;Right;10 reps Towel Squeeze: AROM;Both;10 reps Short Arc Quad: AROM;Right;10 reps Heel Slides: AAROM;Right;10 reps Hip ABduction/ADduction: AROM;Right;10 reps Straight Leg Raises: AROM;Right;10 reps Long Arc Quad: AROM;Right;10 reps Knee Flexion: AROM;Right;10 reps;AAROM Goniometric ROM: 10-75        Pertinent Vitals/Pain Pain Assessment: 0-10 Pain Score: 7  Pain Location: right knee Pain Descriptors / Indicators: Sore;Aching Pain Intervention(s): Limited activity within patient's tolerance;Monitored during session;Repositioned;Premedicated before session           PT Goals (current goals can now be found in the care plan section) Acute Rehab PT Goals Patient Stated Goal: regain ease of AMB  Progress towards PT goals: Progressing toward goals    Frequency    7X/week      PT Plan Current plan remains appropriate       AM-PAC PT "6 Clicks"  Daily Activity  Outcome Measure  Difficulty turning over in bed (including adjusting bedclothes, sheets and blankets)?: None Difficulty moving from lying on back to sitting on the side of the bed? : None Difficulty sitting down on and standing up from a chair with arms (e.g., wheelchair, bedside commode, etc,.)?: None Help needed moving to and from a bed to chair (including a wheelchair)?: None Help needed walking in  hospital room?: None Help needed climbing 3-5 steps with a railing? : A Little 6 Click Score: 23    End of Session Equipment Utilized During Treatment: Gait belt Activity Tolerance: Patient limited by pain;Patient limited by fatigue Patient left: in chair;with call bell/phone within reach;with family/visitor present Nurse Communication: Mobility status PT Visit Diagnosis: Unsteadiness on feet (R26.81);Other abnormalities of gait and mobility (R26.89);Difficulty in walking, not elsewhere classified (R26.2);Muscle weakness (generalized) (M62.81)     Time: 4239-5320 PT Time Calculation (min) (ACUTE ONLY): 27 min  Charges:  $Gait Training: 8-22 mins $Therapeutic Exercise: 8-22 mins          Felicia Little B. Zaidee Rion, PT, DPT [phone]           03/22/2017, 11:26 AM

## 2017-03-22 NOTE — Care Management Important Message (Signed)
Important Message  Patient Details  Name: Felicia Little MRN: [id number] Date of Birth: 12/27/1945   Medicare Important Message Given:  Yes    Orbie Pyo 03/22/2017, 3:38 PM

## 2017-03-24 DIAGNOSIS — M25561 Pain in right knee: Secondary | ICD-10-CM | POA: Diagnosis not present

## 2017-03-24 DIAGNOSIS — R262 Difficulty in walking, not elsewhere classified: Secondary | ICD-10-CM | POA: Diagnosis not present

## 2017-03-24 DIAGNOSIS — M25661 Stiffness of right knee, not elsewhere classified: Secondary | ICD-10-CM | POA: Diagnosis not present

## 2017-03-24 DIAGNOSIS — Z96651 Presence of right artificial knee joint: Secondary | ICD-10-CM | POA: Diagnosis not present

## 2017-03-29 DIAGNOSIS — M25561 Pain in right knee: Secondary | ICD-10-CM | POA: Diagnosis not present

## 2017-03-29 DIAGNOSIS — Z96651 Presence of right artificial knee joint: Secondary | ICD-10-CM | POA: Diagnosis not present

## 2017-03-29 DIAGNOSIS — R262 Difficulty in walking, not elsewhere classified: Secondary | ICD-10-CM | POA: Diagnosis not present

## 2017-03-29 DIAGNOSIS — M25661 Stiffness of right knee, not elsewhere classified: Secondary | ICD-10-CM | POA: Diagnosis not present

## 2017-03-29 NOTE — Anesthesia Postprocedure Evaluation (Signed)
Anesthesia Post Note  Patient: MAITE BURLISON  Procedure(s) Performed: RIGHT TOTAL KNEE ARTHROPLASTY WITH COMPUTER NAVIGATION (Right Knee)     Patient location during evaluation: PACU Anesthesia Type: Spinal Level of consciousness: oriented and awake and alert Pain management: pain level controlled Vital Signs Assessment: post-procedure vital signs reviewed and stable Respiratory status: spontaneous breathing, respiratory function stable and patient connected to nasal cannula oxygen Cardiovascular status: blood pressure returned to baseline and stable Postop Assessment: no headache, no backache and no apparent nausea or vomiting Anesthetic complications: no    Last Vitals:  Vitals:   03/21/17 2143 03/22/17 0556  BP: (!) 189/78 (!) 166/63  Pulse: 96 90  Resp: 18 18  Temp: 36.8 C 37 C  SpO2: 95% 100%    Last Pain:  Vitals:   03/22/17 0715  TempSrc:   PainSc: 6                  Zaylei Mullane,JAMES TERRILL

## 2017-03-31 DIAGNOSIS — R262 Difficulty in walking, not elsewhere classified: Secondary | ICD-10-CM | POA: Diagnosis not present

## 2017-03-31 DIAGNOSIS — Z96651 Presence of right artificial knee joint: Secondary | ICD-10-CM | POA: Diagnosis not present

## 2017-03-31 DIAGNOSIS — M25661 Stiffness of right knee, not elsewhere classified: Secondary | ICD-10-CM | POA: Diagnosis not present

## 2017-03-31 DIAGNOSIS — M25561 Pain in right knee: Secondary | ICD-10-CM | POA: Diagnosis not present

## 2017-04-03 DIAGNOSIS — Z96651 Presence of right artificial knee joint: Secondary | ICD-10-CM | POA: Diagnosis not present

## 2017-04-03 DIAGNOSIS — M25661 Stiffness of right knee, not elsewhere classified: Secondary | ICD-10-CM | POA: Diagnosis not present

## 2017-04-03 DIAGNOSIS — M25561 Pain in right knee: Secondary | ICD-10-CM | POA: Diagnosis not present

## 2017-04-03 DIAGNOSIS — R262 Difficulty in walking, not elsewhere classified: Secondary | ICD-10-CM | POA: Diagnosis not present

## 2017-04-05 DIAGNOSIS — Z471 Aftercare following joint replacement surgery: Secondary | ICD-10-CM | POA: Diagnosis not present

## 2017-04-05 DIAGNOSIS — Z96651 Presence of right artificial knee joint: Secondary | ICD-10-CM | POA: Diagnosis not present

## 2017-04-10 DIAGNOSIS — M25561 Pain in right knee: Secondary | ICD-10-CM | POA: Diagnosis not present

## 2017-04-10 DIAGNOSIS — Z96651 Presence of right artificial knee joint: Secondary | ICD-10-CM | POA: Diagnosis not present

## 2017-04-10 DIAGNOSIS — M25661 Stiffness of right knee, not elsewhere classified: Secondary | ICD-10-CM | POA: Diagnosis not present

## 2017-04-10 DIAGNOSIS — R262 Difficulty in walking, not elsewhere classified: Secondary | ICD-10-CM | POA: Diagnosis not present

## 2017-04-12 DIAGNOSIS — M25661 Stiffness of right knee, not elsewhere classified: Secondary | ICD-10-CM | POA: Diagnosis not present

## 2017-04-12 DIAGNOSIS — Z96651 Presence of right artificial knee joint: Secondary | ICD-10-CM | POA: Diagnosis not present

## 2017-04-12 DIAGNOSIS — R262 Difficulty in walking, not elsewhere classified: Secondary | ICD-10-CM | POA: Diagnosis not present

## 2017-04-12 DIAGNOSIS — M25561 Pain in right knee: Secondary | ICD-10-CM | POA: Diagnosis not present

## 2017-04-14 DIAGNOSIS — M25661 Stiffness of right knee, not elsewhere classified: Secondary | ICD-10-CM | POA: Diagnosis not present

## 2017-04-14 DIAGNOSIS — Z96651 Presence of right artificial knee joint: Secondary | ICD-10-CM | POA: Diagnosis not present

## 2017-04-14 DIAGNOSIS — M25561 Pain in right knee: Secondary | ICD-10-CM | POA: Diagnosis not present

## 2017-04-14 DIAGNOSIS — R262 Difficulty in walking, not elsewhere classified: Secondary | ICD-10-CM | POA: Diagnosis not present

## 2017-04-19 DIAGNOSIS — R262 Difficulty in walking, not elsewhere classified: Secondary | ICD-10-CM | POA: Diagnosis not present

## 2017-04-19 DIAGNOSIS — M25661 Stiffness of right knee, not elsewhere classified: Secondary | ICD-10-CM | POA: Diagnosis not present

## 2017-04-19 DIAGNOSIS — Z96651 Presence of right artificial knee joint: Secondary | ICD-10-CM | POA: Diagnosis not present

## 2017-04-19 DIAGNOSIS — M25561 Pain in right knee: Secondary | ICD-10-CM | POA: Diagnosis not present

## 2017-04-24 DIAGNOSIS — Z96651 Presence of right artificial knee joint: Secondary | ICD-10-CM | POA: Diagnosis not present

## 2017-04-24 DIAGNOSIS — M25661 Stiffness of right knee, not elsewhere classified: Secondary | ICD-10-CM | POA: Diagnosis not present

## 2017-04-24 DIAGNOSIS — M25561 Pain in right knee: Secondary | ICD-10-CM | POA: Diagnosis not present

## 2017-04-24 DIAGNOSIS — R262 Difficulty in walking, not elsewhere classified: Secondary | ICD-10-CM | POA: Diagnosis not present

## 2017-04-27 DIAGNOSIS — M25561 Pain in right knee: Secondary | ICD-10-CM | POA: Diagnosis not present

## 2017-04-27 DIAGNOSIS — M25661 Stiffness of right knee, not elsewhere classified: Secondary | ICD-10-CM | POA: Diagnosis not present

## 2017-04-27 DIAGNOSIS — Z96651 Presence of right artificial knee joint: Secondary | ICD-10-CM | POA: Diagnosis not present

## 2017-04-27 DIAGNOSIS — R262 Difficulty in walking, not elsewhere classified: Secondary | ICD-10-CM | POA: Diagnosis not present

## 2017-05-01 DIAGNOSIS — Z96651 Presence of right artificial knee joint: Secondary | ICD-10-CM | POA: Diagnosis not present

## 2017-05-01 DIAGNOSIS — R262 Difficulty in walking, not elsewhere classified: Secondary | ICD-10-CM | POA: Diagnosis not present

## 2017-05-01 DIAGNOSIS — M25561 Pain in right knee: Secondary | ICD-10-CM | POA: Diagnosis not present

## 2017-05-01 DIAGNOSIS — M25661 Stiffness of right knee, not elsewhere classified: Secondary | ICD-10-CM | POA: Diagnosis not present

## 2017-05-04 DIAGNOSIS — M25661 Stiffness of right knee, not elsewhere classified: Secondary | ICD-10-CM | POA: Diagnosis not present

## 2017-05-04 DIAGNOSIS — M25561 Pain in right knee: Secondary | ICD-10-CM | POA: Diagnosis not present

## 2017-05-04 DIAGNOSIS — Z96651 Presence of right artificial knee joint: Secondary | ICD-10-CM | POA: Diagnosis not present

## 2017-05-04 DIAGNOSIS — R262 Difficulty in walking, not elsewhere classified: Secondary | ICD-10-CM | POA: Diagnosis not present

## 2017-05-08 DIAGNOSIS — M25661 Stiffness of right knee, not elsewhere classified: Secondary | ICD-10-CM | POA: Diagnosis not present

## 2017-05-08 DIAGNOSIS — Z96651 Presence of right artificial knee joint: Secondary | ICD-10-CM | POA: Diagnosis not present

## 2017-05-08 DIAGNOSIS — M25561 Pain in right knee: Secondary | ICD-10-CM | POA: Diagnosis not present

## 2017-05-08 DIAGNOSIS — R262 Difficulty in walking, not elsewhere classified: Secondary | ICD-10-CM | POA: Diagnosis not present

## 2017-05-09 DIAGNOSIS — Z96651 Presence of right artificial knee joint: Secondary | ICD-10-CM | POA: Diagnosis not present

## 2017-05-09 DIAGNOSIS — Z471 Aftercare following joint replacement surgery: Secondary | ICD-10-CM | POA: Diagnosis not present

## 2017-06-13 ENCOUNTER — Telehealth: Payer: Self-pay | Admitting: Primary Care

## 2017-06-13 DIAGNOSIS — M159 Polyosteoarthritis, unspecified: Secondary | ICD-10-CM

## 2017-06-13 DIAGNOSIS — M150 Primary generalized (osteo)arthritis: Principal | ICD-10-CM

## 2017-06-13 NOTE — Telephone Encounter (Signed)
Copied from [place] [id number]. Topic: Quick Communication - Rx Refill/Question >> Jun 13, 2017  3:17 PM Synthia Innocent wrote: Medication: traMADol-acetaminophen (ULTRACET) 37.5-325 MG per tablet    Has the patient contacted their pharmacy? No, med has been discontinued, would like to start back, patient had knee surgery   (Agent: If no, request that the patient contact the pharmacy for the refill.)   Preferred Pharmacy (with phone number or street name): [facility] on [address]   Agent: Please be advised that RX refills may take up to 3 business days. We ask that you follow-up with your pharmacy.

## 2017-06-14 NOTE — Telephone Encounter (Signed)
Patient is requesting new Rx: Ultracet 37.5- 325 mg  LOV: 12/19/16   PCP: [place]: verified

## 2017-06-14 NOTE — Telephone Encounter (Signed)
Looks like she's getting hydrocodone from her orthopedic surgeron. She cannot take both Ultracet and hydrocodone. She will need to request refills for pain from orthopedics.

## 2017-06-15 MED ORDER — TRAMADOL-ACETAMINOPHEN 37.5-325 MG PO TABS
1.0000 | ORAL_TABLET | Freq: Two times a day (BID) | ORAL | 0 refills | Status: DC | PRN
Start: 2017-06-15 — End: 2017-12-22

## 2017-06-15 NOTE — Telephone Encounter (Signed)
Noted, please remind her to use this sparingly, I'll send in a prescription to her pharmacy. Will closely monitor PMP Aware.

## 2017-06-15 NOTE — Telephone Encounter (Signed)
Spoken to patient and she stated that the orthopedic surgeon had stop giving her hydrocodone. Patient still have knee pain and was told by the orthopedic to go back start taking the Ultracet again. She is suppose to let primary doctor know that she will be needing this. Please advise.

## 2017-06-16 ENCOUNTER — Other Ambulatory Visit: Payer: Self-pay | Admitting: Primary Care

## 2017-06-16 DIAGNOSIS — M159 Polyosteoarthritis, unspecified: Secondary | ICD-10-CM

## 2017-06-16 DIAGNOSIS — M150 Primary generalized (osteo)arthritis: Principal | ICD-10-CM

## 2017-06-16 NOTE — Telephone Encounter (Signed)
Spoken and notified patient of Kate's comments. Patient verbalized understanding. 

## 2017-06-19 ENCOUNTER — Other Ambulatory Visit: Payer: Self-pay | Admitting: Primary Care

## 2017-06-19 DIAGNOSIS — Z1231 Encounter for screening mammogram for malignant neoplasm of breast: Secondary | ICD-10-CM

## 2017-06-30 DIAGNOSIS — Z4789 Encounter for other orthopedic aftercare: Secondary | ICD-10-CM | POA: Diagnosis not present

## 2017-06-30 DIAGNOSIS — M7631 Iliotibial band syndrome, right leg: Secondary | ICD-10-CM | POA: Diagnosis not present

## 2017-07-31 ENCOUNTER — Ambulatory Visit
Admission: RE | Admit: 2017-07-31 | Discharge: 2017-07-31 | Disposition: A | Discharge status: Home / Self Care | Payer: Medicare Other | Source: Ambulatory Visit | Attending: Primary Care | Admitting: Primary Care

## 2017-07-31 DIAGNOSIS — Z1231 Encounter for screening mammogram for malignant neoplasm of breast: Secondary | ICD-10-CM | POA: Diagnosis not present

## 2017-09-25 ENCOUNTER — Other Ambulatory Visit: Payer: Self-pay | Admitting: Primary Care

## 2017-09-25 DIAGNOSIS — E785 Hyperlipidemia, unspecified: Secondary | ICD-10-CM

## 2017-12-08 ENCOUNTER — Other Ambulatory Visit: Payer: Self-pay | Admitting: Primary Care

## 2017-12-08 DIAGNOSIS — D649 Anemia, unspecified: Secondary | ICD-10-CM

## 2017-12-08 DIAGNOSIS — N289 Disorder of kidney and ureter, unspecified: Secondary | ICD-10-CM

## 2017-12-08 DIAGNOSIS — I10 Essential (primary) hypertension: Secondary | ICD-10-CM

## 2017-12-08 DIAGNOSIS — E785 Hyperlipidemia, unspecified: Secondary | ICD-10-CM

## 2017-12-15 ENCOUNTER — Ambulatory Visit: Payer: Medicare Other

## 2017-12-15 ENCOUNTER — Ambulatory Visit (INDEPENDENT_AMBULATORY_CARE_PROVIDER_SITE_OTHER): Payer: Medicare Other

## 2017-12-15 VITALS — BP 140/90 | HR 65 | Temp 98.7°F | Ht 65.5 in | Wt 173.5 lb

## 2017-12-15 DIAGNOSIS — I10 Essential (primary) hypertension: Secondary | ICD-10-CM | POA: Diagnosis not present

## 2017-12-15 DIAGNOSIS — N289 Disorder of kidney and ureter, unspecified: Secondary | ICD-10-CM

## 2017-12-15 DIAGNOSIS — Z0000 Encounter for general adult medical examination without abnormal findings: Secondary | ICD-10-CM

## 2017-12-15 DIAGNOSIS — D649 Anemia, unspecified: Secondary | ICD-10-CM

## 2017-12-15 DIAGNOSIS — E785 Hyperlipidemia, unspecified: Secondary | ICD-10-CM | POA: Diagnosis not present

## 2017-12-15 LAB — LIPID PANEL
CHOLESTEROL: 170 mg/dL (ref 0–200)
HDL: 43.6 mg/dL (ref >39.00)
LDL Cholesterol: 110 mg/dL — ABNORMAL HIGH (ref 0–99)
NONHDL: 126.13
Total CHOL/HDL Ratio: 4
Triglycerides: 81 mg/dL (ref 0.0–149.0)
VLDL: 16.2 mg/dL (ref 0.0–40.0)

## 2017-12-15 LAB — COMPREHENSIVE METABOLIC PANEL
ALBUMIN: 4.2 g/dL (ref 3.5–5.2)
ALK PHOS: 84 U/L (ref 39–117)
ALT: 8 U/L (ref 0–35)
AST: 14 U/L (ref 0–37)
BILIRUBIN TOTAL: 0.6 mg/dL (ref 0.2–1.2)
BUN: 12 mg/dL (ref 6–23)
CO2: 27 mEq/L (ref 19–32)
CREATININE: 1.07 mg/dL (ref 0.40–1.20)
Calcium: 9.6 mg/dL (ref 8.4–10.5)
Chloride: 107 mEq/L (ref 96–112)
GFR: 64.42 mL/min (ref >60.00)
GLUCOSE: 74 mg/dL (ref 70–99)
POTASSIUM: 4.1 meq/L (ref 3.5–5.1)
SODIUM: 141 meq/L (ref 135–145)
TOTAL PROTEIN: 8.1 g/dL (ref 6.0–8.3)

## 2017-12-15 LAB — CBC
HEMATOCRIT: 41.2 % (ref 36.0–46.0)
Hemoglobin: 13.4 g/dL (ref 12.0–15.0)
MCHC: 32.6 g/dL (ref 30.0–36.0)
MCV: 83.3 fl (ref 78.0–100.0)
Platelets: 284 10*3/uL (ref 150.0–400.0)
RBC: 4.94 Mil/uL (ref 3.87–5.11)
RDW: 15.9 % — AB (ref 11.5–15.5)
WBC: 8.8 10*3/uL (ref 4.0–10.5)

## 2017-12-15 NOTE — Patient Instructions (Signed)
Felicia Little , Thank you for taking time to come for your Medicare Wellness Visit. I appreciate your ongoing commitment to your health goals. Please review the following plan we discussed and let me know if I can assist you in the future.   These are the goals we discussed: Goals    . Increase water intake     Starting 12/15/2017, I will continue to drink at least 6-8 glasses of water daily.       This is a list of the screening recommended for you and due dates:  Health Maintenance  Topic Date Due  . Flu Shot  07/04/2018*  . DEXA scan (bone density measurement)  04/04/2019*  . Colon Cancer Screening  12/15/2026*  . Tetanus Vaccine  12/15/2026*  . Pneumonia vaccines (2 of 2 - PPSV23) 12/19/2017  . Mammogram  08/01/2019  .  Hepatitis C: One time screening is recommended by Center for Disease Control  (CDC) for  adults born from [birth year] through 1967.   Completed  *Topic was postponed. The date shown is not the original due date.   Preventive Care for Adults  A healthy lifestyle and preventive care can promote health and wellness. Preventive health guidelines for adults include the following key practices.  . A routine yearly physical is a good way to check with your health care provider about your health and preventive screening. It is a chance to share any concerns and updates on your health and to receive a thorough exam.  . Visit your dentist for a routine exam and preventive care every 6 months. Brush your teeth twice a day and floss once a day. Good oral hygiene prevents tooth decay and gum disease.  . The frequency of eye exams is based on your age, health, family medical history, use  of contact lenses, and other factors. Follow your health care provider's recommendations for frequency of eye exams.  . Eat a healthy diet. Foods like vegetables, fruits, whole grains, low-fat dairy products, and lean protein foods contain the nutrients you need without too many calories. Decrease your  intake of foods high in solid fats, added sugars, and salt. Eat the right amount of calories for you. Get information about a proper diet from your health care provider, if necessary.  . Regular physical exercise is one of the most important things you can do for your health. Most adults should get at least 150 minutes of moderate-intensity exercise (any activity that increases your heart rate and causes you to sweat) each week. In addition, most adults need muscle-strengthening exercises on 2 or more days a week.  Silver Sneakers may be a benefit available to you. To determine eligibility, you may visit the website: www.silversneakers.com or contact program at 8282999440 Mon-Fri between 8AM-8PM.   . Maintain a healthy weight. The body mass index (BMI) is a screening tool to identify possible weight problems. It provides an estimate of body fat based on height and weight. Your health care provider can find your BMI and can help you achieve or maintain a healthy weight.   For adults 20 years and older: ? A BMI below 18.5 is considered underweight. ? A BMI of 18.5 to 24.9 is normal. ? A BMI of 25 to 29.9 is considered overweight. ? A BMI of 30 and above is considered obese.   . Maintain normal blood lipids and cholesterol levels by exercising and minimizing your intake of saturated fat. Eat a balanced diet with plenty of fruit and vegetables. Blood  tests for lipids and cholesterol should begin at age 51 and be repeated every 5 years. If your lipid or cholesterol levels are high, you are over 50, or you are at high risk for heart disease, you may need your cholesterol levels checked more frequently. Ongoing high lipid and cholesterol levels should be treated with medicines if diet and exercise are not working.  . If you smoke, find out from your health care provider how to quit. If you do not use tobacco, please do not start.  . If you choose to drink alcohol, please do not consume more than 2  drinks per day. One drink is considered to be 12 ounces (355 mL) of beer, 5 ounces (148 mL) of wine, or 1.5 ounces (44 mL) of liquor.  . If you are 49-67 years old, ask your health care provider if you should take aspirin to prevent strokes.  . Use sunscreen. Apply sunscreen liberally and repeatedly throughout the day. You should seek shade when your shadow is shorter than you. Protect yourself by wearing long sleeves, pants, a wide-brimmed hat, and sunglasses year round, whenever you are outdoors.  . Once a month, do a whole body skin exam, using a mirror to look at the skin on your back. Tell your health care provider of new moles, moles that have irregular borders, moles that are larger than a pencil eraser, or moles that have changed in shape or color.

## 2017-12-17 NOTE — Progress Notes (Signed)
PCP notes:   Health maintenance:  Flu vaccine - addressed PPSV23 - addressed; pt desires vaccine at CPE  Abnormal screenings:   Depression score: 21 Depression screen [facility] 2/9 12/15/2017 12/14/2016 10/29/2015 09/03/2015 04/23/2015  Decreased Interest 3 3 0 0 0  Down, Depressed, Hopeless 3 1 0 0 0  PHQ - 2 Score 6 4 0 0 0  Altered sleeping 2 1 - - -  Tired, decreased energy 2 1 - - -  Change in appetite 3 0 - - -  Feeling bad or failure about yourself  3 3 - - -  Trouble concentrating 0 1 - - -  Moving slowly or fidgety/restless 3 3 - - -  Suicidal thoughts 2 0 - - -  PHQ-9 Score 21 13 - - -  Difficult doing work/chores Somewhat difficult Somewhat difficult - - -    Patient concerns:   None  Nurse concerns:  None  Next PCP appt:   12/22/17 @ 1500

## 2017-12-17 NOTE — Progress Notes (Signed)
Subjective:   Felicia Little is a 72 y.o. female who presents for Medicare Annual (Subsequent) preventive examination.  Review of Systems:  N/A Cardiac Risk Factors include: advanced age (>62men, >83 women);dyslipidemia;hypertension     Objective:     Vitals: BP 140/90 (BP Location: Right Arm, Patient Position: Sitting, Cuff Size: Normal)   Pulse 65   Temp 98.7 F (37.1 C) (Oral)   Ht 5' 5.5" (1.664 m) Comment: no shoes  Wt 173 lb 8 oz (78.7 kg)   SpO2 99%   BMI 28.43 kg/m   Body mass index is 28.43 kg/m.  Advanced Directives 12/15/2017 03/21/2017 03/14/2017 12/14/2016 10/29/2015 04/23/2015 01/26/2015  Does Patient Have a Medical Advance Directive? No No No No No No No  Would patient like information on creating a medical advance directive? No - Patient declined No - Patient declined No - Patient declined Yes (MAU/Ambulatory/Procedural Areas - Information given) - - No - patient declined information  Pre-existing out of facility DNR order (yellow form or pink MOST form) - - - - - - -    Tobacco Social History   Tobacco Use  Smoking Status Former Smoker  . Packs/day: 0.50  . Years: 38.00  . Pack years: 19.00  . Types: Cigarettes  . Last attempt to quit: 02/01/2017  . Years since quitting: 0.8  Smokeless Tobacco Never Used     Counseling given: No   Clinical Intake:  Pre-visit preparation completed: Yes  Pain : No/denies pain Pain Score: 0-No pain     Nutritional Status: BMI 25 -29 Overweight Nutritional Risks: None Diabetes: No  How often do you need to have someone help you when you read instructions, pamphlets, or other written materials from your doctor or pharmacy?: 1 - Never What is the last grade level you completed in school?: pt lives alone  Interpreter Needed?: No  Comments: pt lives alone Information entered by :: LPinson, LPN  Past Medical History:  Diagnosis Date  . Anxiety   . Arthritis    "all over" (03/21/2017)  . Chronic lower back  pain   . Chronic prescription benzodiazepine use 09/03/2015  . Depression   . OQHUTMLY(650.3)    "maybe once/week" (04/06/2012) - none recently  . High cholesterol   . History of blood transfusion 2007   "related to my 2nd hip OR" (03/21/2017)  . History of kidney stones   . Hx of smoking 10/22/2014  . Hypertension   . Pneumonia ~ 1995; 1996  . Renal insufficiency 09/03/2015   Past Surgical History:  Procedure Laterality Date  . ANTERIOR HIP REVISION Right 01/26/2015   Procedure: REVISION RIGHT TOTAL HIP ARTHROPLASTY ACETABULAR COMPONENTS  ANTERIOR APPROACH;  Surgeon: Rod Can, MD;  Location: [facility];  Service: Orthopedics;  Laterality: Right;  . BACK SURGERY    . CARPAL TUNNEL RELEASE Right 1990's  . JOINT REPLACEMENT     RT HIP  REPLACEMENT  . KNEE ARTHROPLASTY Right 03/20/2017   Procedure: RIGHT TOTAL KNEE ARTHROPLASTY WITH COMPUTER NAVIGATION;  Surgeon: Rod Can, MD;  Location: [facility];  Service: Orthopedics;  Laterality: Right;  Needs RNFA  . KNEE ARTHROSCOPY Right 1990's  . LAPAROSCOPIC CHOLECYSTECTOMY  1990's  . 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 POSTERIOR LUMBAR FUSION  04/06/2012  . TOTAL HIP  ARTHROPLASTY  2005; 2007   "right; left" (03/21/2017)  . VAGINAL HYSTERECTOMY  1979   Family History  Problem Relation Age of Onset  . Heart attack Mother   . Alzheimer's disease Mother   . Hypertension Daughter   . Breast cancer Neg Hx    Social History   Socioeconomic History  . Marital status: Divorced    Spouse name: Not on file  . Number of children: Not on file  . Years of education: Not on file  . Highest education level: Not on file  Occupational History  . Not on file  Social Needs  . Financial resource strain: Not on file  .  Food insecurity:    Worry: Not on file    Inability: Not on file  . Transportation needs:    Medical: Not on file    Non-medical: Not on file  Tobacco Use  . Smoking status: Former Smoker    Packs/day: 0.50    Years: 38.00    Pack years: 19.00    Types: Cigarettes    Last attempt to quit: 02/01/2017    Years since quitting: 0.8  . Smokeless tobacco: Never Used  Substance and Sexual Activity  . Alcohol use: No    Alcohol/week: 0.0 standard drinks    Comment: 03/21/2017  "tried alcohol once; didn't like it; never tried it again"  . Drug use: No  . Sexual activity: Never  Lifestyle  . Physical activity:    Days per week: Not on file    Minutes per session: Not on file  . Stress: Not on file  Relationships  . Social connections:    Talks on phone: Not on file    Gets together: Not on file    Attends religious service: Not on file    Active member of club or organization: Not on file    Attends meetings of clubs or organizations: Not on file    Relationship status: Not on file  Other Topics Concern  . Not on file  Social History Narrative  . Not on file    Outpatient Encounter Medications as of 12/15/2017  Medication Sig  . aspirin 81 MG chewable tablet Chew 1 tablet (81 mg total) by mouth 2 (two) times daily.  Marland Kitchen atenolol (TENORMIN) 50 MG tablet Take 1 tablet (50 mg total) by mouth daily.  Marland Kitchen docusate sodium (COLACE) 100 MG capsule Take 1 capsule (100 mg total) by mouth 2 (two) times daily.  Marland Kitchen gabapentin (NEURONTIN) 300 MG capsule Take 2 capsules (600 mg total) by mouth at bedtime.  Marland Kitchen HYDROcodone-acetaminophen (NORCO/VICODIN) 5-325 MG tablet Take 1-2 tablets by mouth every 6 (six) hours as needed (knee pain).  . nortriptyline (PAMELOR) 75 MG capsule TAKE ONE CAPSULE BY MOUTH ONCE DAILY AT BEDTIME  . ondansetron (ZOFRAN) 4 MG tablet Take 1 tablet (4 mg total) by mouth every 6 (six) hours as needed for nausea.  . pravastatin (PRAVACHOL) 20 MG tablet TAKE 1 TABLET BY MOUTH AT  BEDTIME  . senna (SENOKOT) 8.6 MG TABS tablet Take 2 tablets (17.2 mg total) by mouth at bedtime.  . traMADol-acetaminophen (ULTRACET) 37.5-325 MG tablet Take 1 tablet by mouth 2 (two) times daily as needed for severe pain.  Marland Kitchen triamterene-hydrochlorothiazide (MAXZIDE) 75-50 MG tablet Take 0.5 tablets by mouth daily. (Patient taking differently: Take 0.5 tablets by mouth as needed. )   No facility-administered encounter medications on file as of 12/15/2017.     Activities of Daily Living In your present state  of health, do you have any difficulty performing the following activities: 12/15/2017 03/21/2017  Hearing? N N  Vision? N N  Difficulty concentrating or making decisions? N N  Walking or climbing stairs? N Y  Dressing or bathing? N Y  Doing errands, shopping? N Y  Conservation officer, nature and eating ? N -  Using the Toilet? N -  In the past six months, have you accidently leaked urine? N -  Do you have problems with loss of bowel control? N -  Managing your Medications? N -  Managing your Finances? N -  Housekeeping or managing your Housekeeping? N -  Some recent data might be hidden    Patient Care Team: Pleas Koch, NP as PCP - General (Internal Medicine)    Assessment:   This is a routine wellness examination for Deriyah.   Hearing Screening   125Hz  250Hz  500Hz  1000Hz  2000Hz  3000Hz  4000Hz  6000Hz  8000Hz   Right ear:   40 40 40  40    Left ear:   40 40 40  40    Vision Screening Comments: Vision exam in Fall 2018   Exercise Activities and Dietary recommendations Current Exercise Habits: The patient does not participate in regular exercise at present, Exercise limited by: None identified  Goals    . Increase water intake     Starting 12/15/2017, I will continue to drink at least 6-8 glasses of water daily.       Fall Risk Fall Risk  12/15/2017 12/14/2016 10/29/2015 09/03/2015 04/23/2015  Falls in the past year? No Yes Yes No No  Comment - 3 falls due to tripping - - -    Number falls in past yr: - 2 or more 1 - -  Injury with Fall? - No - - -    Depression Screen PHQ 2/9 Scores 12/15/2017 12/14/2016 10/29/2015 09/03/2015  PHQ - 2 Score 6 4 0 0  PHQ- 9 Score 21 13 - -     Cognitive Function MMSE - Mini Mental State Exam 12/15/2017 12/14/2016  Orientation to time 5 5  Orientation to Place 5 5  Registration 3 3  Attention/ Calculation 0 0  Recall 2 2  Recall-comments unable to recall 1 of 3 words pt was unable to recall 1 of 3 words  Language- name 2 objects 0 0  Language- repeat 1 1  Language- follow 3 step command 3 3  Language- read & follow direction 0 0  Write a sentence 0 0  Copy design 0 0  Total score 19 19     PLEASE NOTE: A Mini-Cog screen was completed. Maximum score is 20. A value of 0 denotes this part of Folstein MMSE was not completed or the patient failed this part of the Mini-Cog screening.   Mini-Cog Screening Orientation to Time - Max 5 pts Orientation to Place - Max 5 pts Registration - Max 3 pts Recall - Max 3 pts Language Repeat - Max 1 pts Language Follow 3 Step Command - Max 3 pts'    Immunization History  Administered Date(s) Administered  . Influenza, High Dose Seasonal PF 01/21/2015  . Influenza,inj,Quad PF,6+ Mos 12/19/2016  . Influenza-Unspecified 03/04/2012  . Pneumococcal Conjugate-13 12/19/2016    Screening Tests Health Maintenance  Topic Date Due  . INFLUENZA VACCINE  07/04/2018 (Originally 11/02/2017)  . DEXA SCAN  04/04/2019 (Originally 09/05/2008)  . COLONOSCOPY  12/15/2026 (Originally 09/05/1993)  . TETANUS/TDAP  12/15/2026 (Originally 09/06/1962)  . PNA vac Low Risk Adult (2 of 2 -  PPSV23) 12/19/2017  . MAMMOGRAM  08/01/2019  . Hepatitis C Screening  Completed       Plan:     I have personally reviewed, addressed, and noted the following in the patient's chart:  A. Medical and social history B. Use of alcohol, tobacco or illicit drugs  C. Current medications and supplements D. Functional ability  and status E.  Nutritional status F.  Physical activity G. Advance directives H. List of other physicians I.  Hospitalizations, surgeries, and ER visits in previous 12 months J.  [place] to include hearing, vision, cognitive, depression L. Referrals and appointments - none  In addition, I have reviewed and discussed with patient certain preventive protocols, quality metrics, and best practice recommendations. A written personalized care plan for preventive services as well as general preventive health recommendations were provided to patient.  See attached scanned questionnaire for additional information.   Signed,   Lindell Noe, MHA, BS, LPN Health Coach

## 2017-12-18 NOTE — Progress Notes (Signed)
I reviewed health advisor's note, was available for consultation, and agree with documentation and plan.  

## 2017-12-22 ENCOUNTER — Ambulatory Visit (INDEPENDENT_AMBULATORY_CARE_PROVIDER_SITE_OTHER): Payer: Medicare Other | Admitting: Primary Care

## 2017-12-22 ENCOUNTER — Encounter: Payer: Self-pay | Admitting: Primary Care

## 2017-12-22 VITALS — BP 150/90 | HR 61 | Temp 98.2°F | Ht 65.5 in | Wt 175.0 lb

## 2017-12-22 DIAGNOSIS — F419 Anxiety disorder, unspecified: Secondary | ICD-10-CM

## 2017-12-22 DIAGNOSIS — I10 Essential (primary) hypertension: Secondary | ICD-10-CM

## 2017-12-22 DIAGNOSIS — N289 Disorder of kidney and ureter, unspecified: Secondary | ICD-10-CM

## 2017-12-22 DIAGNOSIS — M1711 Unilateral primary osteoarthritis, right knee: Secondary | ICD-10-CM | POA: Diagnosis not present

## 2017-12-22 DIAGNOSIS — E2839 Other primary ovarian failure: Secondary | ICD-10-CM

## 2017-12-22 DIAGNOSIS — G8929 Other chronic pain: Secondary | ICD-10-CM

## 2017-12-22 DIAGNOSIS — F32A Depression, unspecified: Secondary | ICD-10-CM

## 2017-12-22 DIAGNOSIS — E785 Hyperlipidemia, unspecified: Secondary | ICD-10-CM | POA: Diagnosis not present

## 2017-12-22 DIAGNOSIS — M1991 Primary osteoarthritis, unspecified site: Secondary | ICD-10-CM

## 2017-12-22 DIAGNOSIS — M150 Primary generalized (osteo)arthritis: Secondary | ICD-10-CM | POA: Diagnosis not present

## 2017-12-22 DIAGNOSIS — Z23 Encounter for immunization: Secondary | ICD-10-CM | POA: Diagnosis not present

## 2017-12-22 DIAGNOSIS — F329 Major depressive disorder, single episode, unspecified: Secondary | ICD-10-CM

## 2017-12-22 DIAGNOSIS — M159 Polyosteoarthritis, unspecified: Secondary | ICD-10-CM

## 2017-12-22 MED ORDER — TRAMADOL-ACETAMINOPHEN 37.5-325 MG PO TABS: ORAL_TABLET | ORAL | 0 refills | Status: DC

## 2017-12-22 MED ORDER — ATENOLOL 50 MG PO TABS: ORAL_TABLET | ORAL | 3 refills | Status: DC

## 2017-12-22 MED ORDER — GABAPENTIN 300 MG PO CAPS: 600.0000 mg | ORAL_CAPSULE | Freq: Every day | ORAL | 3 refills | Status: DC

## 2017-12-22 MED ORDER — NORTRIPTYLINE HCL 75 MG PO CAPS: ORAL_CAPSULE | ORAL | 3 refills | Status: DC

## 2017-12-22 NOTE — Assessment & Plan Note (Signed)
Above goal in the office today, home readings are normal. Will have her continue to monitor BP and report readings at or above 150/90. BMP unremarkable.

## 2017-12-22 NOTE — Assessment & Plan Note (Signed)
S/p right knee replacement in December 2018. Will be seeing her orthopedist soon for follow up.

## 2017-12-22 NOTE — Assessment & Plan Note (Signed)
Renal function WNL on recent labs. Continue to monitor.

## 2017-12-22 NOTE — Assessment & Plan Note (Signed)
Recent lipid panel stable, continue pravastatin.

## 2017-12-22 NOTE — Patient Instructions (Addendum)
Call the [facility] to schedule your bone density scan.   Use the Tramadol sparingly as needed for severe pain. Please call us when you need refills.   Your blood pressure is too high. Monitor your blood pressure at home and call me if it continues to run at or above 150/90.  Start exercising. You should be getting 150 minutes of exercise weekly.  Make sure to eat a healthy diet with vegetables, fruit, whole grains, lean protein.  Ensure you are consuming 64 ounces of water daily.  We will see you next year for your physical or sooner if needed.  It was a pleasure to see you today!

## 2017-12-22 NOTE — Assessment & Plan Note (Signed)
Refills provided for Tramadol. No suspicious activity noted on PMP aware site.

## 2017-12-22 NOTE — Assessment & Plan Note (Signed)
Intermittent anxiety, overall manages on her own. Has refused other treatment in the past as she only wants Benzos. Will not be prescribing benzos.

## 2017-12-22 NOTE — Assessment & Plan Note (Signed)
Chronic to right knee and right hip. No use of Tramadol since April as she has not requested a refill. Refill provided today, discussed to use sparingly as needed.

## 2017-12-22 NOTE — Progress Notes (Signed)
Subjective:    Patient ID: Felicia Little, female    DOB: 29-Oct-1945, 72 y.o.   MRN: [id number]  HPI  Felicia Little is a 72 year old female who presents today for Bowen part 2. She saw our health advisor this week.   1) Essential Hypertension: Currently managed on atenolol 50 mg daily. She checks her BP at her daughter's house which runs 100/80. She denies chest pain, dizziness, shortness of breath.   2) Osteoarthritis: Currently managed on gabapentin 600 mg HS and nortriptyline 75 mg HS. She's not taken her Tramadol since April 2019 as she's not requested a refill. Most of her pain is to the right hip and right knee. S/P right knee replacement in December 2018. She is due to see her orthopedist soon for follow up.   Immunizations: -Influenza: Due -Pneumonia: Completed Prevnar 13, due for pneumovax   Colonoscopy: Declines Dexa:  No recent exam. Mammogram: Completed in April 2019 Hep C Screen: Negative in 2018  BP Readings from Last 3 Encounters:  12/22/17 (!) 150/90  12/15/17 140/90  03/22/17 (!) 166/63     Review of Systems  Constitutional: Negative for unexpected weight change.  HENT: Negative for rhinorrhea.   Respiratory: Negative for cough and shortness of breath.   Cardiovascular: Negative for chest pain.  Gastrointestinal: Negative for constipation and diarrhea.  Genitourinary: Negative for difficulty urinating.  Musculoskeletal: Positive for arthralgias.  Skin: Negative for rash.  Allergic/Immunologic: Negative for environmental allergies.  Neurological: Negative for dizziness, numbness and headaches.  Psychiatric/Behavioral:       Intermittent anxiety       Past Medical History:  Diagnosis Date  . Anxiety   . Arthritis    "all over" (03/21/2017)  . Chronic lower back pain   . Chronic prescription benzodiazepine use 09/03/2015  . Depression   . FOYDXAJO(878.6)    "maybe once/week" (04/06/2012) - none recently  . High cholesterol   . History of blood transfusion  2007   "related to my 2nd hip OR" (03/21/2017)  . History of kidney stones   . Hx of smoking 10/22/2014  . Hypertension   . Pneumonia ~ 1995; 1996  . Renal insufficiency 09/03/2015     Social History   Socioeconomic History  . Marital status: Divorced    Spouse name: Not on file  . Number of children: Not on file  . Years of education: Not on file  . Highest education level: Not on file  Occupational History  . Not on file  Social Needs  . Financial resource strain: Not on file  . Food insecurity:    Worry: Not on file    Inability: Not on file  . Transportation needs:    Medical: Not on file    Non-medical: Not on file  Tobacco Use  . Smoking status: Former Smoker    Packs/day: 0.50    Years: 38.00    Pack years: 19.00    Types: Cigarettes    Last attempt to quit: 02/01/2017    Years since quitting: 0.8  . Smokeless tobacco: Never Used  Substance and Sexual Activity  . Alcohol use: No    Alcohol/week: 0.0 standard drinks    Comment: 03/21/2017  "tried alcohol once; didn't like it; never tried it again"  . Drug use: No  . Sexual activity: Never  Lifestyle  . Physical activity:    Days per week: Not on file    Minutes per session: Not on file  . Stress: Not  on file  Relationships  . Social connections:    Talks on phone: Not on file    Gets together: Not on file    Attends religious service: Not on file    Active member of club or organization: Not on file    Attends meetings of clubs or organizations: Not on file    Relationship status: Not on file  . Intimate partner violence:    Fear of current or ex partner: Not on file    Emotionally abused: Not on file    Physically abused: Not on file    Forced sexual activity: Not on file  Other Topics Concern  . Not on file  Social History Narrative  . Not on file    Past Surgical History:  Procedure Laterality Date  . ANTERIOR HIP REVISION Right 01/26/2015   Procedure: REVISION RIGHT TOTAL HIP ARTHROPLASTY  ACETABULAR COMPONENTS  ANTERIOR APPROACH;  Surgeon: Rod Can, MD;  Location: [facility];  Service: Orthopedics;  Laterality: Right;  . BACK SURGERY    . CARPAL TUNNEL RELEASE Right 1990's  . JOINT REPLACEMENT     RT HIP  REPLACEMENT  . KNEE ARTHROPLASTY Right 03/20/2017   Procedure: RIGHT TOTAL KNEE ARTHROPLASTY WITH COMPUTER NAVIGATION;  Surgeon: Rod Can, MD;  Location: [facility];  Service: Orthopedics;  Laterality: Right;  Needs RNFA  . KNEE ARTHROSCOPY Right 1990's  . LAPAROSCOPIC CHOLECYSTECTOMY  1990's  . 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 POSTERIOR LUMBAR FUSION  04/06/2012  . TOTAL HIP ARTHROPLASTY  2005; 2007   "right; left" (03/21/2017)  . VAGINAL HYSTERECTOMY  1979    Family History  Problem Relation Age of Onset  . Heart attack Mother   . Alzheimer's disease Mother   . Hypertension Daughter   . Breast cancer Neg Hx     Allergies  Allergen Reactions  . Other Swelling    VARIOUS METALS cause internal swelling    Current Outpatient Medications on File Prior to Visit  Medication Sig Dispense Refill  . aspirin 81 MG chewable tablet Chew 1 tablet (81 mg total) by mouth 2 (two) times daily. 60 tablet 1  . pravastatin (PRAVACHOL) 20 MG tablet TAKE 1 TABLET BY MOUTH AT BEDTIME 90 tablet 1   No current facility-administered medications on file prior to visit.     BP (!) 150/90   Pulse 61   Temp 98.2 F (36.8 C) (Oral)   Ht 5' 5.5" (1.664 m)   Wt 175 lb (79.4 kg)   SpO2 98%   BMI 28.68 kg/m    Objective:   Physical Exam  Constitutional: She is oriented to person, place, and time. She appears well-nourished.  HENT:  Mouth/Throat: No oropharyngeal exudate.  Eyes: Pupils are equal, round, and reactive to light. EOM are normal.    Neck: Neck supple. No thyromegaly present.  Cardiovascular: Normal rate and regular rhythm.  Respiratory: Effort normal and breath sounds normal.  GI: Soft. Bowel sounds are normal. There is no tenderness.  Musculoskeletal:       Right hip: She exhibits decreased range of motion and decreased strength. She exhibits no tenderness.  Neurological: She is alert and oriented to person, place, and time.  Skin: Skin is warm and dry.  Psychiatric: She has a normal mood and  affect.           Assessment & Plan:

## 2017-12-29 DIAGNOSIS — Z09 Encounter for follow-up examination after completed treatment for conditions other than malignant neoplasm: Secondary | ICD-10-CM | POA: Diagnosis not present

## 2017-12-29 DIAGNOSIS — Z96651 Presence of right artificial knee joint: Secondary | ICD-10-CM | POA: Diagnosis not present

## 2018-01-23 ENCOUNTER — Other Ambulatory Visit: Payer: Self-pay | Admitting: Primary Care

## 2018-01-23 ENCOUNTER — Ambulatory Visit
Admission: RE | Admit: 2018-01-23 | Discharge: 2018-01-23 | Disposition: A | Discharge status: Home / Self Care | Payer: Medicare Other | Source: Ambulatory Visit | Attending: Primary Care | Admitting: Primary Care

## 2018-01-23 DIAGNOSIS — E2839 Other primary ovarian failure: Secondary | ICD-10-CM

## 2018-01-26 ENCOUNTER — Encounter: Payer: Self-pay | Admitting: *Deleted

## 2018-02-13 ENCOUNTER — Other Ambulatory Visit: Payer: Self-pay

## 2018-02-13 DIAGNOSIS — M159 Polyosteoarthritis, unspecified: Secondary | ICD-10-CM

## 2018-02-13 DIAGNOSIS — M150 Primary generalized (osteo)arthritis: Principal | ICD-10-CM

## 2018-02-13 NOTE — Telephone Encounter (Signed)
It appears that she recently filled a prescription for hydrocodone from her dentist, last fill date of 01/22/2018. Will not refill tramadol at this point as she cannot take both medications.  How often is she taking the tramadol?  Does she need a refill? If she still taking hydrocodone?

## 2018-02-13 NOTE — Telephone Encounter (Signed)
Name of Medication: tramadol apap 37.5-325 mg Name of Pharmacy: [facility] [address] Last Fill or Written Date and Quantity: # 66 on 12/22/17 Last Office Visit and Type: 12/22/17 annual Next Office Visit and Type: none scheduled Last Controlled Substance Agreement Date: 12/09/16 Last UDS:12/09/16

## 2018-02-16 NOTE — Telephone Encounter (Signed)
Message left for patient to return my call.  

## 2018-02-19 MED ORDER — TRAMADOL-ACETAMINOPHEN 37.5-325 MG PO TABS: ORAL_TABLET | ORAL | 0 refills | Status: DC

## 2018-02-19 NOTE — Telephone Encounter (Signed)
Spoken to patient and she stated that she had already finished the hydrocodone, she had 4 teeth pulled out at the time. She stated that she does need a refill. Patient is taking 1 tablet a daily.

## 2018-02-19 NOTE — Telephone Encounter (Signed)
Noted, refill sent to pharmacy. 

## 2018-03-16 ENCOUNTER — Other Ambulatory Visit: Payer: Self-pay | Admitting: Primary Care

## 2018-03-16 DIAGNOSIS — E785 Hyperlipidemia, unspecified: Secondary | ICD-10-CM

## 2018-04-17 DIAGNOSIS — M25512 Pain in left shoulder: Secondary | ICD-10-CM | POA: Diagnosis not present

## 2018-05-24 DIAGNOSIS — Z87891 Personal history of nicotine dependence: Secondary | ICD-10-CM | POA: Diagnosis not present

## 2018-05-24 DIAGNOSIS — G8929 Other chronic pain: Secondary | ICD-10-CM | POA: Diagnosis not present

## 2018-05-24 DIAGNOSIS — N289 Disorder of kidney and ureter, unspecified: Secondary | ICD-10-CM | POA: Diagnosis not present

## 2018-05-24 DIAGNOSIS — J811 Chronic pulmonary edema: Secondary | ICD-10-CM | POA: Diagnosis not present

## 2018-05-24 DIAGNOSIS — R51 Headache: Secondary | ICD-10-CM | POA: Diagnosis not present

## 2018-05-24 DIAGNOSIS — Z7982 Long term (current) use of aspirin: Secondary | ICD-10-CM | POA: Diagnosis not present

## 2018-05-24 DIAGNOSIS — E785 Hyperlipidemia, unspecified: Secondary | ICD-10-CM | POA: Diagnosis not present

## 2018-05-24 DIAGNOSIS — Z79899 Other long term (current) drug therapy: Secondary | ICD-10-CM | POA: Diagnosis not present

## 2018-05-24 DIAGNOSIS — I10 Essential (primary) hypertension: Secondary | ICD-10-CM | POA: Diagnosis not present

## 2018-05-24 DIAGNOSIS — R6883 Chills (without fever): Secondary | ICD-10-CM | POA: Diagnosis not present

## 2018-05-24 DIAGNOSIS — J101 Influenza due to other identified influenza virus with other respiratory manifestations: Secondary | ICD-10-CM | POA: Diagnosis not present

## 2018-05-24 DIAGNOSIS — F419 Anxiety disorder, unspecified: Secondary | ICD-10-CM | POA: Diagnosis not present

## 2018-05-31 DIAGNOSIS — R05 Cough: Secondary | ICD-10-CM | POA: Diagnosis not present

## 2018-05-31 DIAGNOSIS — J22 Unspecified acute lower respiratory infection: Secondary | ICD-10-CM | POA: Diagnosis not present

## 2018-06-22 ENCOUNTER — Other Ambulatory Visit: Payer: Self-pay | Admitting: Primary Care

## 2018-06-22 DIAGNOSIS — Z1231 Encounter for screening mammogram for malignant neoplasm of breast: Secondary | ICD-10-CM

## 2018-09-21 DIAGNOSIS — Z8269 Family history of other diseases of the musculoskeletal system and connective tissue: Secondary | ICD-10-CM | POA: Diagnosis not present

## 2018-09-21 DIAGNOSIS — E034 Atrophy of thyroid (acquired): Secondary | ICD-10-CM | POA: Diagnosis not present

## 2018-09-21 DIAGNOSIS — M1710 Unilateral primary osteoarthritis, unspecified knee: Secondary | ICD-10-CM | POA: Diagnosis not present

## 2018-09-21 DIAGNOSIS — I10 Essential (primary) hypertension: Secondary | ICD-10-CM | POA: Diagnosis not present

## 2018-09-21 DIAGNOSIS — R5381 Other malaise: Secondary | ICD-10-CM | POA: Diagnosis not present

## 2018-09-21 DIAGNOSIS — Z96651 Presence of right artificial knee joint: Secondary | ICD-10-CM | POA: Diagnosis not present

## 2018-09-21 DIAGNOSIS — E7849 Other hyperlipidemia: Secondary | ICD-10-CM | POA: Diagnosis not present

## 2018-09-21 DIAGNOSIS — M1990 Unspecified osteoarthritis, unspecified site: Secondary | ICD-10-CM | POA: Diagnosis not present

## 2018-09-27 DIAGNOSIS — Z96651 Presence of right artificial knee joint: Secondary | ICD-10-CM | POA: Diagnosis not present

## 2018-09-27 DIAGNOSIS — M1990 Unspecified osteoarthritis, unspecified site: Secondary | ICD-10-CM | POA: Diagnosis not present

## 2018-09-27 DIAGNOSIS — M1710 Unilateral primary osteoarthritis, unspecified knee: Secondary | ICD-10-CM | POA: Diagnosis not present

## 2018-09-27 DIAGNOSIS — Z8269 Family history of other diseases of the musculoskeletal system and connective tissue: Secondary | ICD-10-CM | POA: Diagnosis not present

## 2018-10-09 ENCOUNTER — Ambulatory Visit
Admission: RE | Admit: 2018-10-09 | Discharge: 2018-10-09 | Disposition: A | Discharge status: Home / Self Care | Payer: Medicare Other | Source: Ambulatory Visit | Attending: Primary Care | Admitting: Primary Care

## 2018-10-09 ENCOUNTER — Other Ambulatory Visit: Payer: Self-pay

## 2018-10-09 DIAGNOSIS — Z1231 Encounter for screening mammogram for malignant neoplasm of breast: Secondary | ICD-10-CM | POA: Insufficient documentation

## 2018-10-12 ENCOUNTER — Telehealth: Payer: Self-pay | Admitting: Primary Care

## 2018-10-12 NOTE — Telephone Encounter (Signed)
Noted. We are aware.

## 2018-10-12 NOTE — Telephone Encounter (Signed)
Patient called to let Anda Kraft know that she's transferring her care to [facility].

## 2018-10-25 ENCOUNTER — Other Ambulatory Visit: Payer: Self-pay | Admitting: Primary Care

## 2018-10-25 DIAGNOSIS — I10 Essential (primary) hypertension: Secondary | ICD-10-CM

## 2018-10-29 DIAGNOSIS — Z96651 Presence of right artificial knee joint: Secondary | ICD-10-CM | POA: Diagnosis not present

## 2018-11-24 DIAGNOSIS — Z7982 Long term (current) use of aspirin: Secondary | ICD-10-CM | POA: Diagnosis not present

## 2018-11-24 DIAGNOSIS — R102 Pelvic and perineal pain: Secondary | ICD-10-CM | POA: Diagnosis not present

## 2018-11-24 DIAGNOSIS — Z79899 Other long term (current) drug therapy: Secondary | ICD-10-CM | POA: Diagnosis not present

## 2018-11-24 DIAGNOSIS — R1030 Lower abdominal pain, unspecified: Secondary | ICD-10-CM | POA: Diagnosis not present

## 2018-11-24 DIAGNOSIS — E785 Hyperlipidemia, unspecified: Secondary | ICD-10-CM | POA: Diagnosis not present

## 2018-11-24 DIAGNOSIS — Z87891 Personal history of nicotine dependence: Secondary | ICD-10-CM | POA: Diagnosis not present

## 2018-11-24 DIAGNOSIS — I10 Essential (primary) hypertension: Secondary | ICD-10-CM | POA: Diagnosis not present

## 2018-11-24 DIAGNOSIS — G8929 Other chronic pain: Secondary | ICD-10-CM | POA: Diagnosis not present

## 2018-11-24 DIAGNOSIS — Z96643 Presence of artificial hip joint, bilateral: Secondary | ICD-10-CM | POA: Diagnosis not present

## 2018-11-25 DIAGNOSIS — R102 Pelvic and perineal pain: Secondary | ICD-10-CM | POA: Diagnosis not present

## 2018-11-25 DIAGNOSIS — Z96643 Presence of artificial hip joint, bilateral: Secondary | ICD-10-CM | POA: Diagnosis not present

## 2018-11-27 DIAGNOSIS — G5790 Unspecified mononeuropathy of unspecified lower limb: Secondary | ICD-10-CM | POA: Diagnosis not present

## 2018-11-27 DIAGNOSIS — Z96651 Presence of right artificial knee joint: Secondary | ICD-10-CM | POA: Diagnosis not present

## 2018-11-27 DIAGNOSIS — Z8269 Family history of other diseases of the musculoskeletal system and connective tissue: Secondary | ICD-10-CM | POA: Diagnosis not present

## 2018-11-30 DIAGNOSIS — M5416 Radiculopathy, lumbar region: Secondary | ICD-10-CM | POA: Diagnosis not present

## 2018-12-21 DIAGNOSIS — Z1211 Encounter for screening for malignant neoplasm of colon: Secondary | ICD-10-CM | POA: Diagnosis not present

## 2018-12-24 DIAGNOSIS — M5416 Radiculopathy, lumbar region: Secondary | ICD-10-CM | POA: Diagnosis not present

## 2018-12-24 DIAGNOSIS — M48061 Spinal stenosis, lumbar region without neurogenic claudication: Secondary | ICD-10-CM | POA: Diagnosis not present

## 2018-12-31 DIAGNOSIS — M25551 Pain in right hip: Secondary | ICD-10-CM | POA: Diagnosis not present

## 2019-01-15 DIAGNOSIS — Z1159 Encounter for screening for other viral diseases: Secondary | ICD-10-CM | POA: Diagnosis not present

## 2019-01-15 DIAGNOSIS — I10 Essential (primary) hypertension: Secondary | ICD-10-CM | POA: Diagnosis not present

## 2019-03-06 DIAGNOSIS — Z008 Encounter for other general examination: Secondary | ICD-10-CM | POA: Diagnosis not present

## 2019-03-06 DIAGNOSIS — I10 Essential (primary) hypertension: Secondary | ICD-10-CM | POA: Diagnosis not present

## 2019-03-06 DIAGNOSIS — F411 Generalized anxiety disorder: Secondary | ICD-10-CM | POA: Diagnosis not present

## 2019-03-06 DIAGNOSIS — G629 Polyneuropathy, unspecified: Secondary | ICD-10-CM | POA: Diagnosis not present

## 2019-03-06 DIAGNOSIS — E663 Overweight: Secondary | ICD-10-CM | POA: Diagnosis not present

## 2019-03-06 DIAGNOSIS — Z0000 Encounter for general adult medical examination without abnormal findings: Secondary | ICD-10-CM | POA: Diagnosis not present

## 2019-03-06 DIAGNOSIS — Z6827 Body mass index (BMI) 27.0-27.9, adult: Secondary | ICD-10-CM | POA: Diagnosis not present

## 2019-03-06 DIAGNOSIS — Z23 Encounter for immunization: Secondary | ICD-10-CM | POA: Diagnosis not present

## 2019-04-11 DIAGNOSIS — G8929 Other chronic pain: Secondary | ICD-10-CM | POA: Diagnosis not present

## 2019-04-11 DIAGNOSIS — G4700 Insomnia, unspecified: Secondary | ICD-10-CM | POA: Diagnosis not present

## 2019-04-11 DIAGNOSIS — Z008 Encounter for other general examination: Secondary | ICD-10-CM | POA: Diagnosis not present

## 2019-04-11 DIAGNOSIS — I10 Essential (primary) hypertension: Secondary | ICD-10-CM | POA: Diagnosis not present

## 2019-04-11 DIAGNOSIS — M25559 Pain in unspecified hip: Secondary | ICD-10-CM | POA: Diagnosis not present

## 2019-05-02 ENCOUNTER — Other Ambulatory Visit: Payer: Self-pay | Admitting: Family Medicine

## 2019-05-02 ENCOUNTER — Ambulatory Visit
Admission: RE | Admit: 2019-05-02 | Discharge: 2019-05-02 | Disposition: A | Discharge status: Home / Self Care | Payer: Medicare Other | Source: Ambulatory Visit | Attending: Family Medicine | Admitting: Family Medicine

## 2019-05-02 ENCOUNTER — Other Ambulatory Visit: Payer: Self-pay

## 2019-05-02 DIAGNOSIS — M25552 Pain in left hip: Secondary | ICD-10-CM

## 2019-05-02 DIAGNOSIS — Z96642 Presence of left artificial hip joint: Secondary | ICD-10-CM | POA: Diagnosis not present

## 2019-05-02 DIAGNOSIS — Z471 Aftercare following joint replacement surgery: Secondary | ICD-10-CM | POA: Diagnosis not present

## 2019-07-22 DIAGNOSIS — Z20822 Contact with and (suspected) exposure to covid-19: Secondary | ICD-10-CM | POA: Diagnosis not present

## 2019-07-22 DIAGNOSIS — R29704 NIHSS score 4: Secondary | ICD-10-CM | POA: Diagnosis present

## 2019-07-22 DIAGNOSIS — Z7982 Long term (current) use of aspirin: Secondary | ICD-10-CM | POA: Diagnosis not present

## 2019-07-22 DIAGNOSIS — I517 Cardiomegaly: Secondary | ICD-10-CM | POA: Diagnosis present

## 2019-07-22 DIAGNOSIS — Z87891 Personal history of nicotine dependence: Secondary | ICD-10-CM | POA: Diagnosis not present

## 2019-07-22 DIAGNOSIS — E785 Hyperlipidemia, unspecified: Secondary | ICD-10-CM | POA: Diagnosis not present

## 2019-07-22 DIAGNOSIS — M1990 Unspecified osteoarthritis, unspecified site: Secondary | ICD-10-CM | POA: Diagnosis present

## 2019-07-22 DIAGNOSIS — G8194 Hemiplegia, unspecified affecting left nondominant side: Secondary | ICD-10-CM | POA: Diagnosis not present

## 2019-07-22 DIAGNOSIS — F419 Anxiety disorder, unspecified: Secondary | ICD-10-CM | POA: Diagnosis present

## 2019-07-22 DIAGNOSIS — I6521 Occlusion and stenosis of right carotid artery: Secondary | ICD-10-CM | POA: Diagnosis not present

## 2019-07-22 DIAGNOSIS — R29810 Facial weakness: Secondary | ICD-10-CM | POA: Diagnosis not present

## 2019-07-22 DIAGNOSIS — I639 Cerebral infarction, unspecified: Secondary | ICD-10-CM | POA: Diagnosis not present

## 2019-07-22 DIAGNOSIS — G8929 Other chronic pain: Secondary | ICD-10-CM | POA: Diagnosis present

## 2019-07-22 DIAGNOSIS — G4733 Obstructive sleep apnea (adult) (pediatric): Secondary | ICD-10-CM | POA: Diagnosis present

## 2019-07-22 DIAGNOSIS — R4781 Slurred speech: Secondary | ICD-10-CM | POA: Diagnosis not present

## 2019-07-22 DIAGNOSIS — R531 Weakness: Secondary | ICD-10-CM | POA: Diagnosis not present

## 2019-07-22 DIAGNOSIS — R200 Anesthesia of skin: Secondary | ICD-10-CM | POA: Diagnosis not present

## 2019-07-22 DIAGNOSIS — Z96649 Presence of unspecified artificial hip joint: Secondary | ICD-10-CM | POA: Diagnosis present

## 2019-07-22 DIAGNOSIS — I10 Essential (primary) hypertension: Secondary | ICD-10-CM | POA: Diagnosis not present

## 2019-07-24 MED ORDER — MELATONIN 3 MG PO TABS
3.00 | ORAL_TABLET | ORAL | Status: DC
Start: 2019-07-24 — End: 2019-07-24

## 2019-07-24 MED ORDER — CARVEDILOL 25 MG PO TABS
25.00 | ORAL_TABLET | ORAL | Status: DC
Start: 2019-07-24 — End: 2019-07-24

## 2019-07-24 MED ORDER — ASPIRIN 81 MG PO CHEW
81.00 | CHEWABLE_TABLET | ORAL | Status: DC
Start: 2019-07-25 — End: 2019-07-24

## 2019-07-24 MED ORDER — NICOTINE 14 MG/24HR TD PT24
1.00 | MEDICATED_PATCH | TRANSDERMAL | Status: DC
Start: 2019-07-25 — End: 2019-07-24

## 2019-07-24 MED ORDER — ENOXAPARIN SODIUM 40 MG/0.4ML ~~LOC~~ SOLN
40.00 | SUBCUTANEOUS | Status: DC
Start: 2019-07-25 — End: 2019-07-24

## 2019-07-24 MED ORDER — INSULIN REGULAR HUMAN 100 UNIT/ML IJ SOLN
0.00 | INTRAMUSCULAR | Status: DC
Start: 2019-07-24 — End: 2019-07-24

## 2019-07-24 MED ORDER — ACETAMINOPHEN 325 MG PO TABS
650.00 | ORAL_TABLET | ORAL | Status: DC
Start: ? — End: 2019-07-24

## 2019-07-24 MED ORDER — DEXTROSE 50 % IV SOLN
12.50 | INTRAVENOUS | Status: DC
Start: ? — End: 2019-07-24

## 2019-07-24 MED ORDER — ATORVASTATIN CALCIUM 80 MG PO TABS
80.00 | ORAL_TABLET | ORAL | Status: DC
Start: 2019-07-25 — End: 2019-07-24

## 2019-07-24 MED ORDER — GABAPENTIN 300 MG PO CAPS
600.00 | ORAL_CAPSULE | ORAL | Status: DC
Start: 2019-07-24 — End: 2019-07-24

## 2019-07-24 MED ORDER — GENERIC EXTERNAL MEDICATION
75.00 | Status: DC
Start: 2019-07-24 — End: 2019-07-24

## 2019-07-24 MED ORDER — CLOPIDOGREL BISULFATE 75 MG PO TABS
75.00 | ORAL_TABLET | ORAL | Status: DC
Start: 2019-07-25 — End: 2019-07-24

## 2019-07-27 DIAGNOSIS — I129 Hypertensive chronic kidney disease with stage 1 through stage 4 chronic kidney disease, or unspecified chronic kidney disease: Secondary | ICD-10-CM | POA: Diagnosis not present

## 2019-07-27 DIAGNOSIS — G4733 Obstructive sleep apnea (adult) (pediatric): Secondary | ICD-10-CM | POA: Diagnosis not present

## 2019-07-27 DIAGNOSIS — Z87891 Personal history of nicotine dependence: Secondary | ICD-10-CM | POA: Diagnosis not present

## 2019-07-27 DIAGNOSIS — G8929 Other chronic pain: Secondary | ICD-10-CM | POA: Diagnosis not present

## 2019-07-27 DIAGNOSIS — Z9181 History of falling: Secondary | ICD-10-CM | POA: Diagnosis not present

## 2019-07-27 DIAGNOSIS — E785 Hyperlipidemia, unspecified: Secondary | ICD-10-CM | POA: Diagnosis not present

## 2019-07-27 DIAGNOSIS — I69392 Facial weakness following cerebral infarction: Secondary | ICD-10-CM | POA: Diagnosis not present

## 2019-07-27 DIAGNOSIS — I69322 Dysarthria following cerebral infarction: Secondary | ICD-10-CM | POA: Diagnosis not present

## 2019-07-27 DIAGNOSIS — I639 Cerebral infarction, unspecified: Secondary | ICD-10-CM | POA: Diagnosis not present

## 2019-07-27 DIAGNOSIS — I69354 Hemiplegia and hemiparesis following cerebral infarction affecting left non-dominant side: Secondary | ICD-10-CM | POA: Diagnosis not present

## 2019-07-27 DIAGNOSIS — F419 Anxiety disorder, unspecified: Secondary | ICD-10-CM | POA: Diagnosis not present

## 2019-07-27 DIAGNOSIS — N189 Chronic kidney disease, unspecified: Secondary | ICD-10-CM | POA: Diagnosis not present

## 2019-07-30 DIAGNOSIS — I69392 Facial weakness following cerebral infarction: Secondary | ICD-10-CM | POA: Diagnosis not present

## 2019-07-30 DIAGNOSIS — N189 Chronic kidney disease, unspecified: Secondary | ICD-10-CM | POA: Diagnosis not present

## 2019-07-30 DIAGNOSIS — I69322 Dysarthria following cerebral infarction: Secondary | ICD-10-CM | POA: Diagnosis not present

## 2019-07-30 DIAGNOSIS — G4733 Obstructive sleep apnea (adult) (pediatric): Secondary | ICD-10-CM | POA: Diagnosis not present

## 2019-07-30 DIAGNOSIS — I69354 Hemiplegia and hemiparesis following cerebral infarction affecting left non-dominant side: Secondary | ICD-10-CM | POA: Diagnosis not present

## 2019-07-30 DIAGNOSIS — I129 Hypertensive chronic kidney disease with stage 1 through stage 4 chronic kidney disease, or unspecified chronic kidney disease: Secondary | ICD-10-CM | POA: Diagnosis not present

## 2019-07-31 DIAGNOSIS — M7989 Other specified soft tissue disorders: Secondary | ICD-10-CM | POA: Diagnosis not present

## 2019-07-31 DIAGNOSIS — I129 Hypertensive chronic kidney disease with stage 1 through stage 4 chronic kidney disease, or unspecified chronic kidney disease: Secondary | ICD-10-CM | POA: Diagnosis not present

## 2019-07-31 DIAGNOSIS — M79672 Pain in left foot: Secondary | ICD-10-CM | POA: Diagnosis not present

## 2019-07-31 DIAGNOSIS — G4733 Obstructive sleep apnea (adult) (pediatric): Secondary | ICD-10-CM | POA: Diagnosis not present

## 2019-07-31 DIAGNOSIS — M19072 Primary osteoarthritis, left ankle and foot: Secondary | ICD-10-CM | POA: Diagnosis not present

## 2019-07-31 DIAGNOSIS — I69322 Dysarthria following cerebral infarction: Secondary | ICD-10-CM | POA: Diagnosis not present

## 2019-07-31 DIAGNOSIS — N189 Chronic kidney disease, unspecified: Secondary | ICD-10-CM | POA: Diagnosis not present

## 2019-07-31 DIAGNOSIS — I69392 Facial weakness following cerebral infarction: Secondary | ICD-10-CM | POA: Diagnosis not present

## 2019-07-31 DIAGNOSIS — I69354 Hemiplegia and hemiparesis following cerebral infarction affecting left non-dominant side: Secondary | ICD-10-CM | POA: Diagnosis not present

## 2019-08-01 DIAGNOSIS — I69392 Facial weakness following cerebral infarction: Secondary | ICD-10-CM | POA: Diagnosis not present

## 2019-08-01 DIAGNOSIS — I129 Hypertensive chronic kidney disease with stage 1 through stage 4 chronic kidney disease, or unspecified chronic kidney disease: Secondary | ICD-10-CM | POA: Diagnosis not present

## 2019-08-01 DIAGNOSIS — I69322 Dysarthria following cerebral infarction: Secondary | ICD-10-CM | POA: Diagnosis not present

## 2019-08-01 DIAGNOSIS — N189 Chronic kidney disease, unspecified: Secondary | ICD-10-CM | POA: Diagnosis not present

## 2019-08-01 DIAGNOSIS — G4733 Obstructive sleep apnea (adult) (pediatric): Secondary | ICD-10-CM | POA: Diagnosis not present

## 2019-08-01 DIAGNOSIS — I69354 Hemiplegia and hemiparesis following cerebral infarction affecting left non-dominant side: Secondary | ICD-10-CM | POA: Diagnosis not present

## 2019-08-02 DIAGNOSIS — M1990 Unspecified osteoarthritis, unspecified site: Secondary | ICD-10-CM | POA: Diagnosis not present

## 2019-08-02 DIAGNOSIS — Z8673 Personal history of transient ischemic attack (TIA), and cerebral infarction without residual deficits: Secondary | ICD-10-CM | POA: Diagnosis not present

## 2019-08-02 DIAGNOSIS — I10 Essential (primary) hypertension: Secondary | ICD-10-CM | POA: Diagnosis not present

## 2019-08-02 DIAGNOSIS — Z79899 Other long term (current) drug therapy: Secondary | ICD-10-CM | POA: Diagnosis not present

## 2019-08-02 DIAGNOSIS — E785 Hyperlipidemia, unspecified: Secondary | ICD-10-CM | POA: Diagnosis not present

## 2019-08-02 DIAGNOSIS — Z008 Encounter for other general examination: Secondary | ICD-10-CM | POA: Diagnosis not present

## 2019-08-08 DIAGNOSIS — I69322 Dysarthria following cerebral infarction: Secondary | ICD-10-CM | POA: Diagnosis not present

## 2019-08-08 DIAGNOSIS — I129 Hypertensive chronic kidney disease with stage 1 through stage 4 chronic kidney disease, or unspecified chronic kidney disease: Secondary | ICD-10-CM | POA: Diagnosis not present

## 2019-08-08 DIAGNOSIS — I69392 Facial weakness following cerebral infarction: Secondary | ICD-10-CM | POA: Diagnosis not present

## 2019-08-08 DIAGNOSIS — N189 Chronic kidney disease, unspecified: Secondary | ICD-10-CM | POA: Diagnosis not present

## 2019-08-08 DIAGNOSIS — I69354 Hemiplegia and hemiparesis following cerebral infarction affecting left non-dominant side: Secondary | ICD-10-CM | POA: Diagnosis not present

## 2019-08-08 DIAGNOSIS — G4733 Obstructive sleep apnea (adult) (pediatric): Secondary | ICD-10-CM | POA: Diagnosis not present

## 2019-08-09 DIAGNOSIS — I129 Hypertensive chronic kidney disease with stage 1 through stage 4 chronic kidney disease, or unspecified chronic kidney disease: Secondary | ICD-10-CM | POA: Diagnosis not present

## 2019-08-09 DIAGNOSIS — I69354 Hemiplegia and hemiparesis following cerebral infarction affecting left non-dominant side: Secondary | ICD-10-CM | POA: Diagnosis not present

## 2019-08-09 DIAGNOSIS — I69392 Facial weakness following cerebral infarction: Secondary | ICD-10-CM | POA: Diagnosis not present

## 2019-08-09 DIAGNOSIS — G4733 Obstructive sleep apnea (adult) (pediatric): Secondary | ICD-10-CM | POA: Diagnosis not present

## 2019-08-09 DIAGNOSIS — I69322 Dysarthria following cerebral infarction: Secondary | ICD-10-CM | POA: Diagnosis not present

## 2019-08-09 DIAGNOSIS — N189 Chronic kidney disease, unspecified: Secondary | ICD-10-CM | POA: Diagnosis not present

## 2019-08-12 DIAGNOSIS — I69392 Facial weakness following cerebral infarction: Secondary | ICD-10-CM | POA: Diagnosis not present

## 2019-08-12 DIAGNOSIS — I69354 Hemiplegia and hemiparesis following cerebral infarction affecting left non-dominant side: Secondary | ICD-10-CM | POA: Diagnosis not present

## 2019-08-12 DIAGNOSIS — N189 Chronic kidney disease, unspecified: Secondary | ICD-10-CM | POA: Diagnosis not present

## 2019-08-12 DIAGNOSIS — I129 Hypertensive chronic kidney disease with stage 1 through stage 4 chronic kidney disease, or unspecified chronic kidney disease: Secondary | ICD-10-CM | POA: Diagnosis not present

## 2019-08-12 DIAGNOSIS — G4733 Obstructive sleep apnea (adult) (pediatric): Secondary | ICD-10-CM | POA: Diagnosis not present

## 2019-08-12 DIAGNOSIS — I69322 Dysarthria following cerebral infarction: Secondary | ICD-10-CM | POA: Diagnosis not present

## 2019-08-13 DIAGNOSIS — G4733 Obstructive sleep apnea (adult) (pediatric): Secondary | ICD-10-CM | POA: Diagnosis not present

## 2019-08-13 DIAGNOSIS — I129 Hypertensive chronic kidney disease with stage 1 through stage 4 chronic kidney disease, or unspecified chronic kidney disease: Secondary | ICD-10-CM | POA: Diagnosis not present

## 2019-08-13 DIAGNOSIS — I69392 Facial weakness following cerebral infarction: Secondary | ICD-10-CM | POA: Diagnosis not present

## 2019-08-13 DIAGNOSIS — N189 Chronic kidney disease, unspecified: Secondary | ICD-10-CM | POA: Diagnosis not present

## 2019-08-13 DIAGNOSIS — I69354 Hemiplegia and hemiparesis following cerebral infarction affecting left non-dominant side: Secondary | ICD-10-CM | POA: Diagnosis not present

## 2019-08-13 DIAGNOSIS — I69322 Dysarthria following cerebral infarction: Secondary | ICD-10-CM | POA: Diagnosis not present

## 2019-08-14 DIAGNOSIS — G8929 Other chronic pain: Secondary | ICD-10-CM | POA: Diagnosis not present

## 2019-08-14 DIAGNOSIS — Z7902 Long term (current) use of antithrombotics/antiplatelets: Secondary | ICD-10-CM | POA: Diagnosis not present

## 2019-08-14 DIAGNOSIS — I639 Cerebral infarction, unspecified: Secondary | ICD-10-CM | POA: Diagnosis not present

## 2019-08-14 DIAGNOSIS — E785 Hyperlipidemia, unspecified: Secondary | ICD-10-CM | POA: Diagnosis not present

## 2019-08-14 DIAGNOSIS — Z7982 Long term (current) use of aspirin: Secondary | ICD-10-CM | POA: Diagnosis not present

## 2019-08-14 DIAGNOSIS — Z96649 Presence of unspecified artificial hip joint: Secondary | ICD-10-CM | POA: Diagnosis not present

## 2019-08-14 DIAGNOSIS — I10 Essential (primary) hypertension: Secondary | ICD-10-CM | POA: Diagnosis not present

## 2019-08-14 DIAGNOSIS — F17210 Nicotine dependence, cigarettes, uncomplicated: Secondary | ICD-10-CM | POA: Diagnosis not present

## 2019-08-19 DIAGNOSIS — N189 Chronic kidney disease, unspecified: Secondary | ICD-10-CM | POA: Diagnosis not present

## 2019-08-19 DIAGNOSIS — I69392 Facial weakness following cerebral infarction: Secondary | ICD-10-CM | POA: Diagnosis not present

## 2019-08-19 DIAGNOSIS — I129 Hypertensive chronic kidney disease with stage 1 through stage 4 chronic kidney disease, or unspecified chronic kidney disease: Secondary | ICD-10-CM | POA: Diagnosis not present

## 2019-08-19 DIAGNOSIS — G4733 Obstructive sleep apnea (adult) (pediatric): Secondary | ICD-10-CM | POA: Diagnosis not present

## 2019-08-19 DIAGNOSIS — I69322 Dysarthria following cerebral infarction: Secondary | ICD-10-CM | POA: Diagnosis not present

## 2019-08-19 DIAGNOSIS — I69354 Hemiplegia and hemiparesis following cerebral infarction affecting left non-dominant side: Secondary | ICD-10-CM | POA: Diagnosis not present

## 2019-08-21 DIAGNOSIS — I639 Cerebral infarction, unspecified: Secondary | ICD-10-CM | POA: Diagnosis not present

## 2019-08-22 ENCOUNTER — Other Ambulatory Visit: Payer: Self-pay | Admitting: Acute Care

## 2019-08-22 DIAGNOSIS — I639 Cerebral infarction, unspecified: Secondary | ICD-10-CM

## 2019-08-23 DIAGNOSIS — I69392 Facial weakness following cerebral infarction: Secondary | ICD-10-CM | POA: Diagnosis not present

## 2019-08-23 DIAGNOSIS — I69322 Dysarthria following cerebral infarction: Secondary | ICD-10-CM | POA: Diagnosis not present

## 2019-08-23 DIAGNOSIS — I69354 Hemiplegia and hemiparesis following cerebral infarction affecting left non-dominant side: Secondary | ICD-10-CM | POA: Diagnosis not present

## 2019-08-23 DIAGNOSIS — I129 Hypertensive chronic kidney disease with stage 1 through stage 4 chronic kidney disease, or unspecified chronic kidney disease: Secondary | ICD-10-CM | POA: Diagnosis not present

## 2019-08-23 DIAGNOSIS — G4733 Obstructive sleep apnea (adult) (pediatric): Secondary | ICD-10-CM | POA: Diagnosis not present

## 2019-08-23 DIAGNOSIS — N189 Chronic kidney disease, unspecified: Secondary | ICD-10-CM | POA: Diagnosis not present

## 2019-08-26 DIAGNOSIS — I639 Cerebral infarction, unspecified: Secondary | ICD-10-CM | POA: Diagnosis not present

## 2019-08-26 DIAGNOSIS — N189 Chronic kidney disease, unspecified: Secondary | ICD-10-CM | POA: Diagnosis not present

## 2019-08-26 DIAGNOSIS — I129 Hypertensive chronic kidney disease with stage 1 through stage 4 chronic kidney disease, or unspecified chronic kidney disease: Secondary | ICD-10-CM | POA: Diagnosis not present

## 2019-08-26 DIAGNOSIS — Z9181 History of falling: Secondary | ICD-10-CM | POA: Diagnosis not present

## 2019-08-26 DIAGNOSIS — E785 Hyperlipidemia, unspecified: Secondary | ICD-10-CM | POA: Diagnosis not present

## 2019-08-26 DIAGNOSIS — I69392 Facial weakness following cerebral infarction: Secondary | ICD-10-CM | POA: Diagnosis not present

## 2019-08-26 DIAGNOSIS — I69322 Dysarthria following cerebral infarction: Secondary | ICD-10-CM | POA: Diagnosis not present

## 2019-08-26 DIAGNOSIS — G4733 Obstructive sleep apnea (adult) (pediatric): Secondary | ICD-10-CM | POA: Diagnosis not present

## 2019-08-26 DIAGNOSIS — Z87891 Personal history of nicotine dependence: Secondary | ICD-10-CM | POA: Diagnosis not present

## 2019-08-26 DIAGNOSIS — F419 Anxiety disorder, unspecified: Secondary | ICD-10-CM | POA: Diagnosis not present

## 2019-08-26 DIAGNOSIS — G8929 Other chronic pain: Secondary | ICD-10-CM | POA: Diagnosis not present

## 2019-08-26 DIAGNOSIS — I69354 Hemiplegia and hemiparesis following cerebral infarction affecting left non-dominant side: Secondary | ICD-10-CM | POA: Diagnosis not present

## 2019-08-27 DIAGNOSIS — G4733 Obstructive sleep apnea (adult) (pediatric): Secondary | ICD-10-CM | POA: Diagnosis not present

## 2019-08-27 DIAGNOSIS — I129 Hypertensive chronic kidney disease with stage 1 through stage 4 chronic kidney disease, or unspecified chronic kidney disease: Secondary | ICD-10-CM | POA: Diagnosis not present

## 2019-08-27 DIAGNOSIS — N189 Chronic kidney disease, unspecified: Secondary | ICD-10-CM | POA: Diagnosis not present

## 2019-08-27 DIAGNOSIS — I69322 Dysarthria following cerebral infarction: Secondary | ICD-10-CM | POA: Diagnosis not present

## 2019-08-27 DIAGNOSIS — I69354 Hemiplegia and hemiparesis following cerebral infarction affecting left non-dominant side: Secondary | ICD-10-CM | POA: Diagnosis not present

## 2019-08-27 DIAGNOSIS — I69392 Facial weakness following cerebral infarction: Secondary | ICD-10-CM | POA: Diagnosis not present

## 2019-09-03 DIAGNOSIS — I69322 Dysarthria following cerebral infarction: Secondary | ICD-10-CM | POA: Diagnosis not present

## 2019-09-03 DIAGNOSIS — I69392 Facial weakness following cerebral infarction: Secondary | ICD-10-CM | POA: Diagnosis not present

## 2019-09-03 DIAGNOSIS — N189 Chronic kidney disease, unspecified: Secondary | ICD-10-CM | POA: Diagnosis not present

## 2019-09-03 DIAGNOSIS — I69354 Hemiplegia and hemiparesis following cerebral infarction affecting left non-dominant side: Secondary | ICD-10-CM | POA: Diagnosis not present

## 2019-09-03 DIAGNOSIS — G4733 Obstructive sleep apnea (adult) (pediatric): Secondary | ICD-10-CM | POA: Diagnosis not present

## 2019-09-03 DIAGNOSIS — I129 Hypertensive chronic kidney disease with stage 1 through stage 4 chronic kidney disease, or unspecified chronic kidney disease: Secondary | ICD-10-CM | POA: Diagnosis not present

## 2019-09-04 ENCOUNTER — Other Ambulatory Visit: Payer: Self-pay | Admitting: Family Medicine

## 2019-09-04 DIAGNOSIS — Z1231 Encounter for screening mammogram for malignant neoplasm of breast: Secondary | ICD-10-CM

## 2019-09-09 ENCOUNTER — Other Ambulatory Visit: Payer: Self-pay

## 2019-09-09 ENCOUNTER — Ambulatory Visit
Admission: RE | Admit: 2019-09-09 | Discharge: 2019-09-09 | Disposition: A | Discharge status: Home / Self Care | Payer: Medicare Other | Source: Ambulatory Visit | Attending: Acute Care | Admitting: Acute Care

## 2019-09-09 DIAGNOSIS — R531 Weakness: Secondary | ICD-10-CM | POA: Diagnosis not present

## 2019-09-09 DIAGNOSIS — R4781 Slurred speech: Secondary | ICD-10-CM | POA: Diagnosis not present

## 2019-09-09 DIAGNOSIS — I639 Cerebral infarction, unspecified: Secondary | ICD-10-CM | POA: Diagnosis not present

## 2019-09-10 DIAGNOSIS — I69392 Facial weakness following cerebral infarction: Secondary | ICD-10-CM | POA: Diagnosis not present

## 2019-09-10 DIAGNOSIS — G4733 Obstructive sleep apnea (adult) (pediatric): Secondary | ICD-10-CM | POA: Diagnosis not present

## 2019-09-10 DIAGNOSIS — I69354 Hemiplegia and hemiparesis following cerebral infarction affecting left non-dominant side: Secondary | ICD-10-CM | POA: Diagnosis not present

## 2019-09-10 DIAGNOSIS — I129 Hypertensive chronic kidney disease with stage 1 through stage 4 chronic kidney disease, or unspecified chronic kidney disease: Secondary | ICD-10-CM | POA: Diagnosis not present

## 2019-09-10 DIAGNOSIS — I69322 Dysarthria following cerebral infarction: Secondary | ICD-10-CM | POA: Diagnosis not present

## 2019-09-10 DIAGNOSIS — N189 Chronic kidney disease, unspecified: Secondary | ICD-10-CM | POA: Diagnosis not present

## 2019-09-11 DIAGNOSIS — I639 Cerebral infarction, unspecified: Secondary | ICD-10-CM | POA: Diagnosis not present

## 2019-09-11 DIAGNOSIS — I6789 Other cerebrovascular disease: Secondary | ICD-10-CM | POA: Diagnosis not present

## 2019-09-17 DIAGNOSIS — I69322 Dysarthria following cerebral infarction: Secondary | ICD-10-CM | POA: Diagnosis not present

## 2019-09-17 DIAGNOSIS — I129 Hypertensive chronic kidney disease with stage 1 through stage 4 chronic kidney disease, or unspecified chronic kidney disease: Secondary | ICD-10-CM | POA: Diagnosis not present

## 2019-09-17 DIAGNOSIS — N189 Chronic kidney disease, unspecified: Secondary | ICD-10-CM | POA: Diagnosis not present

## 2019-09-17 DIAGNOSIS — G4733 Obstructive sleep apnea (adult) (pediatric): Secondary | ICD-10-CM | POA: Diagnosis not present

## 2019-09-17 DIAGNOSIS — I69392 Facial weakness following cerebral infarction: Secondary | ICD-10-CM | POA: Diagnosis not present

## 2019-09-17 DIAGNOSIS — I69354 Hemiplegia and hemiparesis following cerebral infarction affecting left non-dominant side: Secondary | ICD-10-CM | POA: Diagnosis not present

## 2019-10-10 ENCOUNTER — Ambulatory Visit
Admission: RE | Admit: 2019-10-10 | Discharge: 2019-10-10 | Disposition: A | Discharge status: Home / Self Care | Payer: Medicare Other | Source: Ambulatory Visit | Attending: Primary Care | Admitting: Primary Care

## 2019-10-10 DIAGNOSIS — Z1231 Encounter for screening mammogram for malignant neoplasm of breast: Secondary | ICD-10-CM | POA: Diagnosis not present

## 2019-10-17 DIAGNOSIS — F322 Major depressive disorder, single episode, severe without psychotic features: Secondary | ICD-10-CM | POA: Diagnosis not present

## 2019-10-17 DIAGNOSIS — I10 Essential (primary) hypertension: Secondary | ICD-10-CM | POA: Diagnosis not present

## 2019-10-17 DIAGNOSIS — Z8673 Personal history of transient ischemic attack (TIA), and cerebral infarction without residual deficits: Secondary | ICD-10-CM | POA: Diagnosis not present

## 2019-10-17 DIAGNOSIS — G629 Polyneuropathy, unspecified: Secondary | ICD-10-CM | POA: Diagnosis not present

## 2019-10-17 DIAGNOSIS — Z79899 Other long term (current) drug therapy: Secondary | ICD-10-CM | POA: Diagnosis not present

## 2019-10-17 DIAGNOSIS — M1990 Unspecified osteoarthritis, unspecified site: Secondary | ICD-10-CM | POA: Diagnosis not present

## 2019-10-17 DIAGNOSIS — E663 Overweight: Secondary | ICD-10-CM | POA: Diagnosis not present

## 2019-10-17 DIAGNOSIS — I70209 Unspecified atherosclerosis of native arteries of extremities, unspecified extremity: Secondary | ICD-10-CM | POA: Diagnosis not present

## 2019-10-17 DIAGNOSIS — Z6827 Body mass index (BMI) 27.0-27.9, adult: Secondary | ICD-10-CM | POA: Diagnosis not present

## 2019-10-17 DIAGNOSIS — Z008 Encounter for other general examination: Secondary | ICD-10-CM | POA: Diagnosis not present

## 2019-10-17 DIAGNOSIS — I69352 Hemiplegia and hemiparesis following cerebral infarction affecting left dominant side: Secondary | ICD-10-CM | POA: Diagnosis not present

## 2019-10-17 DIAGNOSIS — G4700 Insomnia, unspecified: Secondary | ICD-10-CM | POA: Diagnosis not present

## 2019-10-23 DIAGNOSIS — E781 Pure hyperglyceridemia: Secondary | ICD-10-CM | POA: Diagnosis not present

## 2019-10-23 DIAGNOSIS — I639 Cerebral infarction, unspecified: Secondary | ICD-10-CM | POA: Diagnosis not present

## 2019-10-23 DIAGNOSIS — I10 Essential (primary) hypertension: Secondary | ICD-10-CM | POA: Diagnosis not present

## 2019-10-27 DIAGNOSIS — I639 Cerebral infarction, unspecified: Secondary | ICD-10-CM | POA: Diagnosis not present

## 2019-11-14 DIAGNOSIS — Z6827 Body mass index (BMI) 27.0-27.9, adult: Secondary | ICD-10-CM | POA: Diagnosis not present

## 2019-11-14 DIAGNOSIS — Z0000 Encounter for general adult medical examination without abnormal findings: Secondary | ICD-10-CM | POA: Diagnosis not present

## 2019-11-14 DIAGNOSIS — M1990 Unspecified osteoarthritis, unspecified site: Secondary | ICD-10-CM | POA: Diagnosis not present

## 2019-11-14 DIAGNOSIS — Z0001 Encounter for general adult medical examination with abnormal findings: Secondary | ICD-10-CM | POA: Diagnosis not present

## 2019-11-14 DIAGNOSIS — Z8673 Personal history of transient ischemic attack (TIA), and cerebral infarction without residual deficits: Secondary | ICD-10-CM | POA: Diagnosis not present

## 2019-11-14 DIAGNOSIS — I69352 Hemiplegia and hemiparesis following cerebral infarction affecting left dominant side: Secondary | ICD-10-CM | POA: Diagnosis not present

## 2019-11-14 DIAGNOSIS — E663 Overweight: Secondary | ICD-10-CM | POA: Diagnosis not present

## 2019-11-14 DIAGNOSIS — N1832 Chronic kidney disease, stage 3b: Secondary | ICD-10-CM | POA: Diagnosis not present

## 2019-11-14 DIAGNOSIS — F0150 Vascular dementia without behavioral disturbance: Secondary | ICD-10-CM | POA: Diagnosis not present

## 2019-11-14 DIAGNOSIS — Z008 Encounter for other general examination: Secondary | ICD-10-CM | POA: Diagnosis not present

## 2019-11-14 DIAGNOSIS — R1310 Dysphagia, unspecified: Secondary | ICD-10-CM | POA: Diagnosis not present

## 2019-11-14 DIAGNOSIS — I129 Hypertensive chronic kidney disease with stage 1 through stage 4 chronic kidney disease, or unspecified chronic kidney disease: Secondary | ICD-10-CM | POA: Diagnosis not present

## 2020-01-15 ENCOUNTER — Other Ambulatory Visit (HOSPITAL_COMMUNITY): Payer: Self-pay | Admitting: Nephrology

## 2020-01-15 ENCOUNTER — Other Ambulatory Visit: Payer: Self-pay | Admitting: Nephrology

## 2020-01-15 DIAGNOSIS — N179 Acute kidney failure, unspecified: Secondary | ICD-10-CM

## 2020-01-27 ENCOUNTER — Ambulatory Visit
Admission: RE | Admit: 2020-01-27 | Discharge: 2020-01-27 | Disposition: A | Discharge status: Home / Self Care | Payer: Medicare Other | Source: Ambulatory Visit | Attending: Nephrology | Admitting: Nephrology

## 2020-01-27 ENCOUNTER — Other Ambulatory Visit: Payer: Self-pay

## 2020-01-27 DIAGNOSIS — N179 Acute kidney failure, unspecified: Secondary | ICD-10-CM | POA: Insufficient documentation

## 2020-02-11 ENCOUNTER — Ambulatory Visit (INDEPENDENT_AMBULATORY_CARE_PROVIDER_SITE_OTHER): Payer: Medicare Other | Admitting: Urology

## 2020-02-11 ENCOUNTER — Other Ambulatory Visit: Payer: Self-pay

## 2020-02-11 VITALS — BP 144/84 | HR 69 | Ht 65.0 in | Wt 170.0 lb

## 2020-02-11 DIAGNOSIS — N289 Disorder of kidney and ureter, unspecified: Secondary | ICD-10-CM

## 2020-02-11 DIAGNOSIS — N3281 Overactive bladder: Secondary | ICD-10-CM

## 2020-02-11 DIAGNOSIS — Z87442 Personal history of urinary calculi: Secondary | ICD-10-CM

## 2020-02-11 DIAGNOSIS — N1330 Unspecified hydronephrosis: Secondary | ICD-10-CM | POA: Diagnosis not present

## 2020-02-11 LAB — URINALYSIS, COMPLETE: Scan Result: 42

## 2020-02-11 NOTE — Progress Notes (Signed)
02/11/2020 2:18 PM   Felicia Little 08-06-1945 [id number]  Referring provider: Murlean Iba, MD [address] [place],  [place] [phone]  Chief Complaint  Patient presents with  . Hydronephrosis    HPI: 74 year old female referred for further evaluation of acute kidney injury, hydronephrosis.  She was initially referred to nephrology for elevated creatinine.  Had risen to 1.35 on July 2021.  Repeat creatinine up to 1.41 at the time of evaluation in October.  Her previous baseline was 0.98 on July 24, 2019.  As part of the work-up, she had a urinalysis that showed nitrates and leukocytes.  It does not appear a culture was sent and I do not believe that she was treated for this infection.  She does not recall taking antibiotics  She underwent a renal ultrasound which showed mild right hydronephrosis.  Additionally, there was concern for possible 1.6 x 1.2 x 1.5 left renal cortical mass.  She has no previous cross-sectional imaging for review.  She denies any flank pain.    Although her chart mentions that she has history of kidney stones, she reports that these are in fact gallstones.  She denies a personal history of kidney stones.  She is a former smoker, quit in 2016.  She does report today that over the past year or so, she is had increased urinary urgency frequency primarily at nighttime.  She gets up and voids every 2 hours.  She also has episodes of urinary incontinence in the nighttime.  This started about 6 months prior to her stroke but has been worsening.  Her primary care doctor told her to cut back on fluids before nighttime but she is a hard time with this because her medications "dry her out".  She reports that she was recently started on a new medication for this a few days ago that she just picked up from her pharmacy.  She cannot member the name of the medication.  She seen some slight improvement but is only taken it for a few days.    PMH: Past  Medical History:  Diagnosis Date  . Anxiety   . Arthritis    "all over" (03/21/2017)  . Chronic lower back pain   . Chronic prescription benzodiazepine use 09/03/2015  . Depression   . YQIHKVQQ(595.6)    "maybe once/week" (04/06/2012) - none recently  . High cholesterol   . History of blood transfusion 2007   "related to my 2nd hip OR" (03/21/2017)  . History of kidney stones   . Hx of smoking 10/22/2014  . Hypertension   . Pneumonia ~ 1995; 1996  . Renal insufficiency 09/03/2015    Surgical History: Past Surgical History:  Procedure Laterality Date  . ANTERIOR HIP REVISION Right 01/26/2015   Procedure: REVISION RIGHT TOTAL HIP ARTHROPLASTY ACETABULAR COMPONENTS  ANTERIOR APPROACH;  Surgeon: Rod Can, MD;  Location: [facility];  Service: Orthopedics;  Laterality: Right;  . BACK SURGERY    . CARPAL TUNNEL RELEASE Right 1990's  . JOINT REPLACEMENT     RT HIP  REPLACEMENT  . KNEE ARTHROPLASTY Right 03/20/2017   Procedure: RIGHT TOTAL KNEE ARTHROPLASTY WITH COMPUTER NAVIGATION;  Surgeon: Rod Can, MD;  Location: [facility];  Service: Orthopedics;  Laterality: Right;  Needs RNFA  . KNEE ARTHROSCOPY Right 1990's  . LAPAROSCOPIC CHOLECYSTECTOMY  1990's  . 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 POSTERIOR LUMBAR FUSION  04/06/2012  . TOTAL HIP ARTHROPLASTY  2005; 2007   "right; left" (03/21/2017)  . VAGINAL HYSTERECTOMY  1979    Home Medications:  Allergies as of 02/11/2020      Reactions   Other Swelling   VARIOUS METALS cause internal swelling      Medication List       Accurate as of February 11, 2020  2:18 PM. If you have any questions, ask your nurse or doctor.        STOP taking these medications   traMADol-acetaminophen 37.5-325 MG  tablet Commonly known as: Ultracet Stopped by: Hollice Espy, MD     TAKE these medications   amLODipine 2.5 MG tablet Commonly known as: NORVASC Take 2.5 mg by mouth daily.   aspirin 81 MG chewable tablet Chew 1 tablet (81 mg total) by mouth 2 (two) times daily.   atenolol 50 MG tablet Commonly known as: TENORMIN Take 1 tablet by mouth by moth once daily for blood pressure.   atorvastatin 80 MG tablet Commonly known as: LIPITOR Take by mouth.   carvedilol 25 MG tablet Commonly known as: COREG Take by mouth.   clopidogrel 75 MG tablet Commonly known as: PLAVIX Take by mouth.   gabapentin 300 MG capsule Commonly known as: NEURONTIN Take 2 capsules (600 mg total) by mouth at bedtime.   nortriptyline 75 MG capsule Commonly known as: PAMELOR TAKE ONE CAPSULE BY MOUTH ONCE DAILY AT BEDTIME   pravastatin 20 MG tablet Commonly known as: PRAVACHOL TAKE 1 TABLET BY MOUTH AT BEDTIME       Allergies:  Allergies  Allergen Reactions  . Other Swelling    VARIOUS METALS cause internal swelling    Family History: Family History  Problem Relation Age of Onset  . Heart attack Mother   . Alzheimer's disease Mother   . Hypertension Daughter   . Breast cancer Neg Hx     Social History:  reports that she quit smoking about 3 years ago. Her smoking use included cigarettes. She has a 19.00 pack-year smoking history. She has never used smokeless tobacco. She reports that she does not drink alcohol and does not use drugs.   Physical Exam: BP (!) 144/84   Pulse 69   Ht 5\' 5"  (1.651 m)   Wt 170 lb (77.1 kg)   BMI 28.29 kg/m   Constitutional:  Alert and oriented, No acute distress.  Pleasant, accompanied by daughter today. HEENT: [place] AT, moist mucus membranes.  Trachea midline, no masses. Cardiovascular: No clubbing, cyanosis, or edema. Respiratory: Normal respiratory effort, no increased work of breathing. Skin: No rashes, bruises or suspicious lesions. Neurologic:  Grossly intact, no focal deficits, moving all 4 extremities. Psychiatric: Normal mood and affect.  Urinalysis Pending  Pertinent Imaging:  US RENAL  Narrative CLINICAL DATA:  Acute kidney injury.  EXAM: RENAL / URINARY TRACT ULTRASOUND COMPLETE  COMPARISON:  None.  FINDINGS: Right Kidney:  Renal measurements: 10.2 x 4.7 x 5.5 cm = volume: 136 mL. Simple 3.9 x 2.3 x 2.8 cm lower right renal cyst. Mild right hydronephrosis most prominent in the upper renal collecting system. Mildly echogenic right renal parenchyma, normal thickness.  Left Kidney:  Renal measurements: 10.4 x 5.5 x 4.7 cm = volume: 141 mL. Suggestion of a hyperechoic 1.6 x 1.2 x 1.5 cm upper left renal cortical mass. Mildly echogenic left renal parenchyma, normal  thickness. No left hydronephrosis.  Bladder:  Prevoid bladder volume 73 cc. Small postvoid bladder residual (28 cc). No ureteral jets seen in the bladder. No definite bladder wall thickening or focal bladder lesions.  Other:  None.  IMPRESSION: 1. Mild right hydronephrosis, most prominent in the upper right renal collecting system. Right urinary tract obstruction cannot be excluded. Further evaluation with hematuria protocol CT abdomen/pelvis without and with IV contrast may be considered if/when renal function is restored. Otherwise, unenhanced CT abdomen/pelvis could be considered to exclude obstructing stone disease. 2. No left hydronephrosis. 3. Indeterminate hyperechoic 1.6 cm upper left renal cortical mass. Suggest MRI abdomen without and with IV contrast for further characterization. 4. Echogenic kidneys, compatible with the reported history of nonspecific acute renal parenchymal disease. 5. Small postvoid bladder residual (28 cc). Otherwise normal bladder.   Electronically Signed By: Ilona Sorrel M.D. On: 01/29/2020 16:56  Renal ultrasound personally reviewed.  Agree with radiologic interpretation.  Assessment & Plan:     1. Hydronephrosis, unspecified hydronephrosis type Incidental right hydronephrosis in the setting of renal insufficiency  Etiology of this is unclear at this point, differential diagnosis was reviewed with the patient's including stones, ureteral catheters, congenital diagnoses in the absence of previous imaging amongst others.  Other than renal dysfunction, she has no other symptoms.  Ideally, would recommend CT urogram for the most comprehensive imaging modality which would address this diagnosis as well as left renal mass characterization.  We will plan to repeat BMP today.  If her creatinine is stable or lower, we can plan for contrasted study.  Alternatively, if her creatinine/GFR is still abnormal or worsening, we may have to do a noncontrast CT scan to rule out stones and then additional imaging for renal mass characterization.  She and her daughter are agreeable this plan.  We will plan to order the CT tomorrow depending on repeat labs today and have her follow-up in 2 weeks for results. - Urinalysis, Complete - BLADDER SCAN AMB NON-IMAGING  2.  Left renal mass A solid renal mass raises the suspicion of primary renal malignancy.  We discussed this in detail and in regards to the spectrum of renal masses which includes cysts (pure cysts are considered benign), solid masses and everything in between. The risk of metastasis increases as the size of solid renal mass increases. In general, it is believed that the risk of metastasis for renal masses less than 3-4 cm is small (up to approximately 5%) based mainly on large retrospective studies. In some cases and especially in patients of older age and multiple comorbidities a surveillance approach may be appropriate. The treatment of solid renal masses includes: surveillance, cryoablation (percutaneous and laparoscopic) in addition to partial and complete nephrectomy (each with option of laparoscopic, robotic and open depending on  appropriateness). Furthermore, nephrectomy appears to be an independent risk factor for the development of chronic kidney disease suggesting that nephron sparing approaches should be implored whenever feasible. We reviewed these options in context of the patients current situation as well as the pros and cons of each.  Masses incompletely characterized at this point in time.  We will either further characterize with CT urogram versus consider MR if or not able to give CT IV contrast.  3. Renal insufficiency May be related to #1, repeat BMP today - CULTURE, URINE COMPREHENSIVE - Basic metabolic panel  4. OAB (overactive bladder) History of pyuria, will repeat urinalysis today and treat as needed to see if her OAB type symptoms resolve  if she in fact has evidence of UTI  We discussed OAB especially her primary symptom of nocturia.  We discussed behavioral modification and avoidance of beverages 4 hours before bedtime as well as options for managing her dry mouth.  She believes that she was to start on a medication for this by her primary care physician but does not know what it is.  I did ask her to give it a little more, will reassess at her follow-up.  I asked her to bring a comprehensive medication list next time she comes back.  She is agreeable this plan.   Follow-up CT in 2 weeks  Hollice Espy, MD  [facility] [address] [place], [place] [address] [phone]  I spent 60 total minutes on the day of the encounter including pre-visit review of the medical record, face-to-face time with the patient, and post visit ordering of labs/imaging/tests.

## 2020-02-11 NOTE — Addendum Note (Signed)
Addended by: Verlene Mayer A on: 02/11/2020 04:24 PM   Modules accepted: Orders

## 2020-02-12 ENCOUNTER — Other Ambulatory Visit: Payer: Self-pay | Admitting: *Deleted

## 2020-02-12 DIAGNOSIS — N1330 Unspecified hydronephrosis: Secondary | ICD-10-CM

## 2020-02-12 LAB — BASIC METABOLIC PANEL
BUN/Creatinine Ratio: 12 (ref 12–28)
BUN: 14 mg/dL (ref 8–27)
CO2: 25 mmol/L (ref 20–29)
Calcium: 9.4 mg/dL (ref 8.7–10.3)
Chloride: 100 mmol/L (ref 96–106)
Creatinine, Ser: 1.21 mg/dL — ABNORMAL HIGH (ref 0.57–1.00)
GFR calc Af Amer: 50 mL/min/{1.73_m2} — ABNORMAL LOW (ref >59)
GFR calc non Af Amer: 44 mL/min/{1.73_m2} — ABNORMAL LOW (ref >59)
Glucose: 78 mg/dL (ref 65–99)
Potassium: 3.9 mmol/L (ref 3.5–5.2)
Sodium: 138 mmol/L (ref 134–144)

## 2020-02-13 LAB — MICROSCOPIC EXAMINATION

## 2020-02-13 LAB — URINALYSIS, COMPLETE
Bilirubin, UA: NEGATIVE
Glucose, UA: NEGATIVE
Ketones, UA: NEGATIVE
Nitrite, UA: POSITIVE — AB
Protein,UA: NEGATIVE
Urobilinogen, Ur: 0.2 mg/dL (ref 0.2–1.0)
pH, UA: 7 (ref 5.0–7.5)

## 2020-02-15 LAB — CULTURE, URINE COMPREHENSIVE

## 2020-02-18 ENCOUNTER — Telehealth: Payer: Self-pay | Admitting: *Deleted

## 2020-02-18 ENCOUNTER — Other Ambulatory Visit: Payer: Self-pay

## 2020-02-18 DIAGNOSIS — N1330 Unspecified hydronephrosis: Secondary | ICD-10-CM

## 2020-02-18 LAB — CULTURE, URINE COMPREHENSIVE

## 2020-02-18 MED ORDER — NITROFURANTOIN MONOHYD MACRO 100 MG PO CAPS: 100.0000 mg | ORAL_CAPSULE | Freq: Two times a day (BID) | ORAL | 0 refills | Status: DC

## 2020-02-18 NOTE — Telephone Encounter (Addendum)
Patient's daughter informed (verified per DPR) sent in RX to pharmacy on file. Voiced understanding.  ----- Message from Hollice Espy, MD sent at 02/18/2020 10:02 AM EST ----- This patient did end up growing bacteria in her urine, Klebsiella.  Given her ongoing urinary symptoms, she may benefit from treatment of this UTI.  I like to treat her with nitrofurantoin i.e. Macrobid 100 mg twice daily for 10 days.  Hollice Espy, MD

## 2020-02-25 ENCOUNTER — Ambulatory Visit
Admission: RE | Admit: 2020-02-25 | Discharge: 2020-02-25 | Disposition: A | Discharge status: Home / Self Care | Payer: Medicare Other | Source: Ambulatory Visit | Attending: Urology | Admitting: Urology

## 2020-02-25 ENCOUNTER — Other Ambulatory Visit: Payer: Self-pay

## 2020-02-25 DIAGNOSIS — N1330 Unspecified hydronephrosis: Secondary | ICD-10-CM

## 2020-02-25 MED ORDER — IOHEXOL 300 MG/ML  SOLN
150.0000 mL | Freq: Once | INTRAMUSCULAR | Status: AC | PRN
  Administered 2020-02-25: 125 mL via INTRAVENOUS

## 2020-02-26 ENCOUNTER — Encounter: Payer: Self-pay | Admitting: Urology

## 2020-02-26 ENCOUNTER — Ambulatory Visit (INDEPENDENT_AMBULATORY_CARE_PROVIDER_SITE_OTHER): Payer: Medicare Other | Admitting: Urology

## 2020-02-26 DIAGNOSIS — D1771 Benign lipomatous neoplasm of kidney: Secondary | ICD-10-CM

## 2020-02-26 DIAGNOSIS — N289 Disorder of kidney and ureter, unspecified: Secondary | ICD-10-CM | POA: Diagnosis not present

## 2020-02-26 DIAGNOSIS — N3281 Overactive bladder: Secondary | ICD-10-CM

## 2020-02-26 DIAGNOSIS — N281 Cyst of kidney, acquired: Secondary | ICD-10-CM | POA: Diagnosis not present

## 2020-02-26 NOTE — Progress Notes (Signed)
02/26/2020 3:26 PM   [name] 09-11-1945 [id number]  Referring provider: No referring provider defined for this encounter.  Chief Complaint  Patient presents with  . Hydronephrosis    HPI: 74 year old female who returns today to follow-up CT urogram for evaluation of possible hydronephrosis.  She was initially seen and evaluated by nephrology for an elevated creatinine up to 1.14.  This is since started trend back towards baseline.  As per the work-up, she had a renal ultrasound which showed what appeared to be mild right hydronephrosis as well as a possible left renal mass.  As part of further evaluation for this, she underwent CT urogram.  This indicated a narrowed right upper pole infundibulum with some chronic changes likely related to scarring/infection.  Additionally, there is a questionable enhancing right lower pole cyst which appeared to be benign based on previous ultrasound.  There is also a lesion consistent with a left-sided AML which was relatively small.  Since last visit, she was also treated for a urinary tract infection.  She continues to struggle with urgency frequency and nocturia following her stroke.  PVRs have been minimal.  Treating with antibiotics did not help her urgency frequency type symptoms.   PMH: Past Medical History:  Diagnosis Date  . Anxiety   . Arthritis    "all over" (03/21/2017)  . Chronic lower back pain   . Chronic prescription benzodiazepine use 09/03/2015  . Depression   . XNTZGYFV(494.4)    "maybe once/week" (04/06/2012) - none recently  . High cholesterol   . History of blood transfusion 2007   "related to my 2nd hip OR" (03/21/2017)  . History of kidney stones   . Hx of smoking 10/22/2014  . Hypertension   . Pneumonia ~ 1995; 1996  . Renal insufficiency 09/03/2015    Surgical History: Past Surgical History:  Procedure Laterality Date  . ANTERIOR HIP REVISION Right 01/26/2015   Procedure: REVISION RIGHT TOTAL HIP  ARTHROPLASTY ACETABULAR COMPONENTS  ANTERIOR APPROACH;  Surgeon: Rod Can, MD;  Location: [facility];  Service: Orthopedics;  Laterality: Right;  . BACK SURGERY    . CARPAL TUNNEL RELEASE Right 1990's  . JOINT REPLACEMENT     RT HIP  REPLACEMENT  . KNEE ARTHROPLASTY Right 03/20/2017   Procedure: RIGHT TOTAL KNEE ARTHROPLASTY WITH COMPUTER NAVIGATION;  Surgeon: Rod Can, MD;  Location: [facility];  Service: Orthopedics;  Laterality: Right;  Needs RNFA  . KNEE ARTHROSCOPY Right 1990's  . LAPAROSCOPIC CHOLECYSTECTOMY  1990's  . LUMBAR WOUND DEBRIDEMENT  04/11/2012   Procedure: LUMBAR WOUND DEBRIDEMENT;  Surgeon: Eustace Moore, MD;  Location: [facility];  Service: Neurosurgery;  Laterality: N/A;  Irrigation and debridement of lumbar wound, Repair of pseudomeningocele not requiring laminectomy  . POSTERIOR LUMBAR FUSION  04/06/2012  . TOTAL HIP ARTHROPLASTY  2005; 2007   "right; left" (03/21/2017)  . VAGINAL HYSTERECTOMY  1979    Home Medications:  Allergies as of 02/26/2020      Reactions   Other Swelling   VARIOUS METALS cause internal swelling      Medication List       Accurate as of February 26, 2020  3:26 PM. If you have any questions, ask your nurse or doctor.        STOP taking these medications   nitrofurantoin (macrocrystal-monohydrate) 100 MG capsule Commonly known as: Macrobid Stopped by: Hollice Espy, MD     TAKE these medications   amLODipine 2.5 MG tablet Commonly known as: NORVASC  Take 2.5 mg by mouth daily.   aspirin 81 MG chewable tablet Chew 1 tablet (81 mg total) by mouth 2 (two) times daily.   atenolol 50 MG tablet Commonly known as: TENORMIN Take 1 tablet by mouth by moth once daily for blood pressure.   atorvastatin 80 MG tablet Commonly known as: LIPITOR Take by mouth.   carvedilol 25 MG tablet Commonly known as: COREG Take by mouth.   clopidogrel 75 MG tablet Commonly known as: PLAVIX Take by mouth.   gabapentin 300 MG  capsule Commonly known as: NEURONTIN Take 2 capsules (600 mg total) by mouth at bedtime.   nortriptyline 75 MG capsule Commonly known as: PAMELOR TAKE ONE CAPSULE BY MOUTH ONCE DAILY AT BEDTIME   pravastatin 20 MG tablet Commonly known as: PRAVACHOL TAKE 1 TABLET BY MOUTH AT BEDTIME       Allergies:  Allergies  Allergen Reactions  . Other Swelling    VARIOUS METALS cause internal swelling    Family History: Family History  Problem Relation Age of Onset  . Heart attack Mother   . Alzheimer's disease Mother   . Hypertension Daughter   . Breast cancer Neg Hx     Social History:  reports that she quit smoking about 3 years ago. Her smoking use included cigarettes. She has a 19.00 pack-year smoking history. She has never used smokeless tobacco. She reports that she does not drink alcohol and does not use drugs.   Physical Exam: Constitutional:  Alert and oriented, No acute distress.  Accompanied by her daughter today.  In wheelchair. HEENT: [place] AT, moist mucus membranes.  Trachea midline, no masses. Cardiovascular: No clubbing, cyanosis, or edema. Respiratory: Normal respiratory effort, no increased work of breathing. Skin: No rashes, bruises or suspicious lesions. Neurologic: Grossly intact, no focal deficits, moving all 4 extremities. Psychiatric: Normal mood and affect.  Laboratory Data: Lab Results  Component Value Date   WBC 8.8 12/15/2017   HGB 13.4 12/15/2017   HCT 41.2 12/15/2017   MCV 83.3 12/15/2017   PLT 284.0 12/15/2017    Lab Results  Component Value Date   CREATININE 1.21 (H) 02/11/2020    Pertinent Imaging: CT HEMATURIA WORKUP  Narrative CLINICAL DATA:  Evaluate for hydronephrosis. Follow-up ultrasound showing enlarged kidney.  EXAM: CT ABDOMEN AND PELVIS WITHOUT AND WITH CONTRAST  TECHNIQUE: Multidetector CT imaging of the abdomen and pelvis was performed following the standard protocol before and following the bolus administration of  intravenous contrast.  CONTRAST:  122mL OMNIPAQUE IOHEXOL 300 MG/ML  SOLN  COMPARISON:  01/27/2020  FINDINGS: Lower chest: No acute findings. Scarring noted within the lingula and posterior right lower lobe.  Hepatobiliary: No focal liver abnormality is seen. Status post cholecystectomy. No biliary dilatation.  Pancreas: Unremarkable. No pancreatic ductal dilatation or surrounding inflammatory changes.  Spleen: Normal in size without focal abnormality.  Adrenals/Urinary Tract: The adrenal glands appear within normal limits. No kidney stones identified bilaterally. Exophytic cyst arises off the inferior pole of the right kidney measuring 2.6 cm, image 36/6. Pre contrast images this measures 14.95 Hounsfield units. On the postcontrast images this measures 34 Hounsfield units.  Corresponding to the ultrasound abnormality is a 1 cm fat attenuating lesion arising from the medial cortex of the upper pole of left kidney consistent with a benign angiomyolipoma. Within the lower pole cortex of the left kidney there is a 0.8 cm low-attenuation structure which is too small to reliably characterize, image 59/9.  Focal area of scarring and cortical volume  loss is identified involving the right upper pole. Additionally, there is focal dilatation of the right upper pole calices. On the delayed images there is a focal area of high-grade stenosis involving the right upper pole infundibulum, image 54/6. No underlying enhancing lesion noted. No hydroureter identified bilaterally.  Bilateral hip prosthesis create significant streak artifact within the pelvis. This results in significantly diminished exam detail through the bladder. Pelvic floor laxity is identified with signs of a cystocele. A bladder diverticula is suspected within the right posterior pelvis where focal accumulation of excreted contrast material is noted, image 70/13.  Stomach/Bowel: Stomach is within normal limits.  Appendix appears normal. No evidence of bowel wall thickening, distention, or inflammatory changes.  Vascular/Lymphatic: Aortic atherosclerosis. No aneurysm. No abdominopelvic adenopathy.  Reproductive: Status post hysterectomy. No adnexal masses.  Other: No abdominal wall hernia or abnormality. No abdominopelvic ascites.  Musculoskeletal: Bilateral hip arthroplasty. Thoracolumbar degenerative disc disease is identified. Status post pedicle screw and posterior rod hardware fixation of L4-5 with interbody fusion and posterior laminectomy. Multi level degenerative disc disease is identified throughout the remaining portions of the lumbar spine. Solid fusion of T10/T11 noted. No acute or suspicious osseous findings.  IMPRESSION: 1. There is a focal area of narrowing involving the right upper pole infundibulum. There is corresponding right upper pole caliectasis along with focal cortical scarring and volume loss within the upper pole of right kidney. Line enhancing mass identified. Findings are favored to represent sequelae of prior inflammation/infection. 2. Exophytic cyst arises off the inferior pole of the right kidney. Although the ultrasound appearance of this lesion suggest a benign, simple cyst, on the postcontrast images the internal Hounsfield units increased from 14.9 up to 34. Cannot rule out underlying enhancing kidney lesion. More definitive characterization can be obtained with renal protocol MRI without with contrast material. 3. 1 cm angiomyolipoma arises from the upper pole of the left kidney and likely corresponds with the indeterminate hyperechoic left renal cortical mass. 4. Pelvic floor laxity with signs of a cystocele.  Aortic Atherosclerosis (ICD10-I70.0).   Electronically Signed By: Kerby Moors M.D. On: 02/26/2020 10:27  CT scan was reviewed.  Agree with radiologic interpretation.  This also directly compared to previous renal  ultrasound.   Assessment & Plan:    1. Acquired cyst of kidney Right lower pole renal cyst measuring approximately 3.5 cm in the right lower pole, previously felt to be benign on ultrasound but equivocal enhancement today on CT urogram.  In all probability, this likely benign.  As such, I recommended an MR in 6 months for both surveillance and follow-up.  She is agreeable this plan.  No evidence of right-sided hydronephrosis, some cortical scarring with infundibular stenosis likely contributing to previous renal ultrasound findings.  No evidence of true obstruction.  This is likely not contributing to her renal insufficiency. - MR Abdomen W Wo Contrast; Future  2. OAB (overactive bladder) Continues to be symptomatic, not related to infection as she has had no benefit from antibiotics  Suspect she has some underlying OAB/urinary urgency frequency related to her stroke.  She was given samples of Myrbetriq 25 mg to try for a month.  She will let us know if this is effective.  3. Renal insufficiency Likely multifactorial, not related to obstruction or incomplete emptying  4. Angiomyolipoma of left kidney Incidental benign lesion, no further surveillance needed   Follow-up in 6 months with MRI of the abdomen with and without contrast  Hollice Espy, MD  [facility]  [address], [place], [place] [address] [phone]

## 2020-08-21 ENCOUNTER — Ambulatory Visit
Admission: RE | Admit: 2020-08-21 | Discharge: 2020-08-21 | Disposition: A | Discharge status: Home / Self Care | Payer: Medicare Other | Source: Ambulatory Visit | Attending: Urology | Admitting: Urology

## 2020-08-21 ENCOUNTER — Other Ambulatory Visit: Payer: Self-pay

## 2020-08-21 DIAGNOSIS — N281 Cyst of kidney, acquired: Secondary | ICD-10-CM | POA: Diagnosis not present

## 2020-08-21 MED ORDER — GADOBUTROL 1 MMOL/ML IV SOLN
7.0000 mL | Freq: Once | INTRAVENOUS | Status: AC | PRN
  Administered 2020-08-21: 7 mL via INTRAVENOUS

## 2020-08-25 ENCOUNTER — Other Ambulatory Visit: Payer: Self-pay

## 2020-08-25 ENCOUNTER — Ambulatory Visit: Payer: Medicare Other | Admitting: Urology

## 2020-08-25 ENCOUNTER — Encounter: Payer: Self-pay | Admitting: Urology

## 2020-08-25 VITALS — BP 176/92 | HR 82 | Ht 65.0 in

## 2020-08-25 DIAGNOSIS — N3281 Overactive bladder: Secondary | ICD-10-CM | POA: Diagnosis not present

## 2020-08-25 DIAGNOSIS — N281 Cyst of kidney, acquired: Secondary | ICD-10-CM

## 2020-08-25 MED ORDER — MIRABEGRON ER 50 MG PO TB24: 50.0000 mg | ORAL_TABLET | Freq: Every day | ORAL | 0 refills | Status: DC

## 2020-08-25 NOTE — Progress Notes (Signed)
08/25/2020 2:50 PM   Felicia Little SHRON OZER [birth year] [id number]  Referring provider: [name], NP [address] [place],  [place] [address]  Chief Complaint  Patient presents with  . Follow-up    HPI: 75 year old female who returns today with follow-up MRI for history of possible abnormal renal cyst.  She also has urinary urgency frequency.  She was initially seen and evaluated by nephrology for an elevated creatinine up to 1.14.  This is since started trend back towards baseline.  As per the work-up, she had a renal ultrasound which showed what appeared to be mild right hydronephrosis as well as a possible left renal mass.  As part of further evaluation for this, she underwent CT urogram.  This indicated a narrowed right upper pole infundibulum with some chronic changes likely related to scarring/infection.  Additionally, there is a questionable enhancing right lower pole cyst which appeared to be benign based on previous ultrasound.  There is also a lesion consistent with a left-sided AML which was relatively small.  She underwent an MRI of the abdomen on 08/21/2020 which was personally reviewed today.  There are multiple simple cyst, the largest of which measures 3.2 cm on the right lower pole.  All of the lesions are Bosniak 1.  In addition to the above, she has significant urinary urgency frequency with urge incontinence.  She wears diapers.  At last visit, she was given Myrbetriq 25 mg to try for months.  She does not remember getting the samples, if she took the medications, and whether or not it helped.  Her urinary symptoms are very bothersome to her and she like to be able to get to the bathroom on time.  She does report that she drinks a lot of water because a lot of her medications cause dry mouth.  PMH: Past Medical History:  Diagnosis Date  . Anxiety   . Arthritis    "all over" (03/21/2017)  . Chronic lower back pain   . Chronic prescription benzodiazepine use  09/03/2015  . Depression   . YNWGNFAO(130.8)    "maybe once/week" (04/06/2012) - none recently  . High cholesterol   . History of blood transfusion 2007   "related to my 2nd hip OR" (03/21/2017)  . History of kidney stones   . Hx of smoking 10/22/2014  . Hypertension   . Pneumonia ~ 1995; 1996  . Renal insufficiency 09/03/2015    Surgical History: Past Surgical History:  Procedure Laterality Date  . ANTERIOR HIP REVISION Right 01/26/2015   Procedure: REVISION RIGHT TOTAL HIP ARTHROPLASTY ACETABULAR COMPONENTS  ANTERIOR APPROACH;  Surgeon: Rod Can, MD;  Location: [facility];  Service: Orthopedics;  Laterality: Right;  . BACK SURGERY    . CARPAL TUNNEL RELEASE Right 1990's  . JOINT REPLACEMENT     RT HIP  REPLACEMENT  . KNEE ARTHROPLASTY Right 03/20/2017   Procedure: RIGHT TOTAL KNEE ARTHROPLASTY WITH COMPUTER NAVIGATION;  Surgeon: Rod Can, MD;  Location: [facility];  Service: Orthopedics;  Laterality: Right;  Needs RNFA  . KNEE ARTHROSCOPY Right 1990's  . LAPAROSCOPIC CHOLECYSTECTOMY  1990's  . LUMBAR WOUND DEBRIDEMENT  04/11/2012   Procedure: LUMBAR WOUND DEBRIDEMENT;  Surgeon: Eustace Moore, MD;  Location: [facility];  Service: Neurosurgery;  Laterality: N/A;  Irrigation and debridement of lumbar wound, Repair of pseudomeningocele not requiring laminectomy  . POSTERIOR LUMBAR FUSION  04/06/2012  . TOTAL HIP ARTHROPLASTY  2005; 2007   "right; left" (03/21/2017)  . [place]  Home Medications:  Allergies as of 08/25/2020      Reactions   Other Swelling   VARIOUS METALS cause internal swelling      Medication List       Accurate as of Aug 25, 2020  2:50 PM. If you have any questions, ask your nurse or doctor.        STOP taking these medications   atenolol 50 MG tablet Commonly known as: TENORMIN Stopped by: Hollice Espy, MD     TAKE these medications   amLODipine 2.5 MG tablet Commonly known as: NORVASC Take 2.5 mg by mouth daily.   aspirin  81 MG chewable tablet Chew 1 tablet (81 mg total) by mouth 2 (two) times daily.   atorvastatin 80 MG tablet Commonly known as: LIPITOR Take by mouth.   carvedilol 25 MG tablet Commonly known as: COREG Take by mouth.   clopidogrel 75 MG tablet Commonly known as: PLAVIX Take by mouth.   gabapentin 300 MG capsule Commonly known as: NEURONTIN Take 2 capsules (600 mg total) by mouth at bedtime.   nortriptyline 75 MG capsule Commonly known as: PAMELOR TAKE ONE CAPSULE BY MOUTH ONCE DAILY AT BEDTIME   pravastatin 20 MG tablet Commonly known as: PRAVACHOL TAKE 1 TABLET BY MOUTH AT BEDTIME   sertraline 50 MG tablet Commonly known as: ZOLOFT Take 1 tablet by mouth daily.       Allergies:  Allergies  Allergen Reactions  . Other Swelling    VARIOUS METALS cause internal swelling    Family History: Family History  Problem Relation Age of Onset  . Heart attack Mother   . Alzheimer's disease Mother   . Hypertension Daughter   . Breast cancer Neg Hx     Social History:  reports that she quit smoking about 3 years ago. Her smoking use included cigarettes. She has a 19.00 pack-year smoking history. She has never used smokeless tobacco. She reports that she does not drink alcohol and does not use drugs.   Physical Exam: BP (!) 176/92   Pulse 82   Ht 5\' 5"  (1.651 m)   BMI 28.29 kg/m   Constitutional:  Alert and oriented, No acute distress. HEENT: [place] AT, moist mucus membranes.  Trachea midline, no masses. Cardiovascular: No clubbing, cyanosis, or edema. Skin: No rashes, bruises or suspicious lesions. Neurologic: Grossly intact, no focal deficits, moving all 4 extremities. Psychiatric: Normal mood and affect.  Pertinent Imaging: IMPRESSION: 1. Multiple simple (Bosniak class 1) cysts are noted in the kidneys bilaterally. No suspicious renal lesions. 2. Colonic diverticulosis. 3. Additional incidental findings, as above.   Electronically Signed   By: Vinnie Langton M.D.   On: 08/24/2020 13:15  MRI images were personally reviewed, agree with radiologic interpretation  Assessment & Plan:    1. Acquired cyst of kidney MRI definitive for Bosniak 1 renal cyst, no evidence of underlying malignancy or potential malignancy  No further follow-up imaging is indicated at this time, patient was reassured  2. OAB (overactive bladder) Continues to have significant bother from urgency and urge incontinence, likely related to previous stroke  She was given samples of Myrbetriq again today, 50 mg this time for 1 month.  We will have her follow-up just next month to see if this is effective or not.   Hollice Espy, MD  [facility] [address], [place], [place] [address] [phone]

## 2020-09-16 ENCOUNTER — Other Ambulatory Visit: Payer: Self-pay | Admitting: Student

## 2020-09-16 DIAGNOSIS — Z1231 Encounter for screening mammogram for malignant neoplasm of breast: Secondary | ICD-10-CM

## 2020-09-25 ENCOUNTER — Encounter: Payer: Self-pay | Admitting: Physician Assistant

## 2020-09-25 ENCOUNTER — Ambulatory Visit (INDEPENDENT_AMBULATORY_CARE_PROVIDER_SITE_OTHER): Payer: Medicare Other | Admitting: Physician Assistant

## 2020-09-25 VITALS — BP 155/90 | HR 79 | Ht 67.0 in | Wt 170.0 lb

## 2020-09-25 DIAGNOSIS — N3281 Overactive bladder: Secondary | ICD-10-CM

## 2020-09-25 LAB — BLADDER SCAN AMB NON-IMAGING: Scan Result: 6

## 2020-09-25 MED ORDER — MIRABEGRON ER 50 MG PO TB24: 50.0000 mg | ORAL_TABLET | Freq: Every day | ORAL | 0 refills | Status: DC

## 2020-09-25 NOTE — Patient Instructions (Signed)
Try the Biotene brand of oral moisturizers to soothe your dry mouth. Hopefully this will allow you to decrease your fluid intake.

## 2020-09-28 NOTE — Progress Notes (Signed)
09/25/2020 3:29 PM   Felicia Little 1946-01-21 [id number]  CC: Chief Complaint  Patient presents with   Hydronephrosis   HPI: Felicia Little is a 75 y.o. female with PMH OAB wet who presents today for symptom recheck on Myrbetriq 50 mg daily.   Today she reports slightly improved urinary symptoms on Myrbetriq.  She describes urinary frequency as well as nocturia every hour.  She continues to wear diapers, but states she is changing these less often.  Notably, patient reports significant dry mouth at baseline.  She drinks approximately half a gallon of water daily as well as 2 cups of water overnight because of this.  PVR 6 mL.  PMH: Past Medical History:  Diagnosis Date   Anxiety    Arthritis    "all over" (03/21/2017)   Chronic lower back pain    Chronic prescription benzodiazepine use 09/03/2015   Depression    Headache(784.0)    "maybe once/week" (04/06/2012) - none recently   High cholesterol    History of blood transfusion 2007   "related to my 2nd hip OR" (03/21/2017)   History of kidney stones    Hx of smoking 10/22/2014   Hypertension    Pneumonia ~ 1995; 1996   Renal insufficiency 09/03/2015    Surgical History: Past Surgical History:  Procedure Laterality Date   ANTERIOR HIP REVISION Right 01/26/2015   Procedure: REVISION RIGHT TOTAL HIP ARTHROPLASTY ACETABULAR COMPONENTS  ANTERIOR APPROACH;  Surgeon: Rod Can, MD;  Location: [facility];  Service: Orthopedics;  Laterality: Right;   BACK SURGERY     CARPAL TUNNEL RELEASE Right 1990's   JOINT REPLACEMENT     RT HIP  REPLACEMENT   KNEE ARTHROPLASTY Right 03/20/2017   Procedure: RIGHT TOTAL KNEE ARTHROPLASTY WITH COMPUTER NAVIGATION;  Surgeon: Rod Can, MD;  Location: [facility];  Service: Orthopedics;  Laterality: Right;  Needs RNFA   KNEE ARTHROSCOPY Right 1990's   LAPAROSCOPIC CHOLECYSTECTOMY  1990's   LUMBAR WOUND DEBRIDEMENT  04/11/2012   Procedure: LUMBAR WOUND DEBRIDEMENT;  Surgeon: Eustace Moore, MD;   Location: [facility];  Service: Neurosurgery;  Laterality: N/A;  Irrigation and debridement of lumbar wound, Repair of pseudomeningocele not requiring laminectomy   POSTERIOR LUMBAR FUSION  04/06/2012   TOTAL HIP ARTHROPLASTY  2005; 2007   "right; left" (03/21/2017)   VAGINAL HYSTERECTOMY  1979    Home Medications:  Allergies as of 09/25/2020       Reactions   Other Swelling   VARIOUS METALS cause internal swelling        Medication List        Accurate as of September 25, 2020 11:59 PM. If you have any questions, ask your nurse or doctor.          amLODipine 2.5 MG tablet Commonly known as: NORVASC Take 2.5 mg by mouth daily.   aspirin 81 MG chewable tablet Chew 1 tablet (81 mg total) by mouth 2 (two) times daily.   atorvastatin 80 MG tablet Commonly known as: LIPITOR Take by mouth.   carvedilol 25 MG tablet Commonly known as: COREG Take by mouth.   clopidogrel 75 MG tablet Commonly known as: PLAVIX Take by mouth.   gabapentin 300 MG capsule Commonly known as: NEURONTIN Take 2 capsules (600 mg total) by mouth at bedtime.   mirabegron ER 50 MG Tb24 tablet Commonly known as: MYRBETRIQ Take 1 tablet (50 mg total) by mouth daily.   nortriptyline 75 MG capsule Commonly known as: PAMELOR  TAKE ONE CAPSULE BY MOUTH ONCE DAILY AT BEDTIME   pravastatin 20 MG tablet Commonly known as: PRAVACHOL TAKE 1 TABLET BY MOUTH AT BEDTIME   sertraline 50 MG tablet Commonly known as: ZOLOFT Take 1 tablet by mouth daily.        Allergies:  Allergies  Allergen Reactions   Other Swelling    VARIOUS METALS cause internal swelling    Family History: Family History  Problem Relation Age of Onset   Heart attack Mother    Alzheimer's disease Mother    Hypertension Daughter    Breast cancer Neg Hx     Social History:   reports that she quit smoking about 3 years ago. Her smoking use included cigarettes. She has a 19.00 pack-year smoking history. She has never used  smokeless tobacco. She reports that she does not drink alcohol and does not use drugs.  Physical Exam: BP (!) 155/90   Pulse 79   Ht 5\' 7"  (1.702 m)   Wt 170 lb (77.1 kg)   BMI 26.63 kg/m   Constitutional:  Alert and oriented, no acute distress, nontoxic appearing HEENT: [place], AT Cardiovascular: No clubbing, cyanosis, or edema Respiratory: Normal respiratory effort, no increased work of breathing Skin: No rashes, bruises or suspicious lesions Neurologic: Grossly intact, no focal deficits, moving all 4 extremities Psychiatric: Normal mood and affect  Laboratory Data: Results for orders placed or performed in visit on 09/25/20  Bladder Scan (Post Void Residual) in office  Result Value Ref [place]   Scan Result 6    Assessment & Plan:   1. OAB (overactive bladder) Slight symptomatic improvement on Myrbetriq 50 mg daily.  She reports significant p.o. fluid intake for management of dry mouth, which I believe is contributory.  We discussed continuing Myrbetriq today versus consideration of alternative therapies.  Patient reports difficulty coming to clinic every week because she has a dog with separation anxiety.  Additionally, she is not interested in pursuing surgery and is hesitant to pursue intravesical Botox due to concerns for urinary retention requiring self-catheterization.  At this point, we will plan to continue Myrbetriq 50 mg daily x3 months.  In the interim, I counseled her to start Biotene brand oral moisturizers and attempt to reduce her fluid intake, especially after dinner and overnight.  We will plan for symptom recheck upon completion. - Bladder Scan (Post Void Residual) in office - mirabegron ER (MYRBETRIQ) 50 MG TB24 tablet; Take 1 tablet (50 mg total) by mouth daily.  Dispense: 90 each; Refill: 0  Return in about 3 months (around 12/26/2020) for Symptom recheck with PVR.  Debroah Loop, PA-C  [facility] [address], [place], [place] [address] [phone]

## 2020-10-12 ENCOUNTER — Ambulatory Visit
Admission: RE | Admit: 2020-10-12 | Discharge: 2020-10-12 | Disposition: A | Discharge status: Home / Self Care | Payer: Medicare Other | Source: Ambulatory Visit | Attending: Student | Admitting: Student

## 2020-10-12 ENCOUNTER — Other Ambulatory Visit: Payer: Self-pay

## 2020-10-12 DIAGNOSIS — Z1231 Encounter for screening mammogram for malignant neoplasm of breast: Secondary | ICD-10-CM | POA: Insufficient documentation

## 2020-12-28 ENCOUNTER — Ambulatory Visit: Payer: Medicare Other | Admitting: Physician Assistant

## 2020-12-28 ENCOUNTER — Encounter: Payer: Self-pay | Admitting: Physician Assistant

## 2020-12-28 ENCOUNTER — Other Ambulatory Visit: Payer: Self-pay

## 2020-12-28 VITALS — BP 123/72 | HR 64 | Ht 65.0 in | Wt 173.0 lb

## 2020-12-28 DIAGNOSIS — N3281 Overactive bladder: Secondary | ICD-10-CM

## 2020-12-28 LAB — BLADDER SCAN AMB NON-IMAGING

## 2020-12-28 MED ORDER — GEMTESA 75 MG PO TABS: 75.0000 mg | ORAL_TABLET | Freq: Every day | ORAL | 0 refills | Status: DC

## 2020-12-28 NOTE — Progress Notes (Signed)
12/28/2020 2:48 PM   Felicia Little 1945-07-30 [id number]  CC: Chief Complaint  Patient presents with   Over Active Bladder    HPI: Felicia Little is a 75 y.o. female with PMH OAB wet on Myrbetriq 50 mg daily and dry mouth who presents today for symptom recheck and PVR.   Today she reports she stopped Myrbetriq because it was not working for her.  She has tried oral moisturizers since her last clinic visit with me as recommended, which have improved her dry mouth.  However, she states she continues to drink a lot of water because she enjoys drinking water.  PVR 88mL.  As previously noted, she has previously declined PTNS due to difficulty coming to clinic every week.  She has also declined surgical intervention and is hesitant to pursue intravesical Botox due to concerns for urinary retention requiring self-catheterization  PMH: Past Medical History:  Diagnosis Date   Anxiety    Arthritis    "all over" (03/21/2017)   Chronic lower back pain    Chronic prescription benzodiazepine use 09/03/2015   Depression    Headache(784.0)    "maybe once/week" (04/06/2012) - none recently   High cholesterol    History of blood transfusion 2007   "related to my 2nd hip OR" (03/21/2017)   History of kidney stones    Hx of smoking 10/22/2014   Hypertension    Pneumonia ~ 1995; 1996   Renal insufficiency 09/03/2015    Surgical History: Past Surgical History:  Procedure Laterality Date   ANTERIOR HIP REVISION Right 01/26/2015   Procedure: REVISION RIGHT TOTAL HIP ARTHROPLASTY ACETABULAR COMPONENTS  ANTERIOR APPROACH;  Surgeon: Rod Can, MD;  Location: [place];  Service: Orthopedics;  Laterality: Right;   BACK SURGERY     CARPAL TUNNEL RELEASE Right 1990's   JOINT REPLACEMENT     RT HIP  REPLACEMENT   KNEE ARTHROPLASTY Right 03/20/2017   Procedure: RIGHT TOTAL KNEE ARTHROPLASTY WITH COMPUTER NAVIGATION;  Surgeon: Rod Can, MD;  Location: [place];  Service: Orthopedics;  Laterality:  Right;  Needs RNFA   KNEE ARTHROSCOPY Right 1990's   LAPAROSCOPIC CHOLECYSTECTOMY  1990's   LUMBAR WOUND DEBRIDEMENT  04/11/2012   Procedure: LUMBAR WOUND DEBRIDEMENT;  Surgeon: Eustace Moore, MD;  Location: [place] [facility];  Service: Neurosurgery;  Laterality: N/A;  Irrigation and debridement of lumbar wound, Repair of pseudomeningocele not requiring laminectomy   POSTERIOR LUMBAR FUSION  04/06/2012   TOTAL HIP ARTHROPLASTY  2005; 2007   "right; left" (03/21/2017)   VAGINAL HYSTERECTOMY  1979    Home Medications:  Allergies as of 12/28/2020       Reactions   Other Swelling   VARIOUS METALS cause internal swelling        Medication List        Accurate as of December 28, 2020  2:48 PM. If you have any questions, ask your nurse or doctor.          amLODipine 2.5 MG tablet Commonly known as: NORVASC Take 2.5 mg by mouth daily.   aspirin 81 MG chewable tablet Chew 1 tablet (81 mg total) by mouth 2 (two) times daily.   atorvastatin 80 MG tablet Commonly known as: LIPITOR Take by mouth.   carvedilol 25 MG tablet Commonly known as: COREG Take by mouth.   clopidogrel 75 MG tablet Commonly known as: PLAVIX Take by mouth.   gabapentin 300 MG capsule Commonly known as: NEURONTIN Take 2 capsules (600 mg total) by  mouth at bedtime.   mirabegron ER 50 MG Tb24 tablet Commonly known as: MYRBETRIQ Take 1 tablet (50 mg total) by mouth daily.   nortriptyline 75 MG capsule Commonly known as: PAMELOR TAKE ONE CAPSULE BY MOUTH ONCE DAILY AT BEDTIME   pravastatin 20 MG tablet Commonly known as: PRAVACHOL TAKE 1 TABLET BY MOUTH AT BEDTIME   sertraline 50 MG tablet Commonly known as: ZOLOFT Take 1 tablet by mouth daily.        Allergies:  Allergies  Allergen Reactions   Other Swelling    VARIOUS METALS cause internal swelling    Family History: Family History  Problem Relation Age of Onset   Heart attack Mother    Alzheimer's disease Mother    Hypertension  Daughter    Breast cancer Neg Hx     Social History:   reports that she quit smoking about 3 years ago. Her smoking use included cigarettes. She has a 19.00 pack-year smoking history. She has never used smokeless tobacco. She reports that she does not drink alcohol and does not use drugs.  Physical Exam: BP 123/72   Pulse 64   Ht 5\' 5"  (1.651 m)   Wt 173 lb (78.5 kg)   BMI 28.79 kg/m   Constitutional:  Alert and oriented, no acute distress, nontoxic appearing HEENT: [place], AT Cardiovascular: No clubbing, cyanosis, or edema Respiratory: Normal respiratory effort, no increased work of breathing Skin: No rashes, bruises or suspicious lesions Neurologic: Grossly intact, no focal deficits, moving all 4 extremities Psychiatric: Normal mood and affect  Laboratory Data: Results for orders placed or performed in visit on 12/28/20  Bladder Scan (Post Void Residual) in office  Result Value Ref Range   Scan Result 39mL    Assessment & Plan:   1. OAB (overactive bladder) Patient discontinued Myrbetriq due to lack of therapeutic effect.  We will start a trial of Gemtesa today.  We discussed that with her significant dry mouth at baseline, I do not recommend a trial of anticholinergics.  As she has previously deferred alternative therapies for OAB, we discussed that we have extremely limited therapeutic options remaining at this point.  If she fails Gemtesa, I recommend follow-up with Dr. Matilde Sprang. - Bladder Scan (Post Void Residual) in office - Vibegron (GEMTESA) 75 MG TABS; Take 75 mg by mouth daily.  Dispense: 28 tablet; Refill: 0   Return in about 4 weeks (around 01/25/2021) for Symptom recheck with PVR.  Debroah Loop, PA-C  [facility] [address] [place], [place] [address] [phone]

## 2020-12-29 ENCOUNTER — Emergency Department
Admission: EM | Admit: 2020-12-29 | Discharge: 2020-12-29 | Discharge status: Against Medical Advice | Payer: Medicare Other

## 2020-12-29 NOTE — ED Notes (Signed)
No answer when called several times from lobby 

## 2021-01-25 ENCOUNTER — Other Ambulatory Visit: Payer: Self-pay

## 2021-01-25 ENCOUNTER — Encounter: Payer: Self-pay | Admitting: Physician Assistant

## 2021-01-25 ENCOUNTER — Ambulatory Visit (INDEPENDENT_AMBULATORY_CARE_PROVIDER_SITE_OTHER): Payer: Medicare Other | Admitting: Physician Assistant

## 2021-01-25 VITALS — BP 131/79 | HR 69 | Ht 67.0 in | Wt 173.0 lb

## 2021-01-25 DIAGNOSIS — N3281 Overactive bladder: Secondary | ICD-10-CM | POA: Diagnosis not present

## 2021-01-25 LAB — BLADDER SCAN AMB NON-IMAGING

## 2021-01-25 MED ORDER — GEMTESA 75 MG PO TABS: 75.0000 mg | ORAL_TABLET | Freq: Every day | ORAL | 11 refills | Status: DC

## 2021-01-25 NOTE — Progress Notes (Signed)
01/25/2021 2:27 PM   [name] 1945/07/31 [id number]  CC: Chief Complaint  Patient presents with   Over Active Bladder   HPI: Felicia Little is a 75 y.o. female with PMH Bosniak 1 renal cyst, OAB wet and dry mouth who previously failed Myrbetriq and declined PTNS, intravesical Botox, and InterStim who presents today for symptom recheck on Gemtesa.   Today she reports sniffing and symptomatic improvement on Gemtesa.  She reports resolution of her urgency and urge incontinence as well as frequency every 60 to 90 minutes.  She continues to drink a lot of water.  She is very pleased with her medication and wishes to continue it.  Bladder scan with 241 mL, last void approximately 1 hour prior.  PMH: Past Medical History:  Diagnosis Date   Anxiety    Arthritis    "all over" (03/21/2017)   Chronic lower back pain    Chronic prescription benzodiazepine use 09/03/2015   Depression    Headache(784.0)    "maybe once/week" (04/06/2012) - none recently   High cholesterol    History of blood transfusion 2007   "related to my 2nd hip OR" (03/21/2017)   History of kidney stones    Hx of smoking 10/22/2014   Hypertension    Pneumonia ~ 1995; 1996   Renal insufficiency 09/03/2015    Surgical History: Past Surgical History:  Procedure Laterality Date   ANTERIOR HIP REVISION Right 01/26/2015   Procedure: REVISION RIGHT TOTAL HIP ARTHROPLASTY ACETABULAR COMPONENTS  ANTERIOR APPROACH;  Surgeon: Rod Can, MD;  Location: [place];  Service: Orthopedics;  Laterality: Right;   BACK SURGERY     CARPAL TUNNEL RELEASE Right 1990's   JOINT REPLACEMENT     RT HIP  REPLACEMENT   KNEE ARTHROPLASTY Right 03/20/2017   Procedure: RIGHT TOTAL KNEE ARTHROPLASTY WITH COMPUTER NAVIGATION;  Surgeon: Rod Can, MD;  Location: [place];  Service: Orthopedics;  Laterality: Right;  Needs RNFA   KNEE ARTHROSCOPY Right 1990's   LAPAROSCOPIC CHOLECYSTECTOMY  1990's   LUMBAR WOUND DEBRIDEMENT  04/11/2012    Procedure: LUMBAR WOUND DEBRIDEMENT;  Surgeon: Eustace Moore, MD;  Location: [place] [facility];  Service: Neurosurgery;  Laterality: N/A;  Irrigation and debridement of lumbar wound, Repair of pseudomeningocele not requiring laminectomy   POSTERIOR LUMBAR FUSION  04/06/2012   TOTAL HIP ARTHROPLASTY  2005; 2007   "right; left" (03/21/2017)   VAGINAL HYSTERECTOMY  1979    Home Medications:  Allergies as of 01/25/2021       Reactions   Other Swelling   VARIOUS METALS cause internal swelling        Medication List        Accurate as of January 25, 2021  2:27 PM. If you have any questions, ask your nurse or doctor.          amLODipine 2.5 MG tablet Commonly known as: NORVASC Take 2.5 mg by mouth daily.   aspirin 81 MG chewable tablet Chew 1 tablet (81 mg total) by mouth 2 (two) times daily.   atorvastatin 80 MG tablet Commonly known as: LIPITOR Take by mouth.   carvedilol 25 MG tablet Commonly known as: COREG Take by mouth.   clopidogrel 75 MG tablet Commonly known as: PLAVIX Take by mouth.   gabapentin 300 MG capsule Commonly known as: NEURONTIN Take 2 capsules (600 mg total) by mouth at bedtime.   Gemtesa 75 MG Tabs Generic drug: Vibegron Take 75 mg by mouth daily.   nortriptyline 75  MG capsule Commonly known as: PAMELOR TAKE ONE CAPSULE BY MOUTH ONCE DAILY AT BEDTIME   pravastatin 20 MG tablet Commonly known as: PRAVACHOL TAKE 1 TABLET BY MOUTH AT BEDTIME   sertraline 50 MG tablet Commonly known as: ZOLOFT Take 1 tablet by mouth daily.        Allergies:  Allergies  Allergen Reactions   Other Swelling    VARIOUS METALS cause internal swelling    Family History: Family History  Problem Relation Age of Onset   Heart attack Mother    Alzheimer's disease Mother    Hypertension Daughter    Breast cancer Neg Hx     Social History:   reports that she quit smoking about 3 years ago. Her smoking use included cigarettes. She has a 19.00 pack-year  smoking history. She has never used smokeless tobacco. She reports that she does not drink alcohol and does not use drugs.  Physical Exam: BP 131/79   Pulse 69   Ht 5\' 7"  (1.702 m)   Wt 173 lb (78.5 kg)   BMI 27.10 kg/m   Constitutional:  Alert and oriented, no acute distress, nontoxic appearing HEENT: [place], AT Cardiovascular: No clubbing, cyanosis, or edema Respiratory: Normal respiratory effort, no increased work of breathing Skin: No rashes, bruises or suspicious lesions Neurologic: Grossly intact, no focal deficits, moving all 4 extremities Psychiatric: Normal mood and affect  Laboratory Data: Results for orders placed or performed in visit on 01/25/21  Bladder Scan (Post Void Residual) in office  Result Value Ref Range   Scan Result 262mL    Assessment & Plan:   1. OAB (overactive bladder) Significant symptomatic improvement on Gemtesa, we will plan to continue this.  Patient is in agreement with this plan. - Bladder Scan (Post Void Residual) in office - Vibegron (GEMTESA) 75 MG TABS; Take 75 mg by mouth daily.  Dispense: 30 tablet; Refill: 11   Return in about 1 year (around 01/25/2022) for Annual OAB f/u with PVR.  Debroah Loop, PA-C  [facility] [address] [place],  [address] [phone]

## 2021-01-27 ENCOUNTER — Telehealth: Payer: Self-pay

## 2021-01-27 NOTE — Telephone Encounter (Signed)
Patient approved for Gemtesa. Request Reference Number: [id number]. GEMTESA TAB 75MG  is approved through 04/03/2022. Your patient may now fill this prescription and it will be covered.

## 2021-02-21 ENCOUNTER — Other Ambulatory Visit: Payer: Self-pay

## 2021-02-21 ENCOUNTER — Emergency Department
Admission: EM | Admit: 2021-02-21 | Discharge: 2021-02-21 | Disposition: A | Discharge status: Home / Self Care | Payer: Medicare Other | Attending: Emergency Medicine | Admitting: Emergency Medicine

## 2021-02-21 ENCOUNTER — Emergency Department: Payer: Medicare Other

## 2021-02-21 DIAGNOSIS — Z79899 Other long term (current) drug therapy: Secondary | ICD-10-CM | POA: Diagnosis not present

## 2021-02-21 DIAGNOSIS — Z96651 Presence of right artificial knee joint: Secondary | ICD-10-CM | POA: Insufficient documentation

## 2021-02-21 DIAGNOSIS — Z87891 Personal history of nicotine dependence: Secondary | ICD-10-CM | POA: Diagnosis not present

## 2021-02-21 DIAGNOSIS — I10 Essential (primary) hypertension: Secondary | ICD-10-CM | POA: Diagnosis not present

## 2021-02-21 DIAGNOSIS — M25512 Pain in left shoulder: Secondary | ICD-10-CM | POA: Insufficient documentation

## 2021-02-21 DIAGNOSIS — M542 Cervicalgia: Secondary | ICD-10-CM | POA: Diagnosis present

## 2021-02-21 DIAGNOSIS — M436 Torticollis: Secondary | ICD-10-CM | POA: Diagnosis not present

## 2021-02-21 DIAGNOSIS — Z96641 Presence of right artificial hip joint: Secondary | ICD-10-CM | POA: Diagnosis not present

## 2021-02-21 DIAGNOSIS — Z7982 Long term (current) use of aspirin: Secondary | ICD-10-CM | POA: Diagnosis not present

## 2021-02-21 MED ORDER — HYDROCODONE-ACETAMINOPHEN 5-325 MG PO TABS
1.0000 | ORAL_TABLET | Freq: Once | ORAL | Status: AC
Start: 2021-02-21 — End: 2021-02-21
  Administered 2021-02-21: 1 via ORAL
  Filled 2021-02-21: qty 1

## 2021-02-21 MED ORDER — METHOCARBAMOL 500 MG PO TABS: 500.0000 mg | ORAL_TABLET | Freq: Three times a day (TID) | ORAL | 0 refills | Status: DC

## 2021-02-21 MED ORDER — HYDROCODONE-ACETAMINOPHEN 5-325 MG PO TABS: 1.0000 | ORAL_TABLET | Freq: Four times a day (QID) | ORAL | 0 refills | Status: DC | PRN

## 2021-02-21 NOTE — ED Triage Notes (Signed)
Pt comes with c/o neck pain for 3 days. Pt states she woke up like this. Pt states pain to turn neck. Pt denies any recent injuries.

## 2021-02-21 NOTE — Discharge Instructions (Addendum)
Follow-up with your regular doctor if not improving to 3 days. Do not take the tramadol with the hydrocodone If the hydrocodone makes you drowsy you may want to cut it in half If the Robaxin makes you drowsy you can cut it in half Apply a warm compress to the left shoulder, this will help alleviate some of the spasm Return if worsening

## 2021-02-21 NOTE — ED Provider Notes (Signed)
Emergency Medicine Provider Triage Evaluation Note  Felicia Little , a 75 y.o. female  was evaluated in triage.  Pt complains of left-sided neck pain.  Nontraumatic in nature.  Patient woke up 3 days with slight pain in the left side of the neck, this is worsened.  She states that it feels "tight" in regards to the pain.  No radicular symptoms.  No chest pain.  No sore throat.  No headache.  No history of chronic neck issues.  Review of Systems  Positive: Left-sided neck pain Negative: Trauma, headache, visual changes, radicular symptoms, chest pain, shortness of breath  Physical Exam  BP 129/66   Pulse 67   Temp 98 F (36.7 C)   Resp 18   SpO2 100%  Gen:   Awake, no distress   Resp:  Normal effort  MSK:   Moves extremities without difficulty no visible signs of trauma to the spine.  Palpation along the left paraspinal muscle extending into the trapezius muscle is tender.  Appreciable spasm on the left compared to right.  Sensation and pulses intact upper extremities. Other:    Medical Decision Making  Medically screening exam initiated at 5:42 PM.  Appropriate orders placed.  ROYLENE HEATON was informed that the remainder of the evaluation will be completed by another provider, this initial triage assessment does not replace that evaluation, and the importance of remaining in the ED until their evaluation is complete.  Patient presented with left-sided neck pain.  Nontraumatic in nature.  Patient had reproducible tenderness along the paraspinal muscle group and trapezius muscle.  Patient denied any cardiac symptoms to include chest pain, shortness of breath or palpitations.  No radicular symptoms of numbness, tingling.  Exam revealed tenderness along the musculature.  Patient will have cervical spine x-ray at this time.   Brynda Peon 02/21/21 1746    Blake Divine, MD 02/22/21 [phone]

## 2021-02-21 NOTE — ED Provider Notes (Signed)
[facility] Emergency Department Provider Note  ____________________________________________   Event Date/Time   First MD Initiated Contact with Patient 02/21/21 1836     (approximate)  I have reviewed the triage vital signs and the nursing notes.   HISTORY  Chief Complaint Neck Injury    HPI Felicia Little is a 75 y.o. female presents emergency department complaining of left-sided neck and shoulder pain.  Patient states have a large spasm minute.  It hurts to turn my neck and left neck shoulder.  States that took tramadol without any relief.  Patient already takes gabapentin for chronic pain.  Symptoms have been ongoing for 3 days.  She denies numbness or tingling  Past Medical History:  Diagnosis Date   Anxiety    Arthritis    "all over" (03/21/2017)   Chronic lower back pain    Chronic prescription benzodiazepine use 09/03/2015   Depression    Headache(784.0)    "maybe once/week" (04/06/2012) - none recently   High cholesterol    History of blood transfusion 2007   "related to my 2nd hip OR" (03/21/2017)   History of kidney stones    Hx of smoking 10/22/2014   Hypertension    Pneumonia ~ 1995; 1996   Renal insufficiency 09/03/2015    Patient Active Problem List   Diagnosis Date Noted   Osteoarthritis of right knee 03/20/2017   Renal insufficiency 09/03/2015   Controlled substance agreement signed 09/03/2015   Medication monitoring encounter 09/03/2015   Chronic pain 04/23/2015   Instability of prosthetic hip ([place]) 01/26/2015   Hyperlipidemia 10/22/2014   Primary osteoarthritis involving multiple joints 10/22/2014   Hx of smoking 10/22/2014   Anxiety and depression 10/21/2014   Essential hypertension 10/21/2014   Tendinitis of right shoulder 08/21/2013   Other shoulder lesions, right shoulder 08/21/2013    Past Surgical History:  Procedure Laterality Date   ANTERIOR HIP REVISION Right 01/26/2015   Procedure: REVISION RIGHT TOTAL HIP  ARTHROPLASTY ACETABULAR COMPONENTS  ANTERIOR APPROACH;  Surgeon: Rod Can, MD;  Location: [place];  Service: Orthopedics;  Laterality: Right;   BACK SURGERY     CARPAL TUNNEL RELEASE Right 1990's   JOINT REPLACEMENT     RT HIP  REPLACEMENT   KNEE ARTHROPLASTY Right 03/20/2017   Procedure: RIGHT TOTAL KNEE ARTHROPLASTY WITH COMPUTER NAVIGATION;  Surgeon: Rod Can, MD;  Location: [place];  Service: Orthopedics;  Laterality: Right;  Needs RNFA   KNEE ARTHROSCOPY Right 1990's   LAPAROSCOPIC CHOLECYSTECTOMY  1990's   LUMBAR WOUND DEBRIDEMENT  04/11/2012   Procedure: LUMBAR WOUND DEBRIDEMENT;  Surgeon: Eustace Moore, MD;  Location: [place] [facility];  Service: Neurosurgery;  Laterality: N/A;  Irrigation and debridement of lumbar wound, Repair of pseudomeningocele not requiring laminectomy   POSTERIOR LUMBAR FUSION  04/06/2012   TOTAL HIP ARTHROPLASTY  2005; 2007   "right; left" (03/21/2017)   VAGINAL HYSTERECTOMY  1979    Prior to Admission medications   Medication Sig Start Date End Date Taking? Authorizing Provider  HYDROcodone-acetaminophen (NORCO/VICODIN) 5-325 MG tablet Take 1 tablet by mouth every 6 (six) hours as needed for moderate pain. 02/21/21  Yes Garima Chronis, Linden Dolin, PA-C  methocarbamol (ROBAXIN) 500 MG tablet Take 1 tablet (500 mg total) by mouth 3 (three) times daily. 02/21/21  Yes Marisel Tostenson, Linden Dolin, PA-C  amLODipine (NORVASC) 2.5 MG tablet Take 2.5 mg by mouth daily. 10/16/19   [provider]  aspirin 81 MG chewable tablet Chew 1 tablet (81 mg total)  by mouth 2 (two) times daily. 03/21/17   Swinteck, Aaron Edelman, MD  atorvastatin (LIPITOR) 80 MG tablet Take by mouth. 07/24/19   [provider]  carvedilol (COREG) 25 MG tablet Take by mouth. 07/24/19   [provider]  clopidogrel (PLAVIX) 75 MG tablet Take by mouth. 07/25/19   [provider]  gabapentin (NEURONTIN) 300 MG capsule Take 2 capsules (600 mg total) by mouth at bedtime. 12/22/17   Pleas Koch, NP  nortriptyline (PAMELOR) 75 MG capsule TAKE ONE CAPSULE BY MOUTH ONCE DAILY AT BEDTIME 12/22/17   Pleas Koch, NP  pravastatin (PRAVACHOL) 20 MG tablet TAKE 1 TABLET BY MOUTH AT BEDTIME 03/19/18   Pleas Koch, NP  sertraline (ZOLOFT) 50 MG tablet Take 1 tablet by mouth daily. 08/22/20   [provider]  Vibegron (GEMTESA) 75 MG TABS Take 75 mg by mouth daily. 01/25/21   Debroah Loop, PA-C    Allergies Other  Family History  Problem Relation Age of Onset   Heart attack Mother    Alzheimer's disease Mother    Hypertension Daughter    Breast cancer Neg Hx     Social History Social History   Tobacco Use   Smoking status: Former    Packs/day: 0.50    Years: 38.00    Pack years: 19.00    Types: Cigarettes    Quit date: 02/01/2017    Years since quitting: 4.0   Smokeless tobacco: Never  Vaping Use   Vaping Use: Never used  Substance Use Topics   Alcohol use: No    Alcohol/week: 0.0 standard drinks    Comment: 03/21/2017  "tried alcohol once; didn't like it; never tried it again"   Drug use: No    Review of Systems  Constitutional: No fever/chills Eyes: No visual changes. ENT: No sore throat. Respiratory: Denies cough Cardiovascular: Denies chest pain Gastrointestinal: Denies abdominal pain Genitourinary: Negative for dysuria. Musculoskeletal: Negative for back pain.  Positive for neck pain Skin: Negative for rash. Psychiatric: no mood changes,     ____________________________________________   PHYSICAL EXAM:  VITAL SIGNS: ED Triage Vitals  Enc Vitals Group     BP 02/21/21 1738 129/66     Pulse Rate 02/21/21 1738 67     Resp 02/21/21 1738 18     Temp 02/21/21 1738 98 F (36.7 C)     Temp src --      SpO2 02/21/21 1738 100 %     Weight --      Height --      Head Circumference --      Peak Flow --      Pain Score 02/21/21 1736 10     Pain Loc --      Pain Edu? --      Excl. in [place]? --     Constitutional: Alert  and oriented. Well appearing and in no acute distress. Eyes: Conjunctivae are normal.  Head: Atraumatic. Nose: No congestion/rhinnorhea. Mouth/Throat: Mucous membranes are moist.   Neck:  supple no lymphadenopathy noted Cardiovascular: Normal rate, regular rhythm. Respiratory: Normal respiratory effort.  No retractions GU: deferred Musculoskeletal: FROM all extremities, warm and well perfused, large spasm noted in the left trapezius/supraspinatus, pain is reproduced with movement of the neck and shoulder Neurologic:  Normal speech and language.  Grips equal bilaterally Skin:  Skin is warm, dry and intact. No rash noted. Psychiatric: Mood and affect are normal. Speech and behavior are normal.  ____________________________________________   LABS (all  labs ordered are listed, but only abnormal results are displayed)  Labs Reviewed - No data to display ____________________________________________   ____________________________________________  RADIOLOGY  X-rays C-spine  ____________________________________________   PROCEDURES  Procedure(s) performed: No  Procedures    ____________________________________________   INITIAL IMPRESSION / ASSESSMENT AND PLAN / ED COURSE  Pertinent labs & imaging results that were available during my care of the patient were reviewed by me and considered in my medical decision making (see chart for details).   Patient is a 75 year old female presents emergency department with neck pain.  See HPI.  Physical exam shows patient be stable.  This appears to be more musculoskeletal.  Patient does have reproduced pain with movement.  Therefore we will treat as a muscle spasm.  X-ray of the C-spine which was ordered from triage is reviewed by me and confirmed by radiology to be negative for any acute abnormality  I did discuss this result with the patient.  She was given Vicodin while here in the ED.  She is to obtain Robaxin and Vicodin tomorrow.   She is not to take tramadol with this medication.  She is in agreement with treatment plan.  Daughter is also in agreement treatment plan.  Patient was discharged stable condition.     Felicia Little was evaluated in Emergency Department on 02/21/2021 for the symptoms described in the history of present illness. She was evaluated in the context of the global COVID-19 pandemic, which necessitated consideration that the patient might be at risk for infection with the SARS-CoV-2 virus that causes COVID-19. Institutional protocols and algorithms that pertain to the evaluation of patients at risk for COVID-19 are in a state of rapid change based on information released by regulatory bodies including the CDC and federal and state organizations. These policies and algorithms were followed during the patient's care in the ED.    As part of my medical decision making, I reviewed the following data within the [facility] History obtained from family, Nursing notes reviewed and incorporated, Old chart reviewed, Radiograph reviewed , Notes from prior ED visits, and [place] Controlled Substance Database  ____________________________________________   FINAL CLINICAL IMPRESSION(S) / ED DIAGNOSES  Final diagnoses:  Torticollis      NEW MEDICATIONS STARTED DURING THIS VISIT:  New Prescriptions   HYDROCODONE-ACETAMINOPHEN (NORCO/VICODIN) 5-325 MG TABLET    Take 1 tablet by mouth every 6 (six) hours as needed for moderate pain.   METHOCARBAMOL (ROBAXIN) 500 MG TABLET    Take 1 tablet (500 mg total) by mouth 3 (three) times daily.     Note:  This document was prepared using Dragon voice recognition software and may include unintentional dictation errors.    Versie Starks, PA-C 02/21/21 1918    Merlyn Lot, MD 02/21/21 [phone]

## 2021-04-25 IMAGING — MG DIGITAL SCREENING BILATERAL MAMMOGRAM WITH TOMO AND CAD
8 series · 9 of 24 positions shown · non-contrast
Comparison: Previous exam(s).

CLINICAL DATA: Screening.

EXAM:
DIGITAL SCREENING BILATERAL MAMMOGRAM WITH TOMO AND CAD

[R MLO synth-2D]
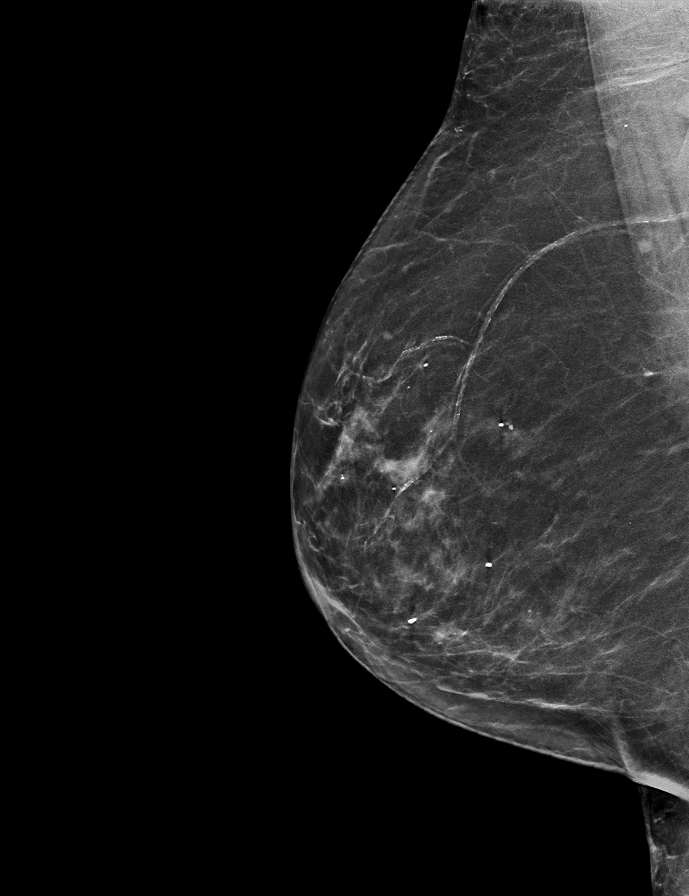

[R CC synth-2D]
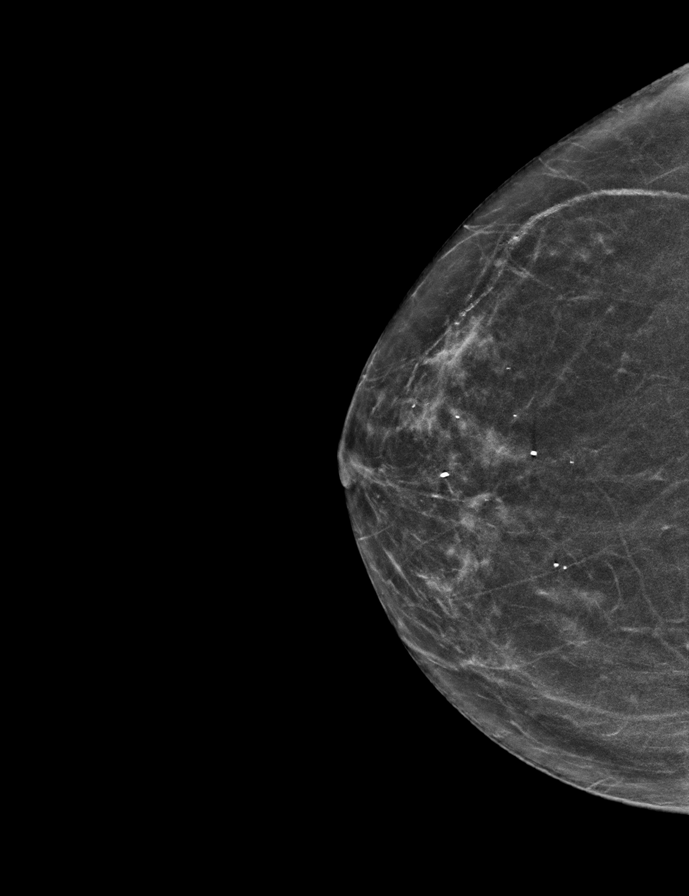

[L CC synth-2D]
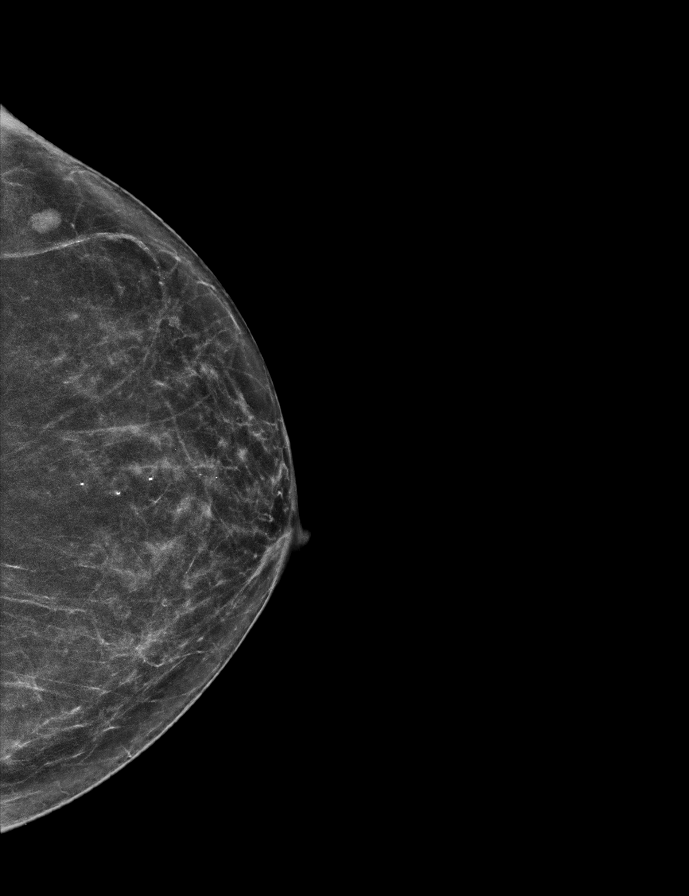

[L MLO synth-2D]
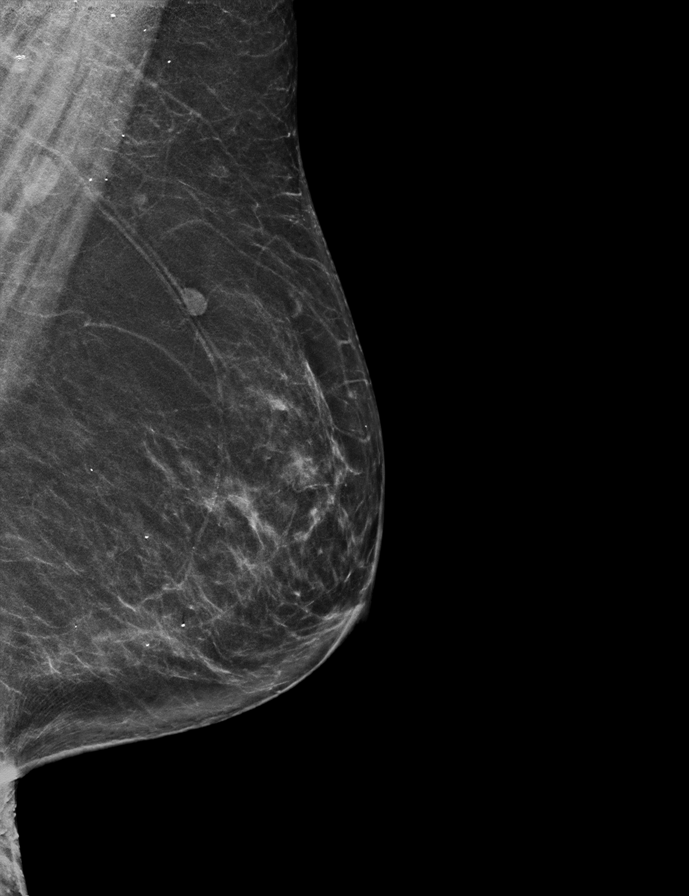

[L CC tomo · 2 of 59 frames shown]
[frame 20/59]
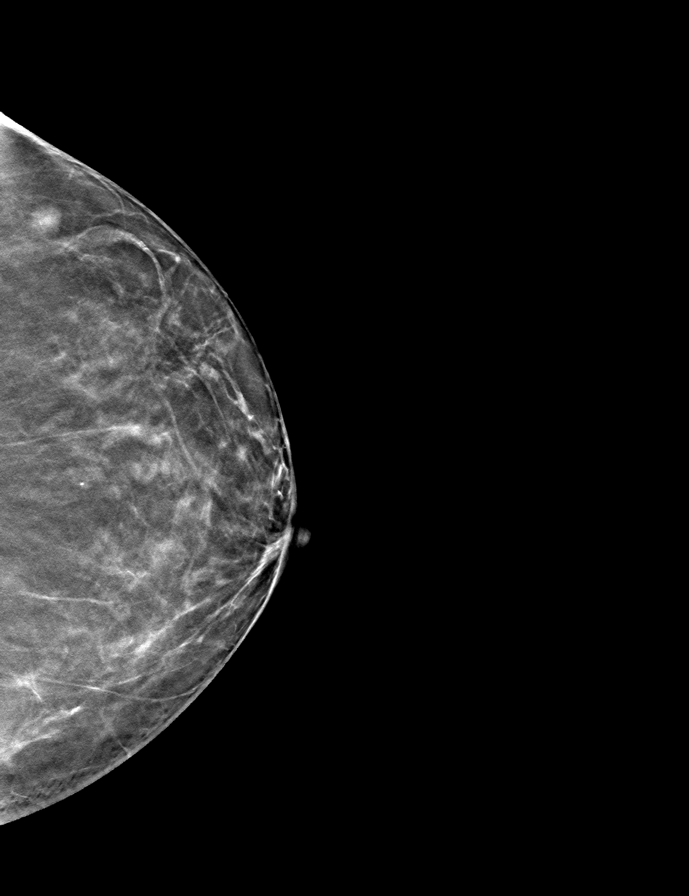
[frame 30/59]
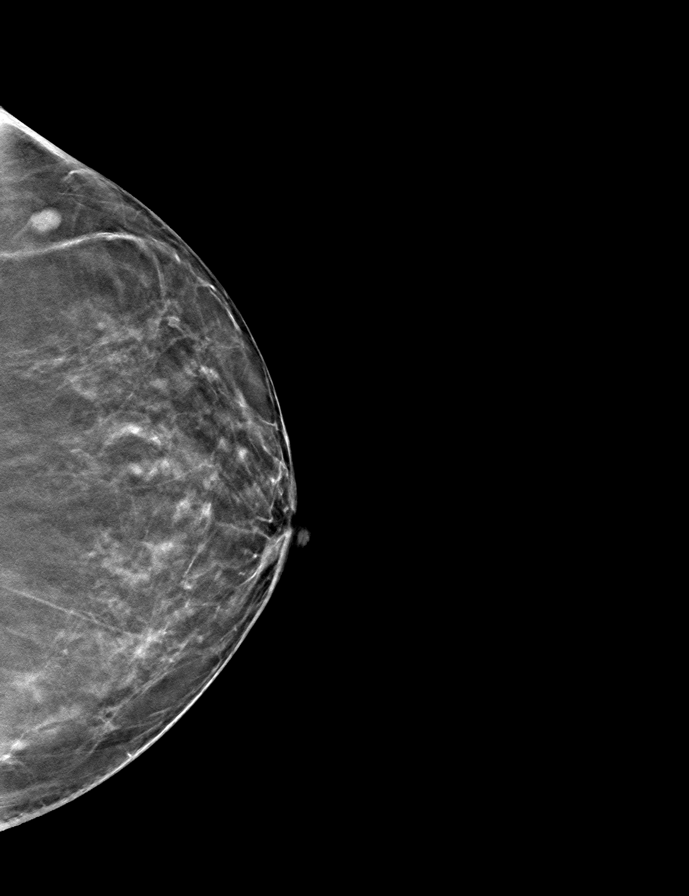

[R CC tomo · tomo slice 29/57.0]
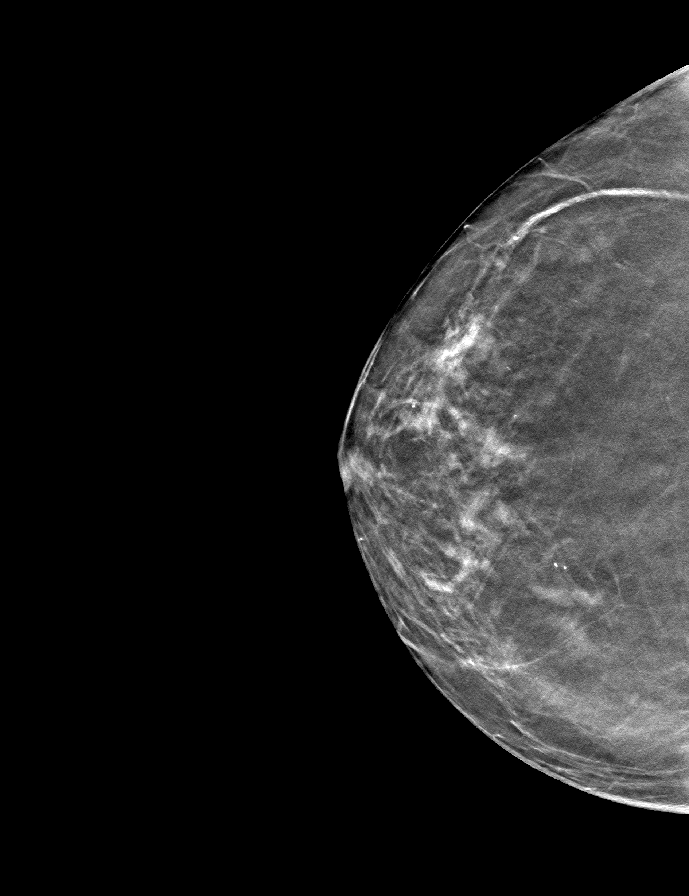

[R MLO tomo · tomo slice 33/64.0]
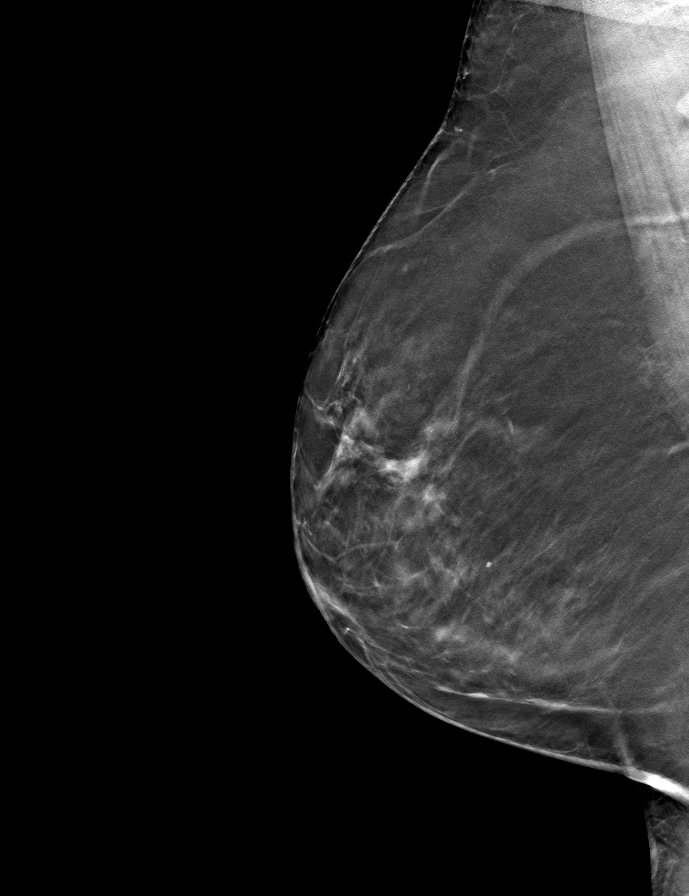

[L MLO tomo · tomo slice 33/66.0]
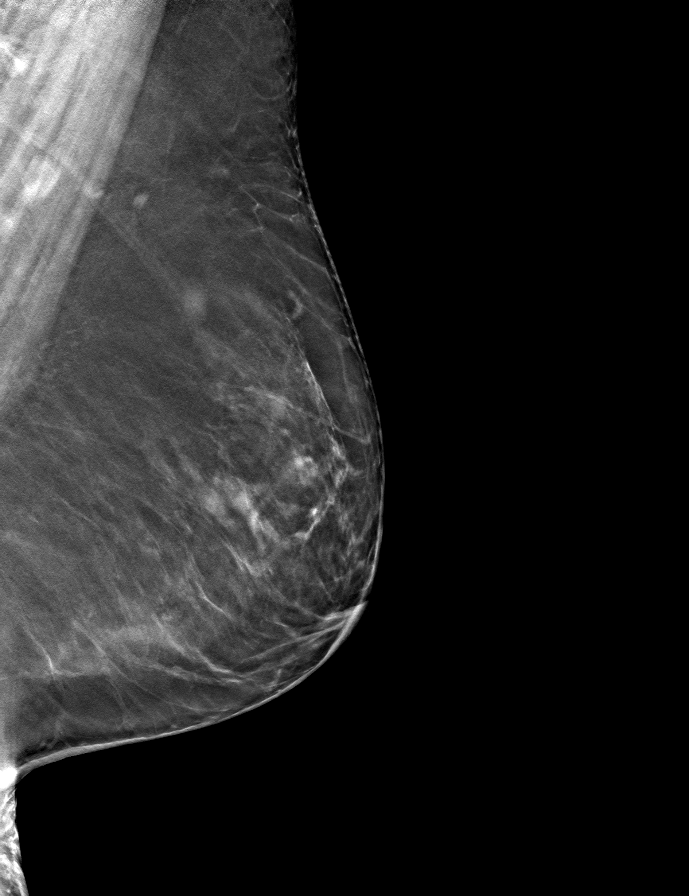

[9 of 24 positions shown; findings below may reference images not displayed]

ACR Breast Density Category b: There are scattered areas of
fibroglandular density.
FINDINGS: There are no findings suspicious for malignancy. Images were
processed with CAD.
IMPRESSION: No mammographic evidence of malignancy. A result letter of this
screening mammogram will be mailed directly to the patient.

RECOMMENDATION:
Screening mammogram in one year. (Code:[id number])

BI-RADS CATEGORY  1: Negative.

## 2021-07-06 ENCOUNTER — Ambulatory Visit: Payer: Medicare Other | Admitting: Podiatry

## 2021-07-06 DIAGNOSIS — B351 Tinea unguium: Secondary | ICD-10-CM

## 2021-07-06 DIAGNOSIS — Z79899 Other long term (current) drug therapy: Secondary | ICD-10-CM

## 2021-07-07 LAB — HEPATIC FUNCTION PANEL
ALT: 5 IU/L (ref 0–32)
AST: 12 IU/L (ref 0–40)
Albumin: 4.2 g/dL (ref 3.7–4.7)
Alkaline Phosphatase: 81 IU/L (ref 44–121)
Bilirubin Total: 0.4 mg/dL (ref 0.0–1.2)
Bilirubin, Direct: <0.1 mg/dL (ref 0.00–0.40)
Total Protein: 6.6 g/dL (ref 6.0–8.5)

## 2021-07-07 MED ORDER — TERBINAFINE HCL 250 MG PO TABS: 250.0000 mg | ORAL_TABLET | Freq: Every day | ORAL | 0 refills | Status: DC

## 2021-07-07 NOTE — Progress Notes (Signed)
?Subjective:  ?Patient ID: Felicia Little, female    DOB: 09-10-1945,  MRN: [id number] ? ?No chief complaint on file. ? ? ?76 y.o. female presents with the above complaint.  Patient presents with thickened elongated dystrophic toenails x10.  She would like to discuss treatment options for this.  She states been bothering her for quite some time is progressive gotten worse.  She is tried over-the-counter medication none of which has helped.  She does not have any liver issues.  She wanted to discuss oral medication. ? ? ?Review of Systems: Negative except as noted in the HPI. Denies N/V/F/Ch. ? ?Past Medical History:  ?Diagnosis Date  ? Anxiety   ? Arthritis   ? "all over" (03/21/2017)  ? Chronic lower back pain   ? Chronic prescription benzodiazepine use 09/03/2015  ? Depression   ? Headache(784.0)   ? "maybe once/week" (04/06/2012) - none recently  ? High cholesterol   ? History of blood transfusion 2007  ? "related to my 2nd hip OR" (03/21/2017)  ? History of kidney stones   ? Hx of smoking 10/22/2014  ? Hypertension   ? Pneumonia ~ 1995; 1996  ? Renal insufficiency 09/03/2015  ? ? ?Current Outpatient Medications:  ?  terbinafine (LAMISIL) 250 MG tablet, Take 1 tablet (250 mg total) by mouth daily., Disp: 90 tablet, Rfl: 0 ?  amLODipine (NORVASC) 2.5 MG tablet, Take 2.5 mg by mouth daily., Disp: , Rfl:  ?  aspirin 81 MG chewable tablet, Chew 1 tablet (81 mg total) by mouth 2 (two) times daily., Disp: 60 tablet, Rfl: 1 ?  atorvastatin (LIPITOR) 80 MG tablet, Take by mouth., Disp: , Rfl:  ?  carvedilol (COREG) 25 MG tablet, Take by mouth., Disp: , Rfl:  ?  clopidogrel (PLAVIX) 75 MG tablet, Take by mouth., Disp: , Rfl:  ?  gabapentin (NEURONTIN) 300 MG capsule, Take 2 capsules (600 mg total) by mouth at bedtime., Disp: 180 capsule, Rfl: 3 ?  HYDROcodone-acetaminophen (NORCO/VICODIN) 5-325 MG tablet, Take 1 tablet by mouth every 6 (six) hours as needed for moderate pain., Disp: 15 tablet, Rfl: 0 ?  methocarbamol (ROBAXIN) 500  MG tablet, Take 1 tablet (500 mg total) by mouth 3 (three) times daily., Disp: 28 tablet, Rfl: 0 ?  nortriptyline (PAMELOR) 75 MG capsule, TAKE ONE CAPSULE BY MOUTH ONCE DAILY AT BEDTIME, Disp: 90 capsule, Rfl: 3 ?  pravastatin (PRAVACHOL) 20 MG tablet, TAKE 1 TABLET BY MOUTH AT BEDTIME, Disp: 90 tablet, Rfl: 1 ?  sertraline (ZOLOFT) 50 MG tablet, Take 1 tablet by mouth daily., Disp: , Rfl:  ?  Vibegron (GEMTESA) 75 MG TABS, Take 75 mg by mouth daily., Disp: 30 tablet, Rfl: 11 ? ?Social History  ? ?Tobacco Use  ?Smoking Status Former  ? Packs/day: 0.50  ? Years: 38.00  ? Pack years: 19.00  ? Types: Cigarettes  ? Quit date: 02/01/2017  ? Years since quitting: 4.4  ?Smokeless Tobacco Never  ? ? ?Allergies  ?Allergen Reactions  ? Other Swelling  ?  VARIOUS METALS cause internal swelling  ? ?Objective:  ?There were no vitals filed for this visit. ?There is no height or weight on file to calculate BMI. ?Constitutional Well developed. ?Well nourished.  ?Vascular Dorsalis pedis pulses palpable bilaterally. ?Posterior tibial pulses palpable bilaterally. ?Capillary refill normal to all digits.  ?No cyanosis or clubbing noted. ?Pedal hair growth normal.  ?Neurologic Normal speech. ?Oriented to person, place, and time. ?Epicritic sensation to light touch grossly present bilaterally.  ?  Dermatologic Nails thickened elongated dystrophic toenails x10.  Mild pain on palpation.  Mycotic nature to work ?Skin within normal limits  ?Orthopedic: Normal joint ROM without pain or crepitus bilaterally. ?No visible deformities. ?No bony tenderness.  ? ?Radiographs: None ?Assessment:  ? ?1. Long-term use of high-risk medication   ?2. Nail fungus   ?3. Onychomycosis due to dermatophyte   ? ?Plan:  ?Patient was evaluated and treated and all questions answered. ? ?Onychomycosis toenails x10 ?-Educated the patient on the etiology of onychomycosis and various treatment options associated with improving the fungal load.  I explained to the patient  that there is 3 treatment options available to treat the onychomycosis including topical, p.o., laser treatment.  Patient elected to undergo p.o. options with Lamisil/terbinafine therapy.  In order for me to start the medication therapy, I explained to the patient the importance of evaluating the liver and obtaining the liver function test.  Once the liver function test comes back normal I will start him on 4-monthcourse of Lamisil therapy.  Patient understood all risk and would like to proceed with Lamisil therapy.  I have asked the patient to immediately stop the Lamisil therapy if she has any reactions to it and call the office or go to the emergency room right away.  Patient states understanding ? ? ?No follow-ups on file. ?

## 2021-09-08 ENCOUNTER — Other Ambulatory Visit: Payer: Self-pay | Admitting: Family Medicine

## 2021-09-08 DIAGNOSIS — Z1231 Encounter for screening mammogram for malignant neoplasm of breast: Secondary | ICD-10-CM

## 2021-10-18 ENCOUNTER — Ambulatory Visit
Admission: RE | Admit: 2021-10-18 | Discharge: 2021-10-18 | Disposition: A | Discharge status: Home / Self Care | Payer: Medicare Other | Source: Ambulatory Visit | Attending: Family Medicine | Admitting: Family Medicine

## 2021-10-18 DIAGNOSIS — Z1231 Encounter for screening mammogram for malignant neoplasm of breast: Secondary | ICD-10-CM | POA: Insufficient documentation

## 2021-12-17 ENCOUNTER — Other Ambulatory Visit: Payer: Self-pay | Admitting: Family Medicine

## 2021-12-17 DIAGNOSIS — R413 Other amnesia: Secondary | ICD-10-CM

## 2022-01-11 ENCOUNTER — Other Ambulatory Visit: Payer: Medicare Other

## 2022-01-25 ENCOUNTER — Ambulatory Visit
Admission: RE | Admit: 2022-01-25 | Discharge: 2022-01-25 | Disposition: A | Discharge status: Home / Self Care | Payer: Medicare Other | Source: Ambulatory Visit | Attending: Family Medicine | Admitting: Family Medicine

## 2022-01-25 ENCOUNTER — Other Ambulatory Visit: Payer: Self-pay | Admitting: Family Medicine

## 2022-01-25 ENCOUNTER — Ambulatory Visit: Payer: Medicare Other | Admitting: Physician Assistant

## 2022-01-25 DIAGNOSIS — R413 Other amnesia: Secondary | ICD-10-CM

## 2022-01-26 ENCOUNTER — Encounter: Payer: Self-pay | Admitting: Physician Assistant

## 2022-03-10 ENCOUNTER — Ambulatory Visit: Payer: Medicare Other | Admitting: Podiatry

## 2022-03-16 ENCOUNTER — Ambulatory Visit: Payer: Medicare Other | Admitting: Podiatry

## 2022-03-16 DIAGNOSIS — Z7902 Long term (current) use of antithrombotics/antiplatelets: Secondary | ICD-10-CM

## 2022-03-16 DIAGNOSIS — M79674 Pain in right toe(s): Secondary | ICD-10-CM

## 2022-03-16 DIAGNOSIS — B351 Tinea unguium: Secondary | ICD-10-CM

## 2022-03-16 DIAGNOSIS — I739 Peripheral vascular disease, unspecified: Secondary | ICD-10-CM | POA: Diagnosis not present

## 2022-03-16 DIAGNOSIS — M79675 Pain in left toe(s): Secondary | ICD-10-CM

## 2022-03-16 DIAGNOSIS — L84 Corns and callosities: Secondary | ICD-10-CM | POA: Diagnosis not present

## 2022-03-17 NOTE — Progress Notes (Signed)
  Subjective:  Patient ID: Felicia Little, female    DOB: 1945-06-28,  MRN: [id number]  Chief Complaint  Patient presents with   Callouses    hard calluses-painful   Nail Problem    Thick painful toenails    76 y.o. female presents with the above complaint. History confirmed with patient.  There are calluses under both feet.  She is on Plavix.  Her nails are thickened elongated and she is unable to cut them.  Objective:  Physical Exam: warm, good capillary refill, no trophic changes or ulcerative lesions, weakly palpable DP and PT pulses, and normal sensory exam.  Varicose veins bilaterally Left Foot: dystrophic yellowed discolored nail plates with subungual debris and submetatarsal 5 callus Right Foot: dystrophic yellowed discolored nail plates with subungual debris and submetatarsal 5 callus   Assessment:   1. Pain due to onychomycosis of toenails of both feet   2. Callus of foot   3. PVD (peripheral vascular disease) ([place])   4. Antiplatelet or antithrombotic long-term use      Plan:  Patient was evaluated and treated and all questions answered.  Discussed the etiology and treatment options for the condition in detail with the patient. Educated patient on the topical and oral treatment options for mycotic nails. Recommended debridement of the nails today. Sharp and mechanical debridement performed of all painful and mycotic nails today. Nails debrided in length and thickness using a nail nipper to level of comfort. Discussed treatment options including appropriate shoe gear. Follow up as needed for painful nails.   All symptomatic hyperkeratoses were safely debrided with a sterile #15 blade to patient's level of comfort without incident. We discussed preventative and palliative care of these lesions including supportive and accommodative shoegear, padding, prefabricated and custom molded accommodative orthoses, use of a pumice stone and lotions/creams daily.   Return if symptoms  worsen or fail to improve.

## 2022-08-11 ENCOUNTER — Ambulatory Visit: Payer: Medicare Other | Admitting: Urology

## 2022-08-16 ENCOUNTER — Ambulatory Visit: Payer: Medicare Other | Admitting: Physician Assistant

## 2022-08-16 DIAGNOSIS — N3281 Overactive bladder: Secondary | ICD-10-CM

## 2022-08-16 DIAGNOSIS — R351 Nocturia: Secondary | ICD-10-CM | POA: Diagnosis not present

## 2022-08-16 LAB — BLADDER SCAN AMB NON-IMAGING: Scan Result: 22

## 2022-08-16 MED ORDER — TROSPIUM CHLORIDE 20 MG PO TABS: 20.0000 mg | ORAL_TABLET | Freq: Every day | ORAL | 0 refills | Status: DC

## 2022-08-16 MED ORDER — TROSPIUM CHLORIDE 20 MG PO TABS: 20.0000 mg | ORAL_TABLET | Freq: Every day | ORAL | 0 refills | Status: AC

## 2022-08-16 NOTE — Progress Notes (Unsigned)
08/16/2022 5:16 PM   Felicia Little [birth year] [id number]  CC: Chief Complaint  Little presents with   Follow-up   HPI: Felicia Little is a 77 y.o. female with PMH dementia, Bosniak 1 renal cyst, OAB wet and dry mouth who previously failed Myrbetriq and declined third line therapies now on Gemtesa who presents today for follow-up of Felicia Little OAB symptoms.  She is accompanied today by Felicia Little daughter, who contributes to HPI.   Today she reports she no longer feels Felicia Little is working.  She was taking it twice daily for a while and still did not notice a difference.  She describes daytime frequency and hourly nocturia that is interfering with Felicia Little sleep quality.  She has had ongoing urge incontinence and wears 6 depends daily, which are wet.  She denies dysuria or gross hematuria.  Felicia Little daughter reports that over Felicia past year she has had increased frequency of falls and reduced mobility.  They are curious about a PureWick device or other treatment options.  She is curious about oxybutynin.  She saw Dr. Malvin Johns at [facility] in March.  She has had multiple rounds of brain imaging over Felicia past year associated with Felicia Little falls and there have been no reports of concern for normal pressure hydrocephalus.  PMH: Past Medical History:  Diagnosis Date   Anxiety    Arthritis    "all over" (03/21/2017)   Chronic lower back pain    Chronic prescription benzodiazepine use 09/03/2015   Depression    Headache(784.0)    "maybe once/week" (04/06/2012) - none recently   High cholesterol    History of blood transfusion 2007   "related to my 2nd hip OR" (03/21/2017)   History of kidney stones    Hx of smoking 10/22/2014   Hypertension    Pneumonia ~ 1995; 1996   Renal insufficiency 09/03/2015    Surgical History: Past Surgical History:  Procedure Laterality Date   ANTERIOR HIP REVISION Right 01/26/2015   Procedure: REVISION RIGHT TOTAL HIP ARTHROPLASTY ACETABULAR COMPONENTS  ANTERIOR  APPROACH;  Surgeon: Samson Frederic, MD;  Location: [facility] OR;  Service: Orthopedics;  Laterality: Right;   BACK SURGERY     CARPAL TUNNEL RELEASE Right 1990's   JOINT REPLACEMENT     RT HIP  REPLACEMENT   KNEE ARTHROPLASTY Right 03/20/2017   Procedure: RIGHT TOTAL KNEE ARTHROPLASTY WITH COMPUTER NAVIGATION;  Surgeon: Samson Frederic, MD;  Location: [facility] OR;  Service: Orthopedics;  Laterality: Right;  Needs RNFA   KNEE ARTHROSCOPY Right 1990's   LAPAROSCOPIC CHOLECYSTECTOMY  1990's   LUMBAR WOUND DEBRIDEMENT  04/11/2012   Procedure: LUMBAR WOUND DEBRIDEMENT;  Surgeon: Tia Alert, MD;  Location: [facility] NEURO ORS;  Service: Neurosurgery;  Laterality: N/A;  Irrigation and debridement of lumbar wound, Repair of pseudomeningocele not requiring laminectomy   POSTERIOR LUMBAR FUSION  04/06/2012   TOTAL HIP ARTHROPLASTY  2005; 2007   "right; left" (03/21/2017)   VAGINAL HYSTERECTOMY  1979    Home Medications:  Allergies as of 08/16/2022       Reactions   Other Swelling   VARIOUS METALS cause internal swelling        Medication List        Accurate as of Aug 16, 2022  5:16 PM. If you have any questions, ask your nurse or doctor.          STOP taking these medications    Gemtesa 75 MG Tabs Generic drug: Vibegron  TAKE these medications    amLODipine 2.5 MG tablet Commonly known as: NORVASC Take 2.5 mg by mouth daily.   aspirin 81 MG chewable tablet Chew 1 tablet (81 mg total) by mouth 2 (two) times daily.   atorvastatin 80 MG tablet Commonly known as: LIPITOR Take by mouth.   carvedilol 25 MG tablet Commonly known as: COREG Take by mouth.   clopidogrel 75 MG tablet Commonly known as: PLAVIX Take by mouth.   gabapentin 300 MG capsule Commonly known as: NEURONTIN Take 2 capsules (600 mg total) by mouth at bedtime.   HYDROcodone-acetaminophen 5-325 MG tablet Commonly known as: NORCO/VICODIN Take 1 tablet by mouth every 6 (six) hours as needed for moderate pain.    methocarbamol 500 MG tablet Commonly known as: Robaxin Take 1 tablet (500 mg total) by mouth 3 (three) times daily.   nortriptyline 75 MG capsule Commonly known as: PAMELOR TAKE ONE CAPSULE BY MOUTH ONCE DAILY AT BEDTIME   pravastatin 20 MG tablet Commonly known as: PRAVACHOL TAKE 1 TABLET BY MOUTH AT BEDTIME   sertraline 50 MG tablet Commonly known as: ZOLOFT Take 1 tablet by mouth daily.   terbinafine 250 MG tablet Commonly known as: LamISIL Take 1 tablet (250 mg total) by mouth daily.   trospium 20 MG tablet Commonly known as: SANCTURA Take 1 tablet (20 mg total) by mouth daily.        Allergies:  Allergies  Allergen Reactions   Other Swelling    VARIOUS METALS cause internal swelling    Family History: Family History  Problem Relation Age of Onset   Heart attack Mother    Alzheimer's disease Mother    Hypertension Daughter    Breast cancer Neg Hx     Social History:   reports that she quit smoking about 5 years ago. Felicia Little smoking use included cigarettes. She has a 19.00 pack-year smoking history. She has never used smokeless tobacco. She reports that she does not drink alcohol and does not use drugs.  Physical Exam: There were no vitals taken for this visit.  Constitutional:  Alert, no acute distress, nontoxic appearing HEENT: [place], AT Cardiovascular: No clubbing, cyanosis, or edema Respiratory: Normal respiratory effort, no increased work of breathing Skin: No rashes, bruises or suspicious lesions Neurologic: Grossly intact, no focal deficits, moving all 4 extremities Psychiatric: Normal mood and affect  Laboratory Data: Results for orders placed or performed in visit on 08/16/22  BLADDER SCAN AMB NON-IMAGING  Result Value Ref Range   Scan Result 22 ml    Assessment & Plan:   1. OAB (overactive bladder) Decreased therapeutic response on Gemtesa after having failed Myrbetriq.  She is emptying appropriately.  I had a lengthy and very frank  conversation with Felicia Little and Felicia Little daughter today that Felicia Little urinary symptoms are going to be very challenging to manage since she has failed multiple medications.  We discussed that I do not recommend oxybutynin due to Felicia Little dementia, as this will likely worsen Felicia Little cognitive status.  We discussed alternative options including trospium, which would be Felicia only available antimuscarinic that does not cross Felicia blood-brain barrier, as well as third line therapies including PTNS and Botox.  I do not think she will be a good surgical candidate for InterStim.  Felicia Little daughter feels that she is not likely to want to come to clinic for PTNS and she would likely not be able to self cath with Botox if she develops incomplete bladder emptying.  They are willing to  try trospium, so we will try 1 month of this med and see Felicia Little back for symptom recheck.  If she fails trospium, we also discussed consideration of a chronic indwelling Foley catheter for urinary management.  We discussed that Felicia risks of this would include uncomfortable bladder spasms, urinary leakage around Felicia catheter tubing, and urinary infection, though it may give Felicia Little better bladder management options and potentially reduce Felicia Little risk for falls in Felicia middle of Felicia night.  Lastly, I will reach out to Dr. Malvin Johns to get his thoughts on possible normal pressure hydrocephalus given Felicia Little worsening cognitive status, increased falls, and worsening urinary incontinence over Felicia past year. - BLADDER SCAN AMB NON-IMAGING - trospium (SANCTURA) 20 MG tablet; Take 1 tablet (20 mg total) by mouth daily.  Dispense: 30 tablet; Refill: 0  2. Nocturia We discussed that nocturia is frequently a symptom of other medical problems and rarely a true urologic issue.  I do agree that Felicia use of a PureWick may help Felicia Little avoid getting up in Felicia middle of Felicia night, however we discussed that these are cost prohibitive for many patients.  Regardless, I did give them Felicia link to order  this online if they choose to do so.  I do not recommend desmopressin given Felicia Little frailty.  Return in about 4 weeks (around 09/13/2022) for Symptom recheck with PVR.  Carman Ching, PA-C  [facility] [address], [address] [place], [place] [address] [phone]

## 2022-08-16 NOTE — Patient Instructions (Signed)
[email or web address]  

## 2022-08-18 ENCOUNTER — Telehealth: Payer: Self-pay | Admitting: Physician Assistant

## 2022-08-18 NOTE — Telephone Encounter (Signed)
I spoke with Dr. Daisy Blossom team via telephone this morning to get their thoughts on possible NPH, see my last note. We agree that there is no evidence of ventriculomegaly on her most recent brain MR, however they will reach out to her to have her seen in clinic sooner than her planned visit in September for further evaluation of this.

## 2022-09-12 ENCOUNTER — Ambulatory Visit: Payer: Medicare Other | Admitting: Podiatry

## 2022-09-12 DIAGNOSIS — L84 Corns and callosities: Secondary | ICD-10-CM

## 2022-09-12 DIAGNOSIS — I739 Peripheral vascular disease, unspecified: Secondary | ICD-10-CM | POA: Diagnosis not present

## 2022-09-12 DIAGNOSIS — M79674 Pain in right toe(s): Secondary | ICD-10-CM

## 2022-09-12 DIAGNOSIS — M79675 Pain in left toe(s): Secondary | ICD-10-CM | POA: Diagnosis not present

## 2022-09-12 DIAGNOSIS — B351 Tinea unguium: Secondary | ICD-10-CM

## 2022-09-13 NOTE — Progress Notes (Signed)
  Subjective:  Patient ID: Felicia Little, female    DOB: October 17, 1945,  MRN: [id number]  Chief Complaint  Patient presents with   Nail Problem    Overdue nail trim    77 y.o. female presents with the above complaint. History confirmed with patient.  There are calluses under both feet.  She is on Plavix.  Her nails are thickened elongated and she is unable to cut them.  Objective:  Physical Exam: warm, good capillary refill, no trophic changes or ulcerative lesions, weakly palpable DP and PT pulses, and normal sensory exam.  Varicose veins bilaterally Left Foot: dystrophic yellowed discolored nail plates with subungual debris and submetatarsal 5 callus Right Foot: dystrophic yellowed discolored nail plates with subungual debris and submetatarsal 5 callus   Assessment:   1. Pain due to onychomycosis of toenails of both feet   2. PVD (peripheral vascular disease) ([facility])   3. Callus of foot      Plan:  Patient was evaluated and treated and all questions answered.  Discussed the etiology and treatment options for the condition in detail with the patient.  Prior debridement of nails has been helpful in improving pain and reducing thickness of nails. Recommended debridement of the nails today. Sharp and mechanical debridement performed of all painful and mycotic nails today. Nails debrided in length and thickness using a nail nipper to level of comfort. Discussed treatment options including appropriate shoe gear. Follow up as needed for painful nails.   All symptomatic hyperkeratoses were safely debrided with a sterile #15 blade to patient's level of comfort without incident. We discussed preventative and palliative care of these lesions including supportive and accommodative shoegear, padding, prefabricated and custom molded accommodative orthoses, use of a pumice stone and lotions/creams daily.   Return in 3 months (on 12/13/2022), or if symptoms worsen or fail to improve, for nail and callus  trim w/Dr Eloy End.

## 2022-09-16 ENCOUNTER — Encounter: Payer: Self-pay | Admitting: Physician Assistant

## 2022-09-16 ENCOUNTER — Ambulatory Visit: Payer: Medicare Other | Admitting: Physician Assistant

## 2022-09-16 VITALS — BP 128/70 | HR 72 | Ht 65.0 in | Wt 168.0 lb

## 2022-09-16 DIAGNOSIS — N3281 Overactive bladder: Secondary | ICD-10-CM | POA: Diagnosis not present

## 2022-09-16 LAB — BLADDER SCAN AMB NON-IMAGING: Scan Result: 18

## 2022-09-16 MED ORDER — GEMTESA 75 MG PO TABS: 75.0000 mg | ORAL_TABLET | Freq: Every day | ORAL | 0 refills | Status: DC

## 2022-09-16 MED ORDER — TROSPIUM CHLORIDE 20 MG PO TABS: 20.0000 mg | ORAL_TABLET | Freq: Every day | ORAL | 1 refills | Status: AC

## 2022-09-16 NOTE — Progress Notes (Addendum)
09/16/2022 3:39 PM   Felicia Little Post 03/05/1946 [id number]  CC: Chief Complaint  Patient presents with   Over Active Bladder   HPI: Felicia Little is a 77 y.o. female with PMH dementia, Bosniak 1 renal cyst, OAB wet and dry mouth who previously failed beta 3 agonists and declined third line therapies now on trospium who presents today for symptom recheck.  She is accompanied today by her daughter, who contributes to HPI.   Since they last saw me, they followed up with Dr. Malvin Johns, who was not concerned for NPH.  Today she reports the trospium made no change in her urinary symptoms.  She remains mostly bothered by urgency/frequency.  She denies vaginal bulging or pressure.  PVR 18 mL.  PMH: Past Medical History:  Diagnosis Date   Anxiety    Arthritis    "all over" (03/21/2017)   Chronic lower back pain    Chronic prescription benzodiazepine use 09/03/2015   Depression    Headache(784.0)    "maybe once/week" (04/06/2012) - none recently   High cholesterol    History of blood transfusion 2007   "related to my 2nd hip OR" (03/21/2017)   History of kidney stones    Hx of smoking 10/22/2014   Hypertension    Pneumonia ~ 1995; 1996   Renal insufficiency 09/03/2015    Surgical History: Past Surgical History:  Procedure Laterality Date   ANTERIOR HIP REVISION Right 01/26/2015   Procedure: REVISION RIGHT TOTAL HIP ARTHROPLASTY ACETABULAR COMPONENTS  ANTERIOR APPROACH;  Surgeon: Samson Frederic, MD;  Location: [facility] OR;  Service: Orthopedics;  Laterality: Right;   BACK SURGERY     CARPAL TUNNEL RELEASE Right 1990's   JOINT REPLACEMENT     RT HIP  REPLACEMENT   KNEE ARTHROPLASTY Right 03/20/2017   Procedure: RIGHT TOTAL KNEE ARTHROPLASTY WITH COMPUTER NAVIGATION;  Surgeon: Samson Frederic, MD;  Location: [facility] OR;  Service: Orthopedics;  Laterality: Right;  Needs RNFA   KNEE ARTHROSCOPY Right 1990's   LAPAROSCOPIC CHOLECYSTECTOMY  1990's   LUMBAR WOUND DEBRIDEMENT  04/11/2012   Procedure:  LUMBAR WOUND DEBRIDEMENT;  Surgeon: Tia Alert, MD;  Location: [facility];  Service: Neurosurgery;  Laterality: N/A;  Irrigation and debridement of lumbar wound, Repair of pseudomeningocele not requiring laminectomy   POSTERIOR LUMBAR FUSION  04/06/2012   TOTAL HIP ARTHROPLASTY  2005; 2007   "right; left" (03/21/2017)   VAGINAL HYSTERECTOMY  1979    Home Medications:  Allergies as of 09/16/2022       Reactions   Other Swelling   VARIOUS METALS cause internal swelling        Medication List        Accurate as of September 16, 2022  3:39 PM. If you have any questions, ask your nurse or doctor.          acetaminophen 500 MG tablet Commonly known as: TYLENOL Take by mouth.   amLODipine 2.5 MG tablet Commonly known as: NORVASC Take 2.5 mg by mouth daily.   aspirin 81 MG chewable tablet Chew 1 tablet (81 mg total) by mouth 2 (two) times daily.   atorvastatin 80 MG tablet Commonly known as: LIPITOR Take by mouth.   carvedilol 25 MG tablet Commonly known as: COREG Take by mouth.   clopidogrel 75 MG tablet Commonly known as: PLAVIX Take by mouth.   donepezil 10 MG tablet Commonly known as: ARICEPT Take 1 tablet by mouth at bedtime.   DULoxetine 20 MG capsule Commonly known as:  CYMBALTA Take by mouth.   gabapentin 300 MG capsule Commonly known as: NEURONTIN Take 2 capsules (600 mg total) by mouth at bedtime.   HYDROcodone-acetaminophen 5-325 MG tablet Commonly known as: NORCO/VICODIN Take 1 tablet by mouth every 6 (six) hours as needed for moderate pain.   methocarbamol 500 MG tablet Commonly known as: Robaxin Take 1 tablet (500 mg total) by mouth 3 (three) times daily.   nortriptyline 75 MG capsule Commonly known as: PAMELOR TAKE ONE CAPSULE BY MOUTH ONCE DAILY AT BEDTIME   oxyCODONE-acetaminophen 5-325 MG tablet Commonly known as: PERCOCET/ROXICET   pravastatin 20 MG tablet Commonly known as: PRAVACHOL TAKE 1 TABLET BY MOUTH AT BEDTIME    predniSONE 5 MG tablet Commonly known as: DELTASONE See admin instructions.   pregabalin 75 MG capsule Commonly known as: LYRICA Take 75 mg by mouth 2 (two) times daily.   sertraline 50 MG tablet Commonly known as: ZOLOFT Take 1 tablet by mouth daily.   terbinafine 250 MG tablet Commonly known as: LamISIL Take 1 tablet (250 mg total) by mouth daily.   traMADol-acetaminophen 37.5-325 MG tablet Commonly known as: ULTRACET Take 1 tablet by mouth every 8 (eight) hours as needed.   trospium 20 MG tablet Commonly known as: SANCTURA Take 1 tablet (20 mg total) by mouth daily. Started by: Carman Ching, PA-C        Allergies:  Allergies  Allergen Reactions   Other Swelling    VARIOUS METALS cause internal swelling    Family History: Family History  Problem Relation Age of Onset   Heart attack Mother    Alzheimer's disease Mother    Hypertension Daughter    Breast cancer Neg Hx     Social History:   reports that she quit smoking about 5 years ago. Her smoking use included cigarettes. She has a 19.00 pack-year smoking history. She has never used smokeless tobacco. She reports that she does not drink alcohol and does not use drugs.  Physical Exam: BP 128/70   Pulse 72   Ht 5\' 5"  (1.651 m)   Wt 168 lb (76.2 kg)   BMI 27.96 kg/m   Constitutional:  Alert and oriented, no acute distress, nontoxic appearing HEENT: [place], AT Cardiovascular: No clubbing, cyanosis, or edema Respiratory: Normal respiratory effort, no increased work of breathing Skin: No rashes, bruises or suspicious lesions Neurologic: Grossly intact, no focal deficits, moving all 4 extremities Psychiatric: Normal mood and affect  Laboratory Data: Results for orders placed or performed in visit on 09/16/22  BLADDER SCAN AMB NON-IMAGING  Result Value Ref Range   Scan Result 18    Assessment & Plan:   1. OAB (overactive bladder) She has failed trospium and beta 3 agonists alone but has not yet  tried combo therapy.  We again discussed third line therapies and she again reports she will be unable to come to clinic frequently enough for PTNS, she would be unable to self cath if she developed incomplete bladder emptying on Botox, and she does not wish to pursue surgery for InterStim.  We again discussed that PureWick is available for [employer], but is very expensive and insurance does not cover it.  We also again discussed chronic indwelling Foley catheter and the limitations of this including infection, urinary leakage, and bladder spasms.  Together we decided to try combination pharmacotherapy with [facility], since it seemed to help her previously, and trospium x 1 month.  If she fails combination pharmacotherapy, I was very frank with them that  unfortunately I will have nothing else to offer. - BLADDER SCAN AMB NON-IMAGING - trospium (SANCTURA) 20 MG tablet; Take 1 tablet (20 mg total) by mouth daily.  Dispense: 30 tablet; Refill: 1 - Vibegron (GEMTESA) 75 MG TABS; Take 1 tablet (75 mg total) by mouth daily.  Dispense: 28 tablet; Refill: 0   Return in about 4 weeks (around 10/14/2022) for Symptom recheck with PVR.  Carman Ching, PA-C  [facility] [place] [address] [place], [place] [address] [phone]

## 2022-10-07 ENCOUNTER — Emergency Department: Payer: Medicare Other

## 2022-10-07 ENCOUNTER — Emergency Department
Admission: EM | Admit: 2022-10-07 | Discharge: 2022-10-07 | Disposition: A | Discharge status: Home / Self Care | Payer: Medicare Other | Attending: Emergency Medicine | Admitting: Emergency Medicine

## 2022-10-07 ENCOUNTER — Other Ambulatory Visit: Payer: Self-pay

## 2022-10-07 DIAGNOSIS — I10 Essential (primary) hypertension: Secondary | ICD-10-CM | POA: Diagnosis not present

## 2022-10-07 DIAGNOSIS — W19XXXA Unspecified fall, initial encounter: Secondary | ICD-10-CM | POA: Diagnosis not present

## 2022-10-07 DIAGNOSIS — I6782 Cerebral ischemia: Secondary | ICD-10-CM | POA: Diagnosis not present

## 2022-10-07 DIAGNOSIS — M25552 Pain in left hip: Secondary | ICD-10-CM | POA: Diagnosis not present

## 2022-10-07 DIAGNOSIS — M47812 Spondylosis without myelopathy or radiculopathy, cervical region: Secondary | ICD-10-CM | POA: Diagnosis not present

## 2022-10-07 MED ORDER — ACETAMINOPHEN 500 MG PO TABS
1000.0000 mg | ORAL_TABLET | Freq: Once | ORAL | Status: AC
  Administered 2022-10-07: 1000 mg via ORAL
  Filled 2022-10-07: qty 2

## 2022-10-07 NOTE — ED Provider Notes (Signed)
[facility] Provider Note    Event Date/Time   First MD Initiated Contact with Patient 10/07/22 0009     (approximate)   History   Fall   HPI  Felicia Little is a 77 y.o. female who presents to the ED for evaluation of Fall   Review of neurology clinic visit from 5/22.  History of balance difficulty, HTN, HLD, CVA.  Aspirin and Plavix  Patient presents from her daughter's house via EMS for the ration of an accidental fall.  No syncope or head trauma.  Reports her "balance going out" and striking her left side on the ground.  Reports left hip pain.  No other concerns.  Reports that she "falls a lot."   Physical Exam   Triage Vital Signs: ED Triage Vitals [10/07/22 0008]  Enc Vitals Group     BP      Pulse      Resp      Temp      Temp src      SpO2 98 %     Weight      Height      Head Circumference      Peak Flow      Pain Score      Pain Loc      Pain Edu?      Excl. in GC?     Most recent vital signs: Vitals:   10/07/22 0130 10/07/22 0147  BP: (!) 159/81   Pulse: 74   Resp: (!) 21   Temp:  98.3 F (36.8 C)  SpO2: 93%     General: Awake, no distress.  Pleasant and conversational.  Well-appearing.  No significant pain with transfer from EMS stretcher to our own. CV:  Good peripheral perfusion.  Resp:  Normal effort.  Abd:  No distention.  Soft MSK:  No deformity noted.  Palpation of all 4 without deformity or clear signs of trauma.  No pain to the left hip with logrolling of the LLE Neuro:  No focal deficits appreciated. Other:     ED Results / Procedures / Treatments   Labs (all labs ordered are listed, but only abnormal results are displayed) Labs Reviewed - No data to display  EKG Sinus rhythm with a rate of 74 bpm.  Normal axis and intervals.  No STEMI.  RADIOLOGY Plain film of the pelvis and left hip interpreted by me with evidence of fracture or dislocation. CT head interpreted by me without evidence of acute  intracranial pathology CT cervical spine interpreted by me without evidence of fracture or dislocation  Official radiology report(s): CT HEAD WO CONTRAST ( )  Result Date: 10/07/2022 CLINICAL DATA:  Recent fall on blood thinners, initial encounter EXAM: CT HEAD WITHOUT CONTRAST CT CERVICAL SPINE WITHOUT CONTRAST TECHNIQUE: Multidetector CT imaging of the head and cervical spine was performed following the standard protocol without intravenous contrast. Multiplanar CT image reconstructions of the cervical spine were also generated. RADIATION DOSE REDUCTION: This exam was performed according to the departmental dose-optimization program which includes automated exposure control, adjustment of the mA and/or kV according to patient size and/or use of iterative reconstruction technique. COMPARISON:  None Available. FINDINGS: CT HEAD FINDINGS Brain: No evidence of acute infarction, hemorrhage, hydrocephalus, extra-axial collection or mass lesion/mass effect. Chronic atrophic and ischemic changes are noted. Vascular: No hyperdense vessel or unexpected calcification. Skull: Normal. Negative for fracture or focal lesion. Sinuses/Orbits: No acute finding. Other: None. CT CERVICAL SPINE FINDINGS Alignment: Loss  of the normal cervical lordosis is noted. Skull base and vertebrae: 7 cervical segments are well visualized. Vertebral body height is well maintained. Multilevel osteophytic change and facet hypertrophic changes are seen. No acute fracture or acute facet abnormality is noted. The odontoid is within normal limits. Soft tissues and spinal canal: Surrounding soft tissue structures show no acute abnormality. Vascular calcifications are seen. Upper chest: Lung apices are unremarkable. Other: None IMPRESSION: CT of the head: Chronic atrophic and ischemic changes. CT of the cervical spine: Multilevel degenerative change without acute abnormality. Electronically Signed   By: Alcide Clever M.D.   On: 10/07/2022 00:45    CT Cervical Spine Wo Contrast  Result Date: 10/07/2022 CLINICAL DATA:  Recent fall on blood thinners, initial encounter EXAM: CT HEAD WITHOUT CONTRAST CT CERVICAL SPINE WITHOUT CONTRAST TECHNIQUE: Multidetector CT imaging of the head and cervical spine was performed following the standard protocol without intravenous contrast. Multiplanar CT image reconstructions of the cervical spine were also generated. RADIATION DOSE REDUCTION: This exam was performed according to the departmental dose-optimization program which includes automated exposure control, adjustment of the mA and/or kV according to patient size and/or use of iterative reconstruction technique. COMPARISON:  None Available. FINDINGS: CT HEAD FINDINGS Brain: No evidence of acute infarction, hemorrhage, hydrocephalus, extra-axial collection or mass lesion/mass effect. Chronic atrophic and ischemic changes are noted. Vascular: No hyperdense vessel or unexpected calcification. Skull: Normal. Negative for fracture or focal lesion. Sinuses/Orbits: No acute finding. Other: None. CT CERVICAL SPINE FINDINGS Alignment: Loss of the normal cervical lordosis is noted. Skull base and vertebrae: 7 cervical segments are well visualized. Vertebral body height is well maintained. Multilevel osteophytic change and facet hypertrophic changes are seen. No acute fracture or acute facet abnormality is noted. The odontoid is within normal limits. Soft tissues and spinal canal: Surrounding soft tissue structures show no acute abnormality. Vascular calcifications are seen. Upper chest: Lung apices are unremarkable. Other: None IMPRESSION: CT of the head: Chronic atrophic and ischemic changes. CT of the cervical spine: Multilevel degenerative change without acute abnormality. Electronically Signed   By: Alcide Clever M.D.   On: 10/07/2022 00:45   DG HIP UNILAT WITH PELVIS 2-3 VIEWS LEFT  Result Date: 10/07/2022 CLINICAL DATA:  Recent fall with left hip pain, initial  encounter EXAM: DG HIP (WITH OR WITHOUT PELVIS) 3V LEFT COMPARISON:  05/02/2019 FINDINGS: Bilateral hip replacements are noted. Pelvic ring is intact. No acute fracture or dislocation is noted. No soft tissue changes are seen. IMPRESSION: No acute abnormality noted. Electronically Signed   By: Alcide Clever M.D.   On: 10/07/2022 00:43    PROCEDURES and INTERVENTIONS:  Procedures  Medications  acetaminophen (TYLENOL) tablet 1,000 mg (1,000 mg Oral Given 10/07/22 0020)     IMPRESSION / MDM / ASSESSMENT AND PLAN / ED COURSE  I reviewed the triage vital signs and the nursing notes.  Differential diagnosis includes, but is not limited to, dislocation, periprosthetic fracture, stroke, ICH  {Patient presents with symptoms of an acute illness or injury that is potentially life-threatening.  Patient presents after recurrence of a mechanical fall without evidence of significant injury and suitable for outpatient management.  Reassuring imaging, workup and EKG.  Suitable for outpatient management, discharged with daughter      FINAL CLINICAL IMPRESSION(S) / ED DIAGNOSES   Final diagnoses:  Fall, initial encounter     Rx / DC Orders   ED Discharge Orders     None        Note:  This document was prepared using Dragon voice recognition software and may include unintentional dictation errors.   Delton [place], MD 10/07/22 [phone]

## 2022-10-07 NOTE — Discharge Instructions (Signed)
Use Tylenol for pain and fevers.  Up to 1000 mg per dose, up to 4 times per day.  Do not take more than 4000 mg of Tylenol/acetaminophen within 24 hours..  

## 2022-10-07 NOTE — ED Triage Notes (Signed)
BIB EMS/ fall d/t loss of balance/ denies hitting head/ denies  LOC/ takes Eliquis/ left sided weakness from past stroke/ A&Ox4/ c/o left hip with no obvious deformity

## 2022-10-20 ENCOUNTER — Ambulatory Visit: Payer: Medicare Other | Admitting: Physician Assistant

## 2022-10-25 ENCOUNTER — Encounter: Payer: Self-pay | Admitting: Physician Assistant

## 2022-11-07 ENCOUNTER — Other Ambulatory Visit: Payer: Self-pay

## 2022-11-07 ENCOUNTER — Emergency Department: Payer: Medicare HMO

## 2022-11-07 ENCOUNTER — Emergency Department
Admission: EM | Admit: 2022-11-07 | Discharge: 2022-11-07 | Discharge status: Against Medical Advice | Payer: Medicare HMO | Attending: Emergency Medicine | Admitting: Emergency Medicine

## 2022-11-07 DIAGNOSIS — R29818 Other symptoms and signs involving the nervous system: Secondary | ICD-10-CM | POA: Diagnosis not present

## 2022-11-07 DIAGNOSIS — I700 Atherosclerosis of aorta: Secondary | ICD-10-CM | POA: Diagnosis not present

## 2022-11-07 DIAGNOSIS — R4701 Aphasia: Secondary | ICD-10-CM | POA: Diagnosis not present

## 2022-11-07 DIAGNOSIS — Z5321 Procedure and treatment not carried out due to patient leaving prior to being seen by health care provider: Secondary | ICD-10-CM | POA: Insufficient documentation

## 2022-11-07 DIAGNOSIS — R531 Weakness: Secondary | ICD-10-CM | POA: Diagnosis not present

## 2022-11-07 DIAGNOSIS — Z8673 Personal history of transient ischemic attack (TIA), and cerebral infarction without residual deficits: Secondary | ICD-10-CM | POA: Insufficient documentation

## 2022-11-07 DIAGNOSIS — I771 Stricture of artery: Secondary | ICD-10-CM | POA: Diagnosis not present

## 2022-11-07 LAB — CBC
HCT: 36.8 % (ref 36.0–46.0)
Hemoglobin: 11.7 g/dL — ABNORMAL LOW (ref 12.0–15.0)
MCH: 29 pg (ref 26.0–34.0)
MCHC: 31.8 g/dL (ref 30.0–36.0)
MCV: 91.1 fL (ref 80.0–100.0)
Platelets: 289 10*3/uL (ref 150–400)
RBC: 4.04 MIL/uL (ref 3.87–5.11)
RDW: 12.9 % (ref 11.5–15.5)
WBC: 8.9 10*3/uL (ref 4.0–10.5)
nRBC: 0 % (ref 0.0–0.2)

## 2022-11-07 LAB — COMPREHENSIVE METABOLIC PANEL
ALT: 8 U/L (ref 0–44)
AST: 15 U/L (ref 15–41)
Albumin: 3.6 g/dL (ref 3.5–5.0)
Alkaline Phosphatase: 59 U/L (ref 38–126)
Anion gap: 8 (ref 5–15)
BUN: 27 mg/dL — ABNORMAL HIGH (ref 8–23)
CO2: 24 mmol/L (ref 22–32)
Calcium: 9.6 mg/dL (ref 8.9–10.3)
Chloride: 106 mmol/L (ref 98–111)
Creatinine, Ser: 1.93 mg/dL — ABNORMAL HIGH (ref 0.44–1.00)
GFR, Estimated: 26 mL/min — ABNORMAL LOW (ref >60)
Glucose, Bld: 97 mg/dL (ref 70–99)
Potassium: 3.1 mmol/L — ABNORMAL LOW (ref 3.5–5.1)
Sodium: 138 mmol/L (ref 135–145)
Total Bilirubin: 0.5 mg/dL (ref 0.3–1.2)
Total Protein: 7 g/dL (ref 6.5–8.1)

## 2022-11-07 LAB — TROPONIN I (HIGH SENSITIVITY): Troponin I (High Sensitivity): 9 ng/L (ref <18)

## 2022-11-07 MED ORDER — IOHEXOL 350 MG/ML SOLN: 75.0000 mL | Freq: Once | INTRAVENOUS | Status: DC | PRN

## 2022-11-07 NOTE — ED Notes (Signed)
Family reports they are taking pt home, encouraged them to stay to be seen, family states they will follow up with PCP, encouraged to return to ED if sxs worsen, family verbalized understanding.

## 2022-11-07 NOTE — ED Triage Notes (Signed)
Pt presents to ER with daughter with c/o increased left side weakness over last 2 weeks.  Pt states she had been in OT appx 2 weeks ago, but her sessions ended, and pt states her weakness has been worsening since ending tx.  Pt has hx of TIA, and is noted to have some aphasia, which daughter states is normal.  Pt is otherwise A&O x4 and in NAD at this time.

## 2022-11-09 DIAGNOSIS — N179 Acute kidney failure, unspecified: Secondary | ICD-10-CM | POA: Diagnosis not present

## 2022-11-09 DIAGNOSIS — E785 Hyperlipidemia, unspecified: Secondary | ICD-10-CM | POA: Diagnosis not present

## 2022-11-09 DIAGNOSIS — R531 Weakness: Secondary | ICD-10-CM | POA: Diagnosis not present

## 2022-11-09 DIAGNOSIS — R7989 Other specified abnormal findings of blood chemistry: Secondary | ICD-10-CM | POA: Diagnosis not present

## 2022-11-09 DIAGNOSIS — M47812 Spondylosis without myelopathy or radiculopathy, cervical region: Secondary | ICD-10-CM | POA: Diagnosis not present

## 2022-11-09 DIAGNOSIS — M25552 Pain in left hip: Secondary | ICD-10-CM | POA: Diagnosis not present

## 2022-11-09 DIAGNOSIS — N183 Chronic kidney disease, stage 3 unspecified: Secondary | ICD-10-CM | POA: Diagnosis not present

## 2022-11-09 DIAGNOSIS — N3281 Overactive bladder: Secondary | ICD-10-CM | POA: Diagnosis not present

## 2022-11-09 DIAGNOSIS — I6523 Occlusion and stenosis of bilateral carotid arteries: Secondary | ICD-10-CM | POA: Diagnosis not present

## 2022-11-09 DIAGNOSIS — R296 Repeated falls: Secondary | ICD-10-CM | POA: Diagnosis not present

## 2022-11-09 DIAGNOSIS — I129 Hypertensive chronic kidney disease with stage 1 through stage 4 chronic kidney disease, or unspecified chronic kidney disease: Secondary | ICD-10-CM | POA: Diagnosis not present

## 2022-11-09 DIAGNOSIS — R1310 Dysphagia, unspecified: Secondary | ICD-10-CM | POA: Diagnosis not present

## 2022-11-09 DIAGNOSIS — R4182 Altered mental status, unspecified: Secondary | ICD-10-CM | POA: Diagnosis not present

## 2022-11-09 DIAGNOSIS — R269 Unspecified abnormalities of gait and mobility: Secondary | ICD-10-CM | POA: Diagnosis not present

## 2022-11-09 DIAGNOSIS — R29898 Other symptoms and signs involving the musculoskeletal system: Secondary | ICD-10-CM | POA: Diagnosis not present

## 2022-11-09 DIAGNOSIS — F01B0 Vascular dementia, moderate, without behavioral disturbance, psychotic disturbance, mood disturbance, and anxiety: Secondary | ICD-10-CM | POA: Diagnosis not present

## 2022-11-09 DIAGNOSIS — T8484XA Pain due to internal orthopedic prosthetic devices, implants and grafts, initial encounter: Secondary | ICD-10-CM | POA: Diagnosis not present

## 2022-11-09 DIAGNOSIS — J9811 Atelectasis: Secondary | ICD-10-CM | POA: Diagnosis not present

## 2022-11-10 DIAGNOSIS — R7989 Other specified abnormal findings of blood chemistry: Secondary | ICD-10-CM | POA: Diagnosis not present

## 2022-11-10 DIAGNOSIS — R350 Frequency of micturition: Secondary | ICD-10-CM | POA: Diagnosis not present

## 2022-11-10 DIAGNOSIS — R296 Repeated falls: Secondary | ICD-10-CM | POA: Diagnosis not present

## 2022-11-10 DIAGNOSIS — R531 Weakness: Secondary | ICD-10-CM | POA: Diagnosis not present

## 2022-11-10 DIAGNOSIS — G3184 Mild cognitive impairment, so stated: Secondary | ICD-10-CM | POA: Diagnosis not present

## 2022-11-11 DIAGNOSIS — R531 Weakness: Secondary | ICD-10-CM | POA: Diagnosis not present

## 2022-11-11 DIAGNOSIS — R296 Repeated falls: Secondary | ICD-10-CM | POA: Diagnosis not present

## 2022-11-11 DIAGNOSIS — R9431 Abnormal electrocardiogram [ECG] [EKG]: Secondary | ICD-10-CM | POA: Diagnosis not present

## 2022-11-11 DIAGNOSIS — R350 Frequency of micturition: Secondary | ICD-10-CM | POA: Diagnosis not present

## 2022-11-11 DIAGNOSIS — G3184 Mild cognitive impairment, so stated: Secondary | ICD-10-CM | POA: Diagnosis not present

## 2022-11-12 DIAGNOSIS — R296 Repeated falls: Secondary | ICD-10-CM | POA: Diagnosis not present

## 2022-11-12 DIAGNOSIS — N183 Chronic kidney disease, stage 3 unspecified: Secondary | ICD-10-CM | POA: Diagnosis not present

## 2022-11-12 DIAGNOSIS — R350 Frequency of micturition: Secondary | ICD-10-CM | POA: Diagnosis not present

## 2022-11-12 DIAGNOSIS — N179 Acute kidney failure, unspecified: Secondary | ICD-10-CM | POA: Diagnosis not present

## 2022-11-12 DIAGNOSIS — F0390 Unspecified dementia without behavioral disturbance: Secondary | ICD-10-CM | POA: Diagnosis not present

## 2022-11-12 NOTE — Discharge Summary (Signed)
 Physician Discharge Summary [facility] [address] [place] [address] Dept: [phone] Loc: [phone]   Identifying Information:  Felicia Little 1945-11-17 [id number]  Primary Care Physician: Gretta Comer Junker, AGNP  Code Status: DNR and DNI  Admit Date: 11/09/2022  Discharge Date: 11/12/2022   Discharge To: Home with Home Health and/or PT/OT  Discharge Service: HBR - HBB: Hospitalist Service #1   Discharge Attending Physician: Prentice SHAUNNA Search, MD   Discharge Diagnoses: Principal Problem:   Elevated serum creatinine (POA: Yes) Active Problems:   Falls, initial encounter (POA: Yes)   History of ischemic stroke (POA: Not Applicable)   Moderate vascular dementia without behavioral disturbance, psychotic disturbance, mood disturbance, or anxiety (CMS-HCC) (POA: Yes)   Anxiety (POA: Unknown)   Chronic pain (POA: Unknown)   Hyperlipidemia (POA: Unknown)   Hypertension (POA: Unknown)   Osteoarthritis (POA: Unknown)   Renal insufficiency (POA: Unknown) Resolved Problems:   * No resolved hospital problems. *   Outpatient follow-up items: [ ]  Discuss community resources for elder care (phone numbers provided at discharge) or any services that could reduce caregiver burden [ ]  Encourage smoking cessation (nicotine  replacement re-prescribed at discharge) [ ]  Consider up-titrating duloxetine  to target mood, neuropathic pain  Hospital course: Felicia Little is a 77 y.o. woman with osteoarthritis s/p bilateral THA and right TKA, HTN, prior right-sided ischemic stroke with residual left arm and leg weakness, and probable vascular dementia who was brought to ED after multiple falls in her home.  # Frequent falls As detailed in H&P, has fallen numerous times while trying to navigate her apartment with cane and walker. Family attributed this in part to weakness in left leg, leading to knee giving out especially in transfers from bed to floor. CT head and  cervical spine in ED showed no fractures. Plain films of left hip showed no fracture and arthroplasty hardware in expected position with good joint alignment. Contributing factors to frequent falls likely include left hip pain from osteoarthritis, deconditioning, deficits from prior stroke, cognitive decline, polypharmacy. We stopped (donepezil , trospium ) or decreased doses (pregabalin , nortriptyline ) of medications whose anticholinergic properties may put her at increased risk of falls. She worked with PT and OT who offered conditional recommendation for services 5x weekly, but after long discussion of benefits and risks with family, and acknowledging uncertainty regarding insurance coverage for SNF, they ultimately opted to discharge home with 24/7 supervision from family members and [facility] PT and OT services.  # Dementia, likely due to chronic microvascular disease Family members describe cognitive decline over the past several years. Fluctuations in memory, mood and attention in last 4-8 weeks have been attributed to respiratory or urinary tract infections, but general trend has been one of increasing dependence on family members for help with housework and ADLs. Her adult daughters have taken over her finances and she is no longer allowed to drive. Within the last several months they have more difficulty with bowel and bladder control, as well as occasional difficulty swallowing. In the hospital, she was oriented to person and situation, sometimes to exact location, but not to exact time. She needed close supervision and frequent re-orientation, although did not require physical or chemical restraints. We had a long discussion with her adult daughters regarding the inexorably progressive nature of dementia and the likelihood that Felicia Little will require increasing support to remain safe in her home. Contact information for local senior services in [facility] provided in discharge instructions.  We discussed  a  number of ways the family can help make Felicia Little's home environment safer including removing loose rugs, ensuring adequate lighting, installing grab bars by toilet. Encouraged use of pill organizer, prepared by someone other than the patient, to avoid medication errors.  # Urinary frequency Working diagnosis from outpatient physicians was overactive bladder. She was treated for cystitis about 3 weeks before admission without change in symptoms. There was question of residual UTI when she presented to ED; she had mild pyuria and urine culture grew <50K CFU E faecalis but she denied any dysuria and we decided against treatment in absence of new symptoms. We discussed that incontinence is often a feature of cognitive decline.  # AKI on CKD3 Recent baseline sCr not clear, but was 1.1 - 1.4 in mid-2023. Elevated to 1.63 on admission, improved to 1.06 after gentle IV fluids. GFR ~ 40 by cystatin-C.  # Essential hypertension Continued amlodipine , carvedilol .  # Hx ischemic stroke See above regarding role of residual motor deficits in falls. Continued aspirin  and statin but stopped clopidogrel  as last documented stroke was in 2021 and she no longer has indication for dual antiplatelet therapy.  # Chronic back and hip pain Continued duloxetine  but decreased doses of pregabalin  and nortriptyline , with plan to wean off the latter altogether due to risk of adverse effects in th elderly. Encouraged use of Tylenol , topical treatments and PT / OT.  # Tobacco use She continues to smoke and had nicotine  cravings in the hospital. Offered nicotine  patch and gum both inpatient and at discharge. In addition to cardiovascular benefits from smoking cessation, she would benefit from stopping to reduce risk of fire or accidental burn injury.   Procedures: None ______________________________________________________________________ Discharge Medications:   Your Medication List     STOP taking these medications     clopidogrel  75 mg tablet Commonly known as: PLAVIX        START taking these medications    nicotine  polacrilex 4 MG gum Commonly known as: NICORETTE Chew 1 piece (4 mg) every hour as needed for cravings for 12 weeks. Max 20 pieces per day. Chew slowly until it tingles, then place gum between cheek and gums until tingle is gone; repeat process until most of tingle is gone. Do not eat or drink 15 minutes before using or while the gum is in mouth.       CHANGE how you take these medications    acetaminophen  500 MG tablet Commonly known as: TYLENOL  Take 2 tablets (1,000 mg total) by mouth every eight (8) hours. What changed:  how much to take when to take this reasons to take this   amlodipine  5 MG tablet Commonly known as: NORVASC  Take 1 tablet (5 mg total) by mouth daily. What changed:  medication strength how much to take   nicotine  14 mg/24 hr patch Commonly known as: NICODERM CQ  Place 1 patch on the skin daily. What changed: when to take this   nortriptyline  10 MG capsule Commonly known as: PAMELOR  Take 2 capsules (20 mg total) by mouth nightly for 7 days, THEN 1 capsule (10 mg total) nightly for 7 days. then STOP. Start taking on: November 12, 2022 What changed:  medication strength See the new instructions.   pregabalin  25 MG capsule Commonly known as: LYRICA  Take 3 capsules (75 mg total) by mouth two (2) times a day. What changed: medication strength       CONTINUE taking these medications    aspirin  81 MG chewable tablet Chew 1 tablet (81  mg total) daily.   atorvastatin  80 MG tablet Commonly known as: LIPITOR Take 1 tablet (80 mg total) by mouth daily.   carvedilol  25 MG tablet Commonly known as: COREG  Take 1 tablet (25 mg total) by mouth two (2) times a day.   cholecalciferol (vitamin D3-50 mcg (2,000 unit)) 50 mcg (2,000 unit) Cap Take 1 capsule (50 mcg total) by mouth daily.   DULoxetine  20 MG capsule Commonly known as: CYMBALTA  Take 1  capsule (20 mg total) by mouth daily.        Allergies: Patient has no known allergies. ______________________________________________________________________ Pending Test Results (if blank, then none):   Most Recent Labs: Recent Labs    Units 11/09/22 1133 11/11/22 0546  WBC 10*9/L 8.6 7.3  RBC 10*12/L 3.43* 3.32*  HGB g/dL 89.7* 9.9*  HCT % 69.2* 29.7*  MCV fL 89.2 89.3  MCH pg 29.6 29.7  MCHC g/dL 66.7 66.6  RDW % 86.1 86.1  PLT 10*9/L 226 213  MPV fL 8.4 8.8   Recent Labs    Units 11/09/22 1133 11/10/22 0545 11/11/22 0546  NA mmol/L 143 146* 145  K mmol/L 3.5 3.5 3.9  CL mmol/L 114* 117* 117*  CO2 mmol/L 22.5 23.4 23.1  BUN mg/dL 21 18 18   CREATININE mg/dL 8.36* 8.66* 8.93*  GLU mg/dL 82 98 88  CALCIUM  mg/dL 9.6 9.3 9.5  ALBUMIN g/dL 3.1*  --   --   PROT g/dL 6.2  --   --   BILITOT mg/dL 0.4  --   --   ALKPHOS U/L 63  --   --   ALT U/L <7*  --   --   AST U/L 16  --   --    Susceptibility data from last 90 days. Collected Specimen Info Organism  11/09/22 Urine from Clean Catch Enterococcus faecalis    Mixed Urogenital Flora    Relevant Studies/Radiology (if blank, then none): ECG 12 Lead  Result Date: 11/11/2022 NORMAL SINUS RHYTHM ST & T WAVE ABNORMALITY, CONSIDER INFERIOR ISCHEMIA ABNORMAL ECG WHEN COMPARED WITH ECG OF 09-Nov-2022 11:10, NO SIGNIFICANT CHANGE WAS FOUND Confirmed by Antonetta Gull ([id number]) on 11/11/2022 7:18:03 AM  ECG 12 Lead  Result Date: 11/11/2022 NORMAL SINUS RHYTHM ST & T WAVE ABNORMALITY, CONSIDER INFERIOR ISCHEMIA ST & T WAVE ABNORMALITY, CONSIDER ANTEROLATERAL ISCHEMIA ABNORMAL ECG WHEN COMPARED WITH ECG OF 23-Jul-2019 07:58, NO SIGNIFICANT CHANGE WAS FOUND Confirmed by Antonetta Gull ([id number]) on 11/11/2022 7:08:18 AM  XR Hip 2 Views Left  Result Date: 11/09/2022 EXAM: XR HIP 2 VIEWS LEFT DATE: 11/09/2022 3:35 PM ACCESSION: [id number] UN DICTATED: 11/09/2022 3:36 PM INTERPRETATION LOCATION: [facility] CLINICAL INDICATION: 77 years old  Female with fall, left hip tenderness   COMPARISON: 11/24/2018. TECHNIQUE:  AP and lateral views of the left hip. FINDINGS: Intact left total hip arthroplasty. No periprosthetic lucency or fracture. Normal alignment. The included left hemipelvis is intact. Incidental vascular calcifications. Lower lumbar fusion hardware is incompletely evaluated.   Intact left total hip arthroplasty.  CT Cervical Spine Wo Contrast  Result Date: 11/09/2022 EXAM: CT CERVICAL SPINE WITHOUT CONTRAST DATE: 11/09/2022 3:25 PM ACCESSION: [id number] UN DICTATED: 11/09/2022 3:25 PM INTERPRETATION LOCATION: [facility] CLINICAL INDICATION: 77 years old Female with falls, ams  COMPARISON: CT cervical spine dated/30/2024.. TECHNIQUE: An axially-acquired helical CT scan of the cervical spine was obtained without contrast.  Transverse, coronal and sagittal images were reconstructed at 2-mm increments. FINDINGS: Evaluation of the lower spine is moderately limited by high image noise. Generalized reduced bone density.  Straightening of normal cervical lordosis. Vertebral body heights are normal. No significant spondylolisthesis. The atlantooccipital and atlantoaxial articulations are unremarkable. Multilevel facet arthrosis and facet ankylosis at left C3-C4 level. Multilevel uncovertebral arthrosis, most pronounced at C4-C5 and C5-C6. Multilevel reduced intervertebral disc spaces with associated endplate irregularity, sclerosis, and subcortical cystic changes. No definite acute fracture in cervical spine. The osseous spinal canal is patent. Multilevel osseous neural foraminal stenoses. No abnormal widening or narrowing of the intraspinous spaces. Atherosclerotic changes in the carotid vessels bilaterally. Bilateral tonsilloliths. Mucosal thickening in left sphenoid sinus. Prominent enlargement of the right internal jugular and right subclavian veins. Airway is midline and patent. Subsegmental atelectatic changes in the left upper lobe. Visualized  lung apices are otherwise unremarkable.   Redemonstrated advanced cervical spondylotic changes. No acute fracture or traumatic listhesis.   CT Head Wo Contrast  Result Date: 11/09/2022 EXAM: Computed tomography, head or brain without contrast material. ACCESSION: [id number] UN CLINICAL INDICATION: 77 years old Female with falls  COMPARISON: Same day CT cervical spine, 08/02/2022 CT head. TECHNIQUE: Axial CT images of the head  from skull base to vertex without contrast. FINDINGS: Mild cerebral volume loss with ex vacuo dilatation of the CSF-containing spaces. There are scattered and confluent hypodense foci within the periventricular and deep white matter.  These are nonspecific but commonly associated with small vessel ischemic changes. There is no midline shift. No mass lesion. There is no evidence of acute infarct. No acute intracranial hemorrhage. No fractures are evident. Partial opacification of the left sphenoid sinus and a few ethmoid air cells bilaterally. There are atherosclerotic calcifications of the carotid siphons and bilateral intradural vertebral arteries.   No acute intracranial abnormality.   XR Chest Portable  Result Date: 11/09/2022 EXAM: XR CHEST PORTABLE ACCESSION: [id number] UN CLINICAL INDICATION: Altered mental status. TECHNIQUE: Single View AP Chest Radiograph. COMPARISON: July 30. FINDINGS: Clear lungs with no pleural fluid or pneumothorax. Normal cardiomediastinal contour with calcified aorta.   No acute abnormalities.  ______________________________________________________________________ Discharge Instructions:     Follow Up instructions and Outpatient Referrals    Ambulatory Referral to Home Health     Reason for referral: home PT/OT   Physician to follow patient's care: PCP   Disciplines requested:  Physical Therapy Occupational Therapy     Physical Therapy requested: Evaluate and treat   Occupational Therapy Requested: Evaluate and treat   Requested SOC  Date:  Comment - within 48 hours of dc   Call MD for:  difficulty breathing, headache or visual disturbances     Call MD for:  persistent dizziness or light-headedness     Call MD for:  persistent nausea or vomiting     Call MD for:  severe uncontrolled pain     Call MD for: Temperature > 38.5 Celsius ( > 101.3 Fahrenheit)     Discharge instructions       Other Instructions     Call MD for:  difficulty breathing, headache or visual disturbances     Call MD for:  persistent dizziness or light-headedness     Call MD for:  persistent nausea or vomiting     Call MD for:  severe uncontrolled pain     Call MD for: Temperature > 38.5 Celsius ( > 101.3 Fahrenheit)     Discharge instructions     You were admitted to [facility] after multiple falls. Xrays of your hips did not show fractures. We made adjustments to medications that may decrease risk of falling again. You would benefit  from a course of physical therapy, and we made referrals for in-home services.  KEY MEDICATION CHANGES * DECREASE pregabalin  (Lyrica ) from 75 mg twice daily to 25 mg twice daily (new prescription) * TAPER nortriptyline  (Pamelor ): take 20 mg = 2 capsules nightly for 1 week, then 10 mg = 1 tapsule nightly for 1 week, then STOP * STOP trospium  * STOP clopidogrel  (Plavix ); aspirin  is sufficient  RESOURCES FOR SENIOR CARE *  [facility] [phone] [facility] Dept of Social Services, adult services division [phone] * [facility], [place] [address], [place] [address], [phone]   Discharge instructions to patient: Call your primary care doctor and make an appointment to see them:     Within 2 weeks from the time you are discharged from the hospital        ______________________________________________________________________ Discharge Day Services: BP 197/90   Pulse 57   Temp 36.7 C (98.1 F) (Oral)   Resp 18   Ht 165.1 cm (5' 5)   Wt 74.5  kg (164 lb 3.9 oz)   SpO2 100%   BMI 27.33 kg/m   Pt seen on the day of discharge and determined appropriate for discharge.  Condition at Discharge: stable  Length of Discharge: I spent greater than 30 mins in the discharge of this patient.  ------------ Prentice SHAUNNA Search, MD PhD [facility]

## 2022-11-21 DIAGNOSIS — R29818 Other symptoms and signs involving the nervous system: Secondary | ICD-10-CM | POA: Diagnosis not present

## 2022-11-21 DIAGNOSIS — R531 Weakness: Secondary | ICD-10-CM | POA: Diagnosis not present

## 2022-11-21 DIAGNOSIS — M1712 Unilateral primary osteoarthritis, left knee: Secondary | ICD-10-CM | POA: Diagnosis not present

## 2022-11-21 DIAGNOSIS — Z9181 History of falling: Secondary | ICD-10-CM | POA: Diagnosis not present

## 2022-11-21 DIAGNOSIS — M25552 Pain in left hip: Secondary | ICD-10-CM | POA: Diagnosis not present

## 2022-11-21 DIAGNOSIS — R29898 Other symptoms and signs involving the musculoskeletal system: Secondary | ICD-10-CM | POA: Diagnosis not present

## 2022-11-21 DIAGNOSIS — F03A0 Unspecified dementia, mild, without behavioral disturbance, psychotic disturbance, mood disturbance, and anxiety: Secondary | ICD-10-CM | POA: Diagnosis not present

## 2022-11-21 DIAGNOSIS — Z8673 Personal history of transient ischemic attack (TIA), and cerebral infarction without residual deficits: Secondary | ICD-10-CM | POA: Diagnosis not present

## 2022-11-21 DIAGNOSIS — M25511 Pain in right shoulder: Secondary | ICD-10-CM | POA: Diagnosis not present

## 2022-11-21 DIAGNOSIS — R413 Other amnesia: Secondary | ICD-10-CM | POA: Diagnosis not present

## 2022-11-21 DIAGNOSIS — M25462 Effusion, left knee: Secondary | ICD-10-CM | POA: Diagnosis not present

## 2022-11-22 DIAGNOSIS — R300 Dysuria: Secondary | ICD-10-CM | POA: Diagnosis not present

## 2022-11-22 DIAGNOSIS — Z87440 Personal history of urinary (tract) infections: Secondary | ICD-10-CM | POA: Diagnosis not present

## 2022-11-23 DIAGNOSIS — M25552 Pain in left hip: Secondary | ICD-10-CM | POA: Diagnosis not present

## 2022-11-23 DIAGNOSIS — M1712 Unilateral primary osteoarthritis, left knee: Secondary | ICD-10-CM | POA: Diagnosis not present

## 2022-11-23 DIAGNOSIS — I129 Hypertensive chronic kidney disease with stage 1 through stage 4 chronic kidney disease, or unspecified chronic kidney disease: Secondary | ICD-10-CM | POA: Diagnosis not present

## 2022-11-23 DIAGNOSIS — M549 Dorsalgia, unspecified: Secondary | ICD-10-CM | POA: Diagnosis not present

## 2022-11-23 DIAGNOSIS — G8929 Other chronic pain: Secondary | ICD-10-CM | POA: Diagnosis not present

## 2022-11-23 DIAGNOSIS — F01B4 Vascular dementia, moderate, with anxiety: Secondary | ICD-10-CM | POA: Diagnosis not present

## 2022-11-23 DIAGNOSIS — N183 Chronic kidney disease, stage 3 unspecified: Secondary | ICD-10-CM | POA: Diagnosis not present

## 2022-11-23 DIAGNOSIS — I69354 Hemiplegia and hemiparesis following cerebral infarction affecting left non-dominant side: Secondary | ICD-10-CM | POA: Diagnosis not present

## 2022-11-23 DIAGNOSIS — N179 Acute kidney failure, unspecified: Secondary | ICD-10-CM | POA: Diagnosis not present

## 2022-11-25 DIAGNOSIS — N183 Chronic kidney disease, stage 3 unspecified: Secondary | ICD-10-CM | POA: Diagnosis not present

## 2022-11-25 DIAGNOSIS — N179 Acute kidney failure, unspecified: Secondary | ICD-10-CM | POA: Diagnosis not present

## 2022-11-25 DIAGNOSIS — M1712 Unilateral primary osteoarthritis, left knee: Secondary | ICD-10-CM | POA: Diagnosis not present

## 2022-11-25 DIAGNOSIS — M549 Dorsalgia, unspecified: Secondary | ICD-10-CM | POA: Diagnosis not present

## 2022-11-25 DIAGNOSIS — I69354 Hemiplegia and hemiparesis following cerebral infarction affecting left non-dominant side: Secondary | ICD-10-CM | POA: Diagnosis not present

## 2022-11-25 DIAGNOSIS — I129 Hypertensive chronic kidney disease with stage 1 through stage 4 chronic kidney disease, or unspecified chronic kidney disease: Secondary | ICD-10-CM | POA: Diagnosis not present

## 2022-11-25 DIAGNOSIS — M25552 Pain in left hip: Secondary | ICD-10-CM | POA: Diagnosis not present

## 2022-11-25 DIAGNOSIS — G8929 Other chronic pain: Secondary | ICD-10-CM | POA: Diagnosis not present

## 2022-11-25 DIAGNOSIS — F01B4 Vascular dementia, moderate, with anxiety: Secondary | ICD-10-CM | POA: Diagnosis not present

## 2022-11-29 DIAGNOSIS — F01B4 Vascular dementia, moderate, with anxiety: Secondary | ICD-10-CM | POA: Diagnosis not present

## 2022-11-29 DIAGNOSIS — M549 Dorsalgia, unspecified: Secondary | ICD-10-CM | POA: Diagnosis not present

## 2022-11-29 DIAGNOSIS — N179 Acute kidney failure, unspecified: Secondary | ICD-10-CM | POA: Diagnosis not present

## 2022-11-29 DIAGNOSIS — I129 Hypertensive chronic kidney disease with stage 1 through stage 4 chronic kidney disease, or unspecified chronic kidney disease: Secondary | ICD-10-CM | POA: Diagnosis not present

## 2022-11-29 DIAGNOSIS — I69354 Hemiplegia and hemiparesis following cerebral infarction affecting left non-dominant side: Secondary | ICD-10-CM | POA: Diagnosis not present

## 2022-11-29 DIAGNOSIS — N183 Chronic kidney disease, stage 3 unspecified: Secondary | ICD-10-CM | POA: Diagnosis not present

## 2022-11-29 DIAGNOSIS — M1712 Unilateral primary osteoarthritis, left knee: Secondary | ICD-10-CM | POA: Diagnosis not present

## 2022-11-29 DIAGNOSIS — G8929 Other chronic pain: Secondary | ICD-10-CM | POA: Diagnosis not present

## 2022-11-29 DIAGNOSIS — M25552 Pain in left hip: Secondary | ICD-10-CM | POA: Diagnosis not present

## 2022-12-01 DIAGNOSIS — F01B4 Vascular dementia, moderate, with anxiety: Secondary | ICD-10-CM | POA: Diagnosis not present

## 2022-12-01 DIAGNOSIS — G8929 Other chronic pain: Secondary | ICD-10-CM | POA: Diagnosis not present

## 2022-12-01 DIAGNOSIS — N183 Chronic kidney disease, stage 3 unspecified: Secondary | ICD-10-CM | POA: Diagnosis not present

## 2022-12-01 DIAGNOSIS — M549 Dorsalgia, unspecified: Secondary | ICD-10-CM | POA: Diagnosis not present

## 2022-12-01 DIAGNOSIS — I69354 Hemiplegia and hemiparesis following cerebral infarction affecting left non-dominant side: Secondary | ICD-10-CM | POA: Diagnosis not present

## 2022-12-01 DIAGNOSIS — I129 Hypertensive chronic kidney disease with stage 1 through stage 4 chronic kidney disease, or unspecified chronic kidney disease: Secondary | ICD-10-CM | POA: Diagnosis not present

## 2022-12-01 DIAGNOSIS — M1712 Unilateral primary osteoarthritis, left knee: Secondary | ICD-10-CM | POA: Diagnosis not present

## 2022-12-01 DIAGNOSIS — N179 Acute kidney failure, unspecified: Secondary | ICD-10-CM | POA: Diagnosis not present

## 2022-12-01 DIAGNOSIS — M25552 Pain in left hip: Secondary | ICD-10-CM | POA: Diagnosis not present

## 2022-12-06 DIAGNOSIS — M1712 Unilateral primary osteoarthritis, left knee: Secondary | ICD-10-CM | POA: Diagnosis not present

## 2022-12-06 DIAGNOSIS — F01B4 Vascular dementia, moderate, with anxiety: Secondary | ICD-10-CM | POA: Diagnosis not present

## 2022-12-06 DIAGNOSIS — M549 Dorsalgia, unspecified: Secondary | ICD-10-CM | POA: Diagnosis not present

## 2022-12-06 DIAGNOSIS — N179 Acute kidney failure, unspecified: Secondary | ICD-10-CM | POA: Diagnosis not present

## 2022-12-06 DIAGNOSIS — M25552 Pain in left hip: Secondary | ICD-10-CM | POA: Diagnosis not present

## 2022-12-06 DIAGNOSIS — I69354 Hemiplegia and hemiparesis following cerebral infarction affecting left non-dominant side: Secondary | ICD-10-CM | POA: Diagnosis not present

## 2022-12-06 DIAGNOSIS — G8929 Other chronic pain: Secondary | ICD-10-CM | POA: Diagnosis not present

## 2022-12-08 DIAGNOSIS — G8929 Other chronic pain: Secondary | ICD-10-CM | POA: Diagnosis not present

## 2022-12-08 DIAGNOSIS — M1712 Unilateral primary osteoarthritis, left knee: Secondary | ICD-10-CM | POA: Diagnosis not present

## 2022-12-08 DIAGNOSIS — F01B4 Vascular dementia, moderate, with anxiety: Secondary | ICD-10-CM | POA: Diagnosis not present

## 2022-12-08 DIAGNOSIS — M549 Dorsalgia, unspecified: Secondary | ICD-10-CM | POA: Diagnosis not present

## 2022-12-08 DIAGNOSIS — I129 Hypertensive chronic kidney disease with stage 1 through stage 4 chronic kidney disease, or unspecified chronic kidney disease: Secondary | ICD-10-CM | POA: Diagnosis not present

## 2022-12-08 DIAGNOSIS — I69354 Hemiplegia and hemiparesis following cerebral infarction affecting left non-dominant side: Secondary | ICD-10-CM | POA: Diagnosis not present

## 2022-12-08 DIAGNOSIS — N179 Acute kidney failure, unspecified: Secondary | ICD-10-CM | POA: Diagnosis not present

## 2022-12-08 DIAGNOSIS — M25552 Pain in left hip: Secondary | ICD-10-CM | POA: Diagnosis not present

## 2022-12-08 DIAGNOSIS — N183 Chronic kidney disease, stage 3 unspecified: Secondary | ICD-10-CM | POA: Diagnosis not present

## 2022-12-14 DIAGNOSIS — M25552 Pain in left hip: Secondary | ICD-10-CM | POA: Diagnosis not present

## 2022-12-14 DIAGNOSIS — N183 Chronic kidney disease, stage 3 unspecified: Secondary | ICD-10-CM | POA: Diagnosis not present

## 2022-12-14 DIAGNOSIS — F01B4 Vascular dementia, moderate, with anxiety: Secondary | ICD-10-CM | POA: Diagnosis not present

## 2022-12-14 DIAGNOSIS — N179 Acute kidney failure, unspecified: Secondary | ICD-10-CM | POA: Diagnosis not present

## 2022-12-14 DIAGNOSIS — G8929 Other chronic pain: Secondary | ICD-10-CM | POA: Diagnosis not present

## 2022-12-14 DIAGNOSIS — M1712 Unilateral primary osteoarthritis, left knee: Secondary | ICD-10-CM | POA: Diagnosis not present

## 2022-12-14 DIAGNOSIS — I129 Hypertensive chronic kidney disease with stage 1 through stage 4 chronic kidney disease, or unspecified chronic kidney disease: Secondary | ICD-10-CM | POA: Diagnosis not present

## 2022-12-14 DIAGNOSIS — I69354 Hemiplegia and hemiparesis following cerebral infarction affecting left non-dominant side: Secondary | ICD-10-CM | POA: Diagnosis not present

## 2022-12-14 DIAGNOSIS — M549 Dorsalgia, unspecified: Secondary | ICD-10-CM | POA: Diagnosis not present

## 2022-12-21 DIAGNOSIS — M25552 Pain in left hip: Secondary | ICD-10-CM | POA: Diagnosis not present

## 2022-12-21 DIAGNOSIS — M549 Dorsalgia, unspecified: Secondary | ICD-10-CM | POA: Diagnosis not present

## 2022-12-21 DIAGNOSIS — M1712 Unilateral primary osteoarthritis, left knee: Secondary | ICD-10-CM | POA: Diagnosis not present

## 2022-12-21 DIAGNOSIS — N179 Acute kidney failure, unspecified: Secondary | ICD-10-CM | POA: Diagnosis not present

## 2022-12-21 DIAGNOSIS — F01B4 Vascular dementia, moderate, with anxiety: Secondary | ICD-10-CM | POA: Diagnosis not present

## 2022-12-21 DIAGNOSIS — N183 Chronic kidney disease, stage 3 unspecified: Secondary | ICD-10-CM | POA: Diagnosis not present

## 2022-12-21 DIAGNOSIS — I129 Hypertensive chronic kidney disease with stage 1 through stage 4 chronic kidney disease, or unspecified chronic kidney disease: Secondary | ICD-10-CM | POA: Diagnosis not present

## 2022-12-21 DIAGNOSIS — G8929 Other chronic pain: Secondary | ICD-10-CM | POA: Diagnosis not present

## 2022-12-21 DIAGNOSIS — I69354 Hemiplegia and hemiparesis following cerebral infarction affecting left non-dominant side: Secondary | ICD-10-CM | POA: Diagnosis not present

## 2022-12-22 ENCOUNTER — Ambulatory Visit (INDEPENDENT_AMBULATORY_CARE_PROVIDER_SITE_OTHER): Payer: Medicare HMO | Admitting: Podiatry

## 2022-12-22 ENCOUNTER — Ambulatory Visit: Payer: Medicare Other | Admitting: Podiatry

## 2022-12-22 DIAGNOSIS — M79675 Pain in left toe(s): Secondary | ICD-10-CM

## 2022-12-22 DIAGNOSIS — L84 Corns and callosities: Secondary | ICD-10-CM | POA: Diagnosis not present

## 2022-12-22 DIAGNOSIS — I739 Peripheral vascular disease, unspecified: Secondary | ICD-10-CM

## 2022-12-22 DIAGNOSIS — L89520 Pressure ulcer of left ankle, unstageable: Secondary | ICD-10-CM

## 2022-12-22 DIAGNOSIS — B351 Tinea unguium: Secondary | ICD-10-CM

## 2022-12-22 DIAGNOSIS — M79674 Pain in right toe(s): Secondary | ICD-10-CM

## 2022-12-22 NOTE — Progress Notes (Signed)
Subjective:  Patient ID: Felicia Little, female    DOB: [birth year],  MRN: [id number]  77 y.o. female presents to clinic with  at risk foot care. Patient has h/o PAD and callus(es) of both feet and painful thick toenails that are difficult to trim. Painful toenails interfere with ambulation. Aggravating factors include wearing enclosed shoe gear. Pain is relieved with periodic professional debridement. Painful calluses are aggravated when weightbearing with and without shoegear. Pain is relieved with periodic professional debridement.  Chief Complaint  Patient presents with   Nail Problem    RFC,Referring Provider Alm Bustard, NP,lov:09/23        New problem(s):  Patient relates new onset of pain on outside of left ankle for the past couple of weeks States it is stinging/burning. Relates no episode of trauma. Has not attempted treatment.  Patient relates she sleeps on her left side.  PCP is Fields, Lisabeth Pick, NP.  Allergies  Allergen Reactions   Other Swelling    VARIOUS METALS cause internal swelling    Review of Systems: Negative except as noted in the HPI.   Objective:  Felicia Little is a pleasant 77 y.o. female WD, WN in NAD.Marland Kitchen  Vascular Examination: Vascular status intact b/l with faintly palpable pedal pulses. CFT immediate b/l. No edema. No pain with calf compression b/l. Skin temperature gradient WNL b/l. Varicosities present b/l.  Neurological Examination: Sensation grossly intact b/l with 10 gram monofilament. Vibratory sensation intact b/l.   Dermatological Examination: Pedal skin with normal turgor, texture and tone b/l. Toenails 1-5 b/l thick, discolored, elongated with subungual debris and pain on dorsal palpation. No hyperkeratotic lesions noted b/l.   Area of pressure noted left lateral malleolus with tenderness to palpation. Unstageable. No erythema, no edema, no drainage, no fluctuance.  Musculoskeletal Examination: Muscle strength 5/5 to b/l LE.  Plantarflexed metatarsal(s) 5th metatarsal head of both feet.  Radiographs: None  Assessment:   1. Pain due to onychomycosis of toenails of both feet   2. Callus of foot   3. Pressure injury of left ankle, unstageable (HCC)   4. PVD (peripheral vascular disease) (HCC)    Plan:  Patient was evaluated and treated. All patient's and/or POA's questions/concerns addressed on today's visit. Discussed pressure injury left lateral malleolus. Recommended heel protectors for when in bed. Do not walk with heel protectors on as this poses a fall risk. Patient/Daughter related understanding. Mycotic toenails 1-5 debrided in length and girth without incident. Callus(es) submet head 5 b/l pared with sharp debridement without incident. Continue soft, supportive shoe gear daily. Report any pedal injuries to medical professional. Call office if there are any questions/concerns. -Patient/POA to call should there be question/concern in the interim.  Return in about 3 months (around 03/23/2023).  Freddie Breech, DPM

## 2022-12-26 ENCOUNTER — Encounter: Payer: Self-pay | Admitting: Podiatry

## 2022-12-27 ENCOUNTER — Emergency Department: Payer: Medicare HMO

## 2022-12-27 ENCOUNTER — Emergency Department
Admission: EM | Admit: 2022-12-27 | Discharge: 2022-12-28 | Disposition: A | Discharge status: Home / Self Care | Payer: Medicare HMO | Attending: Emergency Medicine | Admitting: Emergency Medicine

## 2022-12-27 DIAGNOSIS — R531 Weakness: Secondary | ICD-10-CM | POA: Insufficient documentation

## 2022-12-27 DIAGNOSIS — E860 Dehydration: Secondary | ICD-10-CM | POA: Diagnosis not present

## 2022-12-27 DIAGNOSIS — N390 Urinary tract infection, site not specified: Secondary | ICD-10-CM | POA: Diagnosis not present

## 2022-12-27 DIAGNOSIS — K8689 Other specified diseases of pancreas: Secondary | ICD-10-CM | POA: Diagnosis not present

## 2022-12-27 DIAGNOSIS — R4182 Altered mental status, unspecified: Secondary | ICD-10-CM | POA: Diagnosis not present

## 2022-12-27 DIAGNOSIS — R197 Diarrhea, unspecified: Secondary | ICD-10-CM | POA: Diagnosis not present

## 2022-12-27 DIAGNOSIS — D649 Anemia, unspecified: Secondary | ICD-10-CM | POA: Diagnosis not present

## 2022-12-27 DIAGNOSIS — N281 Cyst of kidney, acquired: Secondary | ICD-10-CM | POA: Diagnosis not present

## 2022-12-27 DIAGNOSIS — I10 Essential (primary) hypertension: Secondary | ICD-10-CM | POA: Diagnosis not present

## 2022-12-27 DIAGNOSIS — E876 Hypokalemia: Secondary | ICD-10-CM | POA: Diagnosis not present

## 2022-12-27 DIAGNOSIS — K5730 Diverticulosis of large intestine without perforation or abscess without bleeding: Secondary | ICD-10-CM | POA: Diagnosis not present

## 2022-12-27 DIAGNOSIS — Z8673 Personal history of transient ischemic attack (TIA), and cerebral infarction without residual deficits: Secondary | ICD-10-CM | POA: Diagnosis not present

## 2022-12-27 DIAGNOSIS — R1030 Lower abdominal pain, unspecified: Secondary | ICD-10-CM | POA: Diagnosis not present

## 2022-12-27 DIAGNOSIS — I6782 Cerebral ischemia: Secondary | ICD-10-CM | POA: Diagnosis not present

## 2022-12-27 LAB — CBC
HCT: 32.4 % — ABNORMAL LOW (ref 36.0–46.0)
Hemoglobin: 10.1 g/dL — ABNORMAL LOW (ref 12.0–15.0)
MCH: 28.6 pg (ref 26.0–34.0)
MCHC: 31.2 g/dL (ref 30.0–36.0)
MCV: 91.8 fL (ref 80.0–100.0)
Platelets: 194 10*3/uL (ref 150–400)
RBC: 3.53 MIL/uL — ABNORMAL LOW (ref 3.87–5.11)
RDW: 14.2 % (ref 11.5–15.5)
WBC: 5.8 10*3/uL (ref 4.0–10.5)
nRBC: 0 % (ref 0.0–0.2)

## 2022-12-27 LAB — COMPREHENSIVE METABOLIC PANEL
ALT: 12 U/L (ref 0–44)
AST: 18 U/L (ref 15–41)
Albumin: 2.7 g/dL — ABNORMAL LOW (ref 3.5–5.0)
Alkaline Phosphatase: 47 U/L (ref 38–126)
Anion gap: 10 (ref 5–15)
BUN: 11 mg/dL (ref 8–23)
CO2: 22 mmol/L (ref 22–32)
Calcium: 8.6 mg/dL — ABNORMAL LOW (ref 8.9–10.3)
Chloride: 107 mmol/L (ref 98–111)
Creatinine, Ser: 0.97 mg/dL (ref 0.44–1.00)
GFR, Estimated: 59 mL/min — ABNORMAL LOW (ref >60)
Glucose, Bld: 86 mg/dL (ref 70–99)
Potassium: 3.2 mmol/L — ABNORMAL LOW (ref 3.5–5.1)
Sodium: 139 mmol/L (ref 135–145)
Total Bilirubin: 0.8 mg/dL (ref 0.3–1.2)
Total Protein: 6.2 g/dL — ABNORMAL LOW (ref 6.5–8.1)

## 2022-12-27 LAB — LIPASE, BLOOD: Lipase: 23 U/L (ref 11–51)

## 2022-12-27 MED ORDER — ONDANSETRON HCL 4 MG/2ML IJ SOLN
4.0000 mg | Freq: Once | INTRAMUSCULAR | Status: AC
  Administered 2022-12-27: 4 mg via INTRAVENOUS
  Filled 2022-12-27: qty 2

## 2022-12-27 MED ORDER — SODIUM CHLORIDE 0.9 % IV BOLUS
1000.0000 mL | Freq: Once | INTRAVENOUS | Status: AC
  Administered 2022-12-27: 1000 mL via INTRAVENOUS

## 2022-12-27 MED ORDER — IOHEXOL 300 MG/ML  SOLN
100.0000 mL | Freq: Once | INTRAMUSCULAR | Status: AC | PRN
  Administered 2022-12-27: 100 mL via INTRAVENOUS

## 2022-12-27 NOTE — ED Notes (Signed)
Pt to CT

## 2022-12-27 NOTE — ED Notes (Signed)
Peri care provided and clean brief placed on pt.

## 2022-12-27 NOTE — ED Provider Notes (Signed)
[facility] Provider Note    Event Date/Time   First MD Initiated Contact with Patient 12/27/22 2302     (approximate)   History   Diarrhea   HPI  Felicia Little is a 77 year old female with history of hypertension, prior CVA with residual left-sided deficits, probable vascular dementia presenting to the emergency department for evaluation of weakness.  Accompanied by her daughters provide collateral history.  They report on Friday patient developed diarrhea with generalized weakness.  Since then, patient has had episodes of waxing and waning interactiveness with decreased ability to ambulate. They report that this is not uncommon for her in the setting of acute illness.  Her diarrhea has been nonbloody.  She denies nausea or vomiting.  Denied abdominal pain in triage, but does report lower abdominal pain to me.  No fevers.  No recent antibiotic use.     Physical Exam   Triage Vital Signs: ED Triage Vitals  Encounter Vitals Group     BP 12/27/22 1846 133/68     Systolic BP Percentile --      Diastolic BP Percentile --      Pulse Rate 12/27/22 1846 68     Resp 12/27/22 1846 16     Temp 12/27/22 1846 98.8 F (37.1 C)     Temp Source 12/27/22 1846 Oral     SpO2 12/27/22 1846 100 %     Weight --      Height --      Head Circumference --      Peak Flow --      Pain Score 12/27/22 1847 0     Pain Loc --      Pain Education --      Exclude from Growth Chart --     Most recent vital signs: Vitals:   12/27/22 2216 12/27/22 2217  BP:    Pulse: 63   Resp:  18  Temp:    SpO2: 94%      General: Awake, interactive   CV:  Regular rate, good peripheral perfusion.  Resp:  Lungs clear, unlabored respirations.  Abd:  Soft, nondistended, mild tender to palpation over the lower abdomen Neuro:  Keenly aware, correctly answers month and age, able to blink eyes and squeeze hands, normal horizontal extraocular movements, no visual field loss, normal facial  symmetry, mild weakness of the left arm and leg, mild ataxia of the left upper extremity, consistent with baseline deficits per family, normal sensation, no aphasia, dysarthria, inattention  ED Results / Procedures / Treatments   Labs (all labs ordered are listed, but only abnormal results are displayed) Labs Reviewed  COMPREHENSIVE METABOLIC PANEL - Abnormal; Notable for the following components:      Result Value   Potassium 3.2 (*)    Calcium 8.6 (*)    Total Protein 6.2 (*)    Albumin 2.7 (*)    GFR, Estimated 59 (*)    All other components within normal limits  CBC - Abnormal; Notable for the following components:   RBC 3.53 (*)    Hemoglobin 10.1 (*)    HCT 32.4 (*)    All other components within normal limits  LIPASE, BLOOD  URINALYSIS, ROUTINE W REFLEX MICROSCOPIC     EKG EKG independently reviewed interpreted by myself (ER attending) demonstrates:    RADIOLOGY Imaging independently reviewed and interpreted by myself demonstrates:  CT head pending CT abdomen pelvis pending  PROCEDURES:  Critical Care performed: No  Procedures  MEDICATIONS ORDERED IN ED: Medications  sodium chloride 0.9 % bolus 1,000 mL (1,000 mLs Intravenous New Bag/Given 12/27/22 2239)  ondansetron (ZOFRAN) injection 4 mg (4 mg Intravenous Given 12/27/22 2235)  iohexol (OMNIPAQUE) 300 MG/ML solution 100 mL (100 mLs Intravenous Contrast Given 12/27/22 2248)     IMPRESSION / MDM / ASSESSMENT AND PLAN / ED COURSE  I reviewed the triage vital signs and the nursing notes.  Differential diagnosis includes, but is not limited to, diverticulitis, colitis, viral illness, dehydration, consideration for acute intracranial process, low suspicion CVA in the absence of any new deficits  Patient's presentation is most consistent with acute presentation with potential threat to life or bodily function.  77 year old female presenting with diarrhea and weakness.  Mental status here seems improved, no new  deficits from her baseline.  Labs with hypokalemia, creatinine at baseline.  Anemia present, not new for patient.  Ordered for symptomatic treatment with IV fluids and Zofran.  Normal lipase.  Urinalysis pending.  CT head and abdomen pelvis pending.  If patient's workup is reassuring and she is tolerating p.o., do think discharge with outpatient follow-up is reasonable.  Signed out to oncoming physician at 2300.    FINAL CLINICAL IMPRESSION(S) / ED DIAGNOSES   Final diagnoses:  Diarrhea, unspecified type  Weakness generalized     Rx / DC Orders   ED Discharge Orders     None        Note:  This document was prepared using Dragon voice recognition software and may include unintentional dictation errors.   Trinna Post, MD 12/27/22 [phone]

## 2022-12-27 NOTE — ED Triage Notes (Signed)
Pt arrives via [facility] from home for diarrhea and generalized weakness for the last four days. Pt denies N/V, no abd pain, no recent fevers. No recent abx use per pt.

## 2022-12-28 LAB — URINALYSIS, ROUTINE W REFLEX MICROSCOPIC
Bilirubin Urine: NEGATIVE
Glucose, UA: NEGATIVE mg/dL
Ketones, ur: NEGATIVE mg/dL
Nitrite: POSITIVE — AB
Protein, ur: 30 mg/dL — AB
RBC / HPF: >50 RBC/hpf (ref 0–5)
Specific Gravity, Urine: >1.046 — ABNORMAL HIGH (ref 1.005–1.030)
WBC, UA: >50 WBC/hpf (ref 0–5)
pH: 6 (ref 5.0–8.0)

## 2022-12-28 MED ORDER — CEPHALEXIN 500 MG PO CAPS: 500.0000 mg | ORAL_CAPSULE | Freq: Four times a day (QID) | ORAL | 0 refills | Status: AC

## 2022-12-28 MED ORDER — CEPHALEXIN 500 MG PO CAPS
500.0000 mg | ORAL_CAPSULE | Freq: Once | ORAL | Status: AC
  Administered 2022-12-28: 500 mg via ORAL
  Filled 2022-12-28: qty 1

## 2022-12-28 NOTE — ED Provider Notes (Signed)
Patient stable eager to go home but urinalysis pending, will await results.  Looks like urinary tract infection first dose of antibiotic given in the emergency department from a prescription for the same, discharge.   Pilar Jarvis, MD 12/28/22 [phone]

## 2023-01-02 DIAGNOSIS — M25551 Pain in right hip: Secondary | ICD-10-CM | POA: Diagnosis not present

## 2023-01-02 DIAGNOSIS — R29898 Other symptoms and signs involving the musculoskeletal system: Secondary | ICD-10-CM | POA: Diagnosis not present

## 2023-01-02 DIAGNOSIS — M25552 Pain in left hip: Secondary | ICD-10-CM | POA: Diagnosis not present

## 2023-01-02 DIAGNOSIS — M25559 Pain in unspecified hip: Secondary | ICD-10-CM | POA: Diagnosis not present

## 2023-01-02 DIAGNOSIS — Z8673 Personal history of transient ischemic attack (TIA), and cerebral infarction without residual deficits: Secondary | ICD-10-CM | POA: Diagnosis not present

## 2023-01-02 DIAGNOSIS — M25511 Pain in right shoulder: Secondary | ICD-10-CM | POA: Diagnosis not present

## 2023-01-02 DIAGNOSIS — R531 Weakness: Secondary | ICD-10-CM | POA: Diagnosis not present

## 2023-01-02 DIAGNOSIS — Z9181 History of falling: Secondary | ICD-10-CM | POA: Diagnosis not present

## 2023-01-02 DIAGNOSIS — R29818 Other symptoms and signs involving the nervous system: Secondary | ICD-10-CM | POA: Diagnosis not present

## 2023-02-22 ENCOUNTER — Other Ambulatory Visit: Payer: Self-pay | Admitting: Family Medicine

## 2023-02-22 DIAGNOSIS — R1310 Dysphagia, unspecified: Secondary | ICD-10-CM

## 2023-03-01 ENCOUNTER — Ambulatory Visit
Admission: RE | Admit: 2023-03-01 | Discharge: 2023-03-01 | Disposition: A | Discharge status: Home / Self Care | Payer: Medicare Other | Source: Ambulatory Visit | Attending: Family Medicine | Admitting: Family Medicine

## 2023-03-01 DIAGNOSIS — R1310 Dysphagia, unspecified: Secondary | ICD-10-CM | POA: Insufficient documentation

## 2023-03-01 NOTE — Progress Notes (Signed)
Modified Barium Swallow Study  Patient Details  Name: Felicia Little MRN: [id number] Date of Birth: 06-30-1945  Today's Date: 03/01/2023  Modified Barium Swallow completed.  Full report located under Chart Review in the Imaging Section.  History of Present Illness Pt is a 77 year old female with history of hypertension, prior CVA with residual left-sided deficits, probable vascular dementia, Anemia with hemoglobin 10.6 (decreased from 13 previously) on MD visit 10/24.  Pt c/o "not eating well and feels slow and sluggish and tired even after resting", per MD note. Pt has stated difficulty swallowing, since her stroke. She feels as if her muscles are not wanting to swallow while eating. She has also been having difficulty with writing due to her hands being affected, worse on the left than the right.  Pt is followed by Neurology.   Imaging in 12/2022: Head CT- No CT evidence for acute intracranial abnormality.  2. Atrophy and chronic small vessel ischemic changes of the white  matter. Remote lacunar infarcts in the pons and left thalamus.  CXR in 11/2022: negative.   Pt denies any weight loss but notes that she does not eat "much in a day". Pt does not wear Bottom Denture plate(ill-fit). Daughters are aware of the same and state they encourage Ensure but that pt "still smokes too".  NO dx'd pneumonia per chart notes.   Clinical Impression Patient presents with grossly functional oropharyngeal phase swallowing for age and s/p stroke ~1 year ago per report. No aspiration occurred during this study.   Oral stage is characterized by adequate lip closure, bolus preparation and containment, and anterior to posterior transit. Min increased oral phase time required for gumming/chewing of increased textured food trial d/t No Bottom Dentition(does not wear denture plate). Full oral clearing noted w/ all consistencies post swallow.  Swallow initiation occurs primarily at the level of the valleculae. OF NOTE: during  the swallow, pt exhibited what appeared to be a hesitation of bolus movement through the pharynx at the juncture w/in the pharynx b/t the Valleculae and Pyriform Sinuses, for all consistencies. This is what pt stated "bothered her about her swallowing since my stroke last year" and indicated makes her swallowing feel "different".   Pharyngeal stage is noted for adequate tongue base retraction, adequate hyolaryngeal excursion, and adequate pharyngeal constriction. Epiglottic deflection is fairly complete; there was No consistent penetration and No aspiration of bolus consistencies occurred. There was no valleculae nor pyriform sinus residue indicating adequate hyolaryngeal excursion and pressure during the swallow. Pharyngeal stripping wave is complete.  Amplitude/duration of cricopharyngeus opening is WFL. There is adequate/complete clearance through the cervical esophagus. An esophageal sweep was performed in the upright lateral position which was unremarkable for clearing of the barium tablet. The 13 mm barium tablet was given in tsp of Puree for ease of swallow d/t her c/o difficulty w/ "larger pills".   Consistencies tested were thin liquids x1 tsps, 2 cup sip, 3 sequential sips via straw, nectar x1 tsp, 1 cup sip, 2 sequential cup sips, honey x1 tsp, pudding x1 tsp, regular solid (1/2 graham cracker with pudding), and 13 mm barium tablet with puree. Pt reported no feelings of difficulty swallowing during study.   Recommend patient continue fairly regular diet w/ well-cut or chopped meats for ease of chewing; gravies added to moisten. Thin liquids.  Educated pt and Daughters verbally and w/ model on general aspiration precautions including Small, single sips and eating slowly; reduced distractions at meals. Pills in Puree. No further ST indicated as  pt's swallowing presentation is functional and consistent w/ all consistencies. Factors that may increase risk of adverse event in presence of aspiration  [name] & [name] 2021): Frail or deconditioned (Lacking bottom Dentition (all))  Swallow Evaluation Recommendations Recommendations: PO diet PO Diet Recommendation: Dysphagia 3 (Mechanical soft);Thin liquids (Level 0) (>regular; moistened/chopped meats and foods for ease of chewing and swallowing) Liquid Administration via: Cup;Straw (monitor) Medication Administration: Whole meds with puree (broken small) Supervision: Patient able to self-feed (support) Swallowing strategies  : Minimize environmental distractions;Slow rate;Small bites/sips;Follow solids with liquids Postural changes: Position pt fully upright for meals;Stay upright 30-60 min after meals;Out of bed for meals Oral care recommendations: Oral care BID (2x/day);Pt independent with oral care Recommended consults: Consider dietitian consultation (d/t report of reduced oral intake in general)        Jerilynn Som, MS, CCC-SLP Speech Language Pathologist Rehab Services; [facility] [place] [phone] (ascom) [name] 03/01/2023,4:59 PM

## 2023-03-20 ENCOUNTER — Ambulatory Visit (INDEPENDENT_AMBULATORY_CARE_PROVIDER_SITE_OTHER): Payer: Medicare Other | Admitting: Podiatry

## 2023-03-20 DIAGNOSIS — Z91199 Patient's noncompliance with other medical treatment and regimen due to unspecified reason: Secondary | ICD-10-CM

## 2023-03-20 NOTE — Progress Notes (Signed)
1. No-show for appointment     

## 2023-05-11 ENCOUNTER — Ambulatory Visit: Payer: Medicare Other | Admitting: Podiatry

## 2023-05-11 ENCOUNTER — Encounter: Payer: Self-pay | Admitting: Podiatry

## 2023-05-11 VITALS — Ht 65.0 in | Wt 168.0 lb

## 2023-05-11 DIAGNOSIS — B351 Tinea unguium: Secondary | ICD-10-CM

## 2023-05-11 DIAGNOSIS — M79675 Pain in left toe(s): Secondary | ICD-10-CM

## 2023-05-11 DIAGNOSIS — I739 Peripheral vascular disease, unspecified: Secondary | ICD-10-CM

## 2023-05-11 DIAGNOSIS — M79674 Pain in right toe(s): Secondary | ICD-10-CM

## 2023-05-11 DIAGNOSIS — L84 Corns and callosities: Secondary | ICD-10-CM

## 2023-05-21 NOTE — Progress Notes (Signed)
  Subjective:  Patient ID: Felicia Little, female    DOB: 1946-02-10,  MRN: [id number]  78 y.o. female presents with at risk foot care. Patient has h/o PAD and painful, elongated thickened toenails x 10 which are symptomatic when wearing enclosed shoe gear. This interferes with his/her daily activities. Chief Complaint  Patient presents with   Nail Problem    Pt is here for [facility] PCP is Dr Harvey and LOV was in October.     PCP: Harvey Gaetana CROME, NP.  New problem(s): None.   Review of Systems: Negative except as noted in the HPI.   Allergies  Allergen Reactions   Other Swelling    VARIOUS METALS cause internal swelling    Objective:  There were no vitals filed for this visit. Constitutional Patient is a pleasant 78 y.o. female WD, WN in NAD. AAO x 3.  Vascular Capillary fill time to digits <3 seconds.  DP/PT pulse(s) are faintly palpable b/l lower extremities. Pedal hair absent b/l. Lower extremity skin temperature gradient warm to cool b/l. No pain with calf compression b/l. No cyanosis or clubbing noted. No ischemia nor gangrene noted b/l. Varicosities present b/l.  Neurologic Protective sensation intact 5/5 intact bilaterally with 10g monofilament b/l. Vibratory sensation intact b/l. No clonus b/l.   Dermatologic Pedal skin is thin, shiny and atrophic b/l.  No open wounds b/l lower extremities. No interdigital macerations b/l lower extremities. Toenails 1-5 b/l elongated, discolored, dystrophic, thickened, crumbly with subungual debris and tenderness to dorsal palpation. Hyperkeratotic lesion(s) submet head 5 b/l.  No erythema, no edema, no drainage, no fluctuance.  Orthopedic: Normal muscle strength 5/5 to all lower extremity muscle groups bilaterally. Plantarflexed metatarsal(s) 5th metatarsal head of both feet.   Last HgA1c:      No data to display           Assessment:   1. Pain due to onychomycosis of toenails of both feet   2. Callus of foot   3. PVD (peripheral vascular  disease) (HCC)    Plan:  Consent given for treatment as described below: -Patient to continue soft, supportive shoe gear daily. -Toenails 1-5 b/l were debrided in length and girth with sterile nail nippers without iatrogenic bleeding.  -Callus(es) submet head 5 b/l pared utilizing sterile scalpel blade without complication or incident. Total number debrided =2. -Patient/POA to call should there be question/concern in the interim.  Return in about 3 months (around 08/08/2023).  Delon CROME Merlin, DPM      [place] LOCATION: [address] [place] [address]                   Office [phone]   [facility] LOCATION: [address] [place], [place] [address] Office [phone]

## 2023-06-27 NOTE — Progress Notes (Signed)
 History of Present Illness:   Felicia Little is a 78 y.o. female here for   Verbally consented to the use of AI for note-taking.   Chief Complaint  Patient presents with  . Follow-up    History of Present Illness Felicia Little is a 78 year old female who presents for follow up. She is accompanied by her daughter, who is actively involved in her care.  She experiences increased nocturia, requiring her to use the bathroom three times a night. Previously, she used Gemtesa  for bladder control, which was effective, but she has not been on it for over six months due to cost.  She has been prescribed mirtazapine but has not started it due to concerns about potential side effects, such as confusion. Her daughters discussed these concerns and decided to wait before starting the medication. She has a history of taking Cymbalta  for pain and mood, but has not been using it recently.  She is currently taking pregabalin  for nerve and muscle pain, with a regimen of one capsule in the morning and two in the evening, although she has only been taking the morning dose due to side effects like feeling 'loopy' upon waking when taking the evening dose.  Her blood pressure is elevated at today's office visit, and she is currently taking amlodipine  in the morning.  She reports dizziness and has a high salt intake, which may contribute to her elevated blood pressure.  She wants to simplify her medication regimen to improve adherence, as she often skips nighttime doses.  She experiences persistent postnasal drip, for which she carries tissues constantly. Her sleep is disrupted, as she reports not sleeping at all one night and frequently staying up until 1 or 2 AM.  She spends her days mostly sitting, drinking coffee, and smoking cigarettes, with minimal physical activity. She lives alone except for her younger daughter, who works full-time.   Past Medical History:   Past Medical History:  Diagnosis Date  .  Anxiety and depression 10/21/2014   Last Assessment & Plan:  Intermittent anxiety, overall manages on her own. Has refused other treatment in the past as she only wants Benzos. Will not be prescribing benzos.  . Chronic pain 04/23/2015   Last Assessment & Plan:  Refills provided for Tramadol . No suspicious activity noted on PMP aware site.  . Hyperlipidemia 10/22/2014   Last Assessment & Plan:  Recent lipid panel stable, continue pravastatin .  . Hypertension     Past Surgical History:   Past Surgical History:  Procedure Laterality Date  . Left THA Left 10/25/2004   Dr. Tess Lander   . BACK SURGERY  04/04/2012  . REPLACEMENT TOTAL HIP W/  RESURFACING IMPLANTS      Allergies:  No Known Allergies  Current Medications:   Prior to Admission medications   Medication Sig Taking? Last Dose  acetaminophen  (TYLENOL ) 500 MG tablet Take 500 mg by mouth every 8 (eight) hours as needed for Pain Taking PRN Yes PRN Not Currently Taking  aspirin  81 MG EC tablet Take 81 mg by mouth once daily Yes Taking  atorvastatin  (LIPITOR) 80 MG tablet Take 1 tablet (80 mg total) by mouth once daily for 90 days Yes   cholecalciferol (VITAMIN D3) 2,000 unit tablet Take 1 tablet (2,000 Units total) by mouth once daily Yes Taking  donepeziL  (ARICEPT ) 10 MG tablet Take 1 tablet (10 mg total) by mouth at bedtime Yes   mirtazapine (REMERON) 7.5 MG tablet Take 1 tablet (7.5 mg  total) by mouth at bedtime Yes Taking  multivitamin tablet Take 1 tablet by mouth once daily Yes Taking  pregabalin  (LYRICA ) 75 MG capsule Take 75 mg in the morning and 150 mg at night Patient taking differently: Take 75 mg by mouth once daily Take 75 mg in the morning and 150 mg at night Yes Taking  amLODIPine  (NORVASC ) 10 MG tablet Take 1 tablet (10 mg total) by mouth once daily    metoprolol SUCCinate (TOPROL-XL) 50 MG XL tablet Take 1 tablet (50 mg total) by mouth once daily    montelukast (SINGULAIR) 10 mg tablet Take 1 tablet (10 mg total)  by mouth at bedtime    trospium  (SANCTURA ) 20 mg tablet Take 1 tablet (20 mg total) by mouth at bedtime      Family History:   Family History  Problem Relation Name Age of Onset  . Myocardial Infarction (Heart attack) Mother    . Alzheimer's disease Mother    . Diabetes Daughter      Social History:   Social History   Socioeconomic History  . Marital status: Single  Tobacco Use  . Smoking status: Every Day    Current packs/day: 0.50    Average packs/day: 0.5 packs/day for 15.0 years (7.5 ttl pk-yrs)    Types: Cigarettes  . Smokeless tobacco: Never  Vaping Use  . Vaping status: Never Used  Substance and Sexual Activity  . Alcohol use: No  . Drug use: Never  . Sexual activity: Defer   Social Drivers of Health   Financial Resource Strain: Low Risk  (01/19/2023)   Overall Financial Resource Strain (CARDIA)   . Difficulty of Paying Living Expenses: Not hard at all  Food Insecurity: Unknown (01/19/2023)   Hunger Vital Sign   . Worried About Programme researcher, broadcasting/film/video in the Last Year: Never true  Transportation Needs: No Transportation Needs (01/19/2023)   PRAPARE - Transportation   . Lack of Transportation (Medical): No   . Lack of Transportation (Non-Medical): No  Housing Stability: Unknown (06/27/2023)   Housing Stability Vital Sign   . Unable to Pay for Housing in the Last Year: No   . Homeless in the Last Year: No    Review of Systems:   A 10 point review of systems is negative, except for the pertinent positives and negatives detailed in the HPI.  Vitals:   Vitals:   06/27/23 1457  BP: (!) 156/92  Pulse: 69  SpO2: 99%  Weight: 70.4 kg (155 lb 3.2 oz)     Body mass index is 27.49 kg/m.  Physical Exam:   Physical Exam Vitals and nursing note reviewed.  Constitutional:      General: She is not in acute distress.    Appearance: Normal appearance. She is not ill-appearing, toxic-appearing or diaphoretic.  HENT:     Head: Normocephalic and atraumatic.      Right Ear: External ear normal.     Left Ear: External ear normal.  Eyes:     General:        Right eye: No discharge.        Left eye: No discharge.     Conjunctiva/sclera: Conjunctivae normal.  Cardiovascular:     Rate and Rhythm: Normal rate and regular rhythm.     Pulses: Normal pulses.     Heart sounds: Normal heart sounds. No murmur heard.    No friction rub. No gallop.  Pulmonary:     Effort: Pulmonary effort is normal. No respiratory distress.  Breath sounds: Normal breath sounds. No stridor. No wheezing, rhonchi or rales.  Chest:     Chest wall: No tenderness.  Skin:    General: Skin is warm and dry.     Capillary Refill: Capillary refill takes less than 2 seconds.  Neurological:     Mental Status: She is alert.     Assessment and Plan:  No results found for this visit on 06/27/23.  Diagnoses and all orders for this visit:  Vitamin D deficiency -     CBC w/auto Differential (5 Part) -     Comprehensive Metabolic Panel (CMP) -     Vitamin D, 25-Hydroxy - [facility]  Need for vaccination  Essential hypertension -     CBC w/auto Differential (5 Part) -     Comprehensive Metabolic Panel (CMP) -     Vitamin D, 25-Hydroxy - [facility]  Pure hypertriglyceridemia -     CBC w/auto Differential (5 Part) -     Comprehensive Metabolic Panel (CMP) -     Vitamin D, 25-Hydroxy - [facility]  Prediabetes (add to Problem List) -     CBC w/auto Differential (5 Part) -     Comprehensive Metabolic Panel (CMP) -     Vitamin D, 25-Hydroxy - [facility] -     Hemoglobin A1C  Stage 3b chronic kidney disease (CMS/HHS-HCC)  Other orders -     trospium  (SANCTURA ) 20 mg tablet; Take 1 tablet (20 mg total) by mouth at bedtime -     metoprolol SUCCinate (TOPROL-XL) 50 MG XL tablet; Take 1 tablet (50 mg total) by mouth once daily -     amLODIPine  (NORVASC ) 10 MG tablet; Take 1 tablet (10 mg total) by mouth once daily -     montelukast (SINGULAIR) 10 mg tablet; Take 1 tablet (10 mg total) by  mouth at bedtime -     atorvastatin  (LIPITOR) 80 MG tablet; Take 1 tablet (80 mg total) by mouth once daily for 90 days -     donepeziL  (ARICEPT ) 10 MG tablet; Take 1 tablet (10 mg total) by mouth at bedtime    Assessment & Plan Overactive Bladder She experiences increased nocturia, requiring multiple diaper changes at night. Previously on Gemtesa , which was effective but costly. Trospium  citrate is considered as a more affordable alternative. - Prescribe trospium  citrate for 30 days to assess effectiveness.  Hypertension Her blood pressure is elevated due to inconsistent nighttime medication adherence. Currently on morning amlodipine  and twice daily Coreg . Switching to metoprolol XL, a longer-acting medication, will allow for once-daily dosing, improving adherence. - Switch carvedilol  to metoprolol for once-daily dosing. - Increase amlodipine  to 10 mg once daily.  Chronic Pain She experiences chronic pain in the hip, shoulder, back, and legs. Currently on pregabalin  for nerve and muscle pain. Cymbalta  was considered but not taken. Mirtazapine is considered to aid sleep and appetite, indirectly improving pain management. - Continue pregabalin  in the morning only. - Start mirtazapine to help with sleep and appetite.  Insomnia She reports difficulty sleeping, often staying awake throughout the night. Mirtazapine is considered to help with sleep and appetite. Initial side effects may include grogginess, expected to resolve with continued use. Starting at a low dose may minimize side effects. - Start mirtazapine, beginning with half a tablet (3.75 mg) for the first week to minimize side effects.  Postnasal Drip She has persistent postnasal drip, with unsuccessful over-the-counter treatments. Singulair, a prescription-strength allergy medication, is considered as it is covered by insurance and is a  tier one medication. - Prescribe Singulair to be taken at bedtime.  General Health  Maintenance Advised to monitor blood pressure at home, reduce salt intake, and maintain hydration. Vitamin D levels will be rechecked due to seasonal changes. Iron levels are excellent, and she is on a vitamin D regimen. - Check blood pressure at home regularly. - Recheck vitamin D levels. - Encourage reduced salt intake and adequate hydration.  Follow-up - Schedule follow-up appointment in 3 months. - Recheck blood pressure at the follow-up appointment.   There are no Patient Instructions on file for this visit.   This note has been created using automated tools and reviewed for accuracy by provider.  Patient received an After Visit Summary    Attestation Statement:   I personally performed the service, non-incident to. (WP)   GLENDA MACARIO HADDOCK, NP

## 2023-08-10 ENCOUNTER — Ambulatory Visit: Payer: Medicare Other | Admitting: Podiatry

## 2023-08-25 NOTE — Progress Notes (Signed)
 [facility] Chief Complaint:   Chief Complaint  Patient presents with  . Left Knee - Pain    History of Present Illness:    Felicia Little is a 78 y.o. female that presents to clinic today for follow up evaluation and management of chronic left knee pain suspected to be due to arthritis.  They were last evaluated by Gustavo Level, PA on 11/21/2022.  At that time, the plan was to perform an intra-articular knee joint steroid injection.  She has done well from this but has recurrence of symptoms so she comes in for reevaluation.  She is accompanied by her daughter.  She lives with her youngest daughter.  At the time of this visit, I reviewed her most recent labs from 06/27/2023 which show creatinine 0.9, normal electrolytes, normal liver function, albumin 4.2, normal vitamin D, normal CBC, A1c 5.4.  Today, the patient reports their symptoms are exacerbated around her left knee diffusely.  Her symptoms are intermittent and dull, aching.  They are aggravated by walking.  She also has some pain at night.  She currently rates pain severity as a 5/10.  She reports associated instability, weakness, limping, pain at night.  She denies associated locking/catching, swelling, numbness or tingling, fevers or chills, night sweats, weight loss, skin color change.  She has been using ice/heat, topical diclofenac/pain cream/pain patch, ibuprofen , acetaminophen , pregabalin , ambulatory aid with ongoing symptoms.  She is right-hand dominant and retired.  She feels like her symptoms significantly interfere with her function.  Of note, she has a history of a stroke with some left-sided weakness.  Medications, Past Medical/Surgical/Family/Social History:   Current Outpatient Medications  Medication Sig Dispense Refill  . acetaminophen  (TYLENOL ) 500 MG tablet Take 500 mg by mouth every 8 (eight) hours as needed for Pain Taking PRN    . amLODIPine  (NORVASC ) 10 MG tablet Take  1 tablet (10 mg total) by mouth once daily 90 tablet 1  . aspirin  81 MG EC tablet Take 81 mg by mouth once daily    . atorvastatin  (LIPITOR) 80 MG tablet Take 1 tablet (80 mg total) by mouth once daily for 90 days 90 tablet 1  . donepeziL  (ARICEPT ) 10 MG tablet Take 1 tablet (10 mg total) by mouth at bedtime 90 tablet 1  . metoprolol SUCCinate (TOPROL-XL) 50 MG XL tablet Take 1 tablet (50 mg total) by mouth once daily 90 tablet 1  . mirtazapine (REMERON) 7.5 MG tablet Take 1 tablet (7.5 mg total) by mouth at bedtime 30 tablet 11  . montelukast (SINGULAIR) 10 mg tablet Take 1 tablet (10 mg total) by mouth at bedtime 90 tablet 1  . multivitamin tablet Take 1 tablet by mouth once daily    . pregabalin  (LYRICA ) 75 MG capsule Take 75 mg in the morning and 150 mg at night (Patient taking differently: Take 75 mg by mouth once daily Take 75 mg in the morning and 150 mg at night) 90 capsule 1  . trospium  (SANCTURA ) 20 mg tablet Take 1 tablet (20 mg total) by mouth at bedtime 30 tablet 0  . cholecalciferol (VITAMIN D3) 2,000 unit tablet Take 1 tablet (2,000 Units total) by mouth once daily 90 tablet 1   No current facility-administered medications for this visit.    SECONDARY CONDITIONS THAT INFLUENCE TREATMENT AND DECISION-MAKING:  Current everyday smoker  Past Medical History: Overweight, hypertension, history of stroke, vascular dementia, hyperlipidemia on statin, anxiety, depression  Past Surgical History: Right  total knee arthroplasty, left total hip arthroplasty, lumbar spine surgery  Relevant Orthopedic Family History: None  Physical Examination:   BP (!) 168/82 Comment: Pt and daughter advised to moniter. Provider verbally notified.  Ht 160 cm (5' 3)   Wt 70.3 kg (155 lb)   BMI 27.46 kg/m  General/Constitutional: Well-nourished, well developed, no apparent distress. Psych: Normal mood and affect.  Conversant.  Judgement intact. Musculoskeletal: Comprehensive Knee Exam: Gait  Antalgic.  Uses wheelchair for ambulation in clinic   Inspection  Left  Skin Normal appearance with no obvious deformity.  No ecchymosis or erythema.  Soft Tissue No focal soft tissue swelling   Palpation   Left  Tenderness + peripatellar, patellar tendon, quad tendon, medial/lateral joint line pain  Crepitus + minimal crepitus  Effusion None   Range of Motion  Left  Flexion  Limited to 120 degrees  Extension  Full knee extension without hyperextension   Strength   Left  Quadriceps Strength 5/5  Hamstring Strength 5/5   Tests Performed/Ordered:   None  Tests Previously Reviewed:  EXAM: Left Knee Radiographs - 3 views (Bilateral AP, Lateral, Bilateral Sunrise) performed 11/21/2022   FINDINGS:   Moderate tricompartmental arthritis change with joint space narrowing, osteophyte formation, subchondral sclerosis.  Chondrocalcinosis is also present.  I personally reviewed and visualized the imaging studies if available.   Assessment:     ICD-10-CM  1. Chronic pain of left knee  M25.562   G89.29  2. Primary osteoarthritis of left knee  M17.12  3. Effusion of left knee  M25.462  4. Chondrocalcinosis  M11.20    Plan:   I have discussed the nature of her current subjective complaints, clinical examination, test results and have reviewed treatment options.  The plan is to do the following;  - The patient has acute exacerbation of chronic left knee pain suspected to be due to her underlying arthritis. - She has some decreased motion with crepitus.  No large effusion.  Tenderness to palpation around the knee.  Previous x-ray shows moderate tricompartmental arthritis change.  I discussed the possible causes of her symptoms with suspicion for pain from arthritis.  I explained the conservative treatment with activity modification, home exercise, physical therapy, ambulatory aid, bracing, medication, topical pain cream, steroid injection, viscosupplementation versus total knee  arthroplasty.  She does not wish to consider surgery.  She has been using some over-the-counter treatments with ongoing symptoms so we decided to perform a steroid injection.  See procedure note for full details. - Activity as tolerated. Modify as needed according to symptoms. No limitations to weight bearing.  She can use an over-the-counter knee brace as needed for comfort.  She can use a cane or walker as needed for ambulatory assistance. - Continue home exercise program to maintain strength, flexibility, and endurance. - Use chronic medication, Tylenol , anti-inflammatories, topical diclofenac/pain cream, relative rest, compression, massage, and ice/heat as needed for pain.   - Follow up as needed in 3-4 weeks depending on response to injection.  LEFT ultrasound guided intra-articular knee joint injection   Consent After discussing the various treatment options for the condition,  It was agreed that a corticosteroid injection would be the next step in treatment.  The nature of and the indications for a corticosteroid and / or local anaesthetic injection were reviewed in detail with the patient today.  The inherent risks of injection including infection, allergic reaction, increased pain, incomplete relief or temporary relief of symptoms, alterations of blood glucose levels requiring careful  monitoring and treatment as indicated, tendon, ligament or articular cartilage rupture or degeneration, nerve injury, skin depigmentation, and/or fatty atrophy were discussed.    Indication for ultrasound Therapeutic injection in which therapeutic benefit is predicated on accurate placement   Procedure After the risks and benefits of the procedure were explained, consent was given, and time-out was performed.  The left knee joint and surrounding structures were visualized with ultrasound.  There was no effusion.  The site for the injection was properly marked and prepped with Chlorhexadine/Isopropyl alcohol  solution.      The injection site was anesthetized with ethyl chloride. Using ultrasound guidance, the knee joint was visualized and injected with 6 milligrams of Betamethasone, 4 milliliters of 0.25% Bupivacaine , and 4 milliliters of 1% Lidocaine  using a sterile technique and a 22 gauge 1.5 inch needle. During injection, there was unrestricted flow and care was taken not to inject corticosteroid into the skin or subcutaneous tissues.    A sterile band-aide was applied.  Post-injection instructions were given regarding post-procedure care, when to follow up in clinic and what to expect from the procedure.  The patient tolerated the injection well and was discharged without complication.     Contact our office with any questions or concerns.  Follow up as indicated, or sooner should any new problems arise, if conditions worsen, or if they are otherwise concerned.    Prentice Reges, DO [facility] [address] [place], [place] [address] Phone: [phone]   This note was generated in part with voice recognition software and I apologize for any typographical errors that were not detected and corrected.  Large Joint Injection: L knee  Date/Time: 08/25/2023 2:00 PM  Performed by: Reges Prentice, DO Authorized by: Reges Prentice, DO   Procedure discussed: discussed risks, benefits, and alternatives   Consent Given by:  Patient Timeout: timeout called immediately prior to procedure   Prep: patient was prepped and draped in usual sterile fashion   Indications:  Pain Needle Size:  22 G Guidance: ultrasound   Location:  Knee Site:  L knee Topical skin anesthesia: obtained using ethyl chloride spray   Medications:  4 mL lidocaine  1 %; 6 mg betamethasone acetate-betamethasone sodium phosphate  6 mg/mL; 4 mL BUPivacaine  HCl 0.25 %

## 2023-11-25 ENCOUNTER — Encounter: Payer: Self-pay | Admitting: Family Medicine

## 2023-11-25 ENCOUNTER — Emergency Department

## 2023-11-25 ENCOUNTER — Encounter: Payer: Self-pay | Admitting: Emergency Medicine

## 2023-11-25 ENCOUNTER — Other Ambulatory Visit: Payer: Self-pay

## 2023-11-25 ENCOUNTER — Observation Stay
Admission: EM | Admit: 2023-11-25 | Discharge: 2023-11-25 | Disposition: A | Discharge status: Home / Self Care | Attending: Internal Medicine | Admitting: Internal Medicine

## 2023-11-25 DIAGNOSIS — I7411 Embolism and thrombosis of thoracic aorta: Secondary | ICD-10-CM | POA: Diagnosis not present

## 2023-11-25 DIAGNOSIS — R079 Chest pain, unspecified: Secondary | ICD-10-CM | POA: Diagnosis present

## 2023-11-25 DIAGNOSIS — I2089 Other forms of angina pectoris: Secondary | ICD-10-CM | POA: Diagnosis not present

## 2023-11-25 DIAGNOSIS — I129 Hypertensive chronic kidney disease with stage 1 through stage 4 chronic kidney disease, or unspecified chronic kidney disease: Secondary | ICD-10-CM | POA: Diagnosis not present

## 2023-11-25 DIAGNOSIS — Z8673 Personal history of transient ischemic attack (TIA), and cerebral infarction without residual deficits: Secondary | ICD-10-CM | POA: Insufficient documentation

## 2023-11-25 DIAGNOSIS — I169 Hypertensive crisis, unspecified: Secondary | ICD-10-CM

## 2023-11-25 DIAGNOSIS — Z7982 Long term (current) use of aspirin: Secondary | ICD-10-CM | POA: Insufficient documentation

## 2023-11-25 DIAGNOSIS — F0150 Vascular dementia without behavioral disturbance: Secondary | ICD-10-CM | POA: Insufficient documentation

## 2023-11-25 DIAGNOSIS — N1831 Chronic kidney disease, stage 3a: Secondary | ICD-10-CM | POA: Diagnosis not present

## 2023-11-25 DIAGNOSIS — I209 Angina pectoris, unspecified: Principal | ICD-10-CM | POA: Insufficient documentation

## 2023-11-25 DIAGNOSIS — E876 Hypokalemia: Secondary | ICD-10-CM | POA: Diagnosis not present

## 2023-11-25 DIAGNOSIS — R0789 Other chest pain: Secondary | ICD-10-CM

## 2023-11-25 DIAGNOSIS — E1122 Type 2 diabetes mellitus with diabetic chronic kidney disease: Secondary | ICD-10-CM | POA: Diagnosis not present

## 2023-11-25 DIAGNOSIS — R110 Nausea: Principal | ICD-10-CM

## 2023-11-25 DIAGNOSIS — I7410 Embolism and thrombosis of unspecified parts of aorta: Secondary | ICD-10-CM

## 2023-11-25 DIAGNOSIS — I10 Essential (primary) hypertension: Secondary | ICD-10-CM | POA: Diagnosis present

## 2023-11-25 LAB — CBC WITH DIFFERENTIAL/PLATELET
Abs Immature Granulocytes: 0.06 K/uL (ref 0.00–0.07)
Basophils Absolute: 0 K/uL (ref 0.0–0.1)
Basophils Relative: 0 %
Eosinophils Absolute: 0 K/uL (ref 0.0–0.5)
Eosinophils Relative: 0 %
HCT: 39.7 % (ref 36.0–46.0)
Hemoglobin: 12.9 g/dL (ref 12.0–15.0)
Immature Granulocytes: 1 %
Lymphocytes Relative: 8 %
Lymphs Abs: 0.8 K/uL (ref 0.7–4.0)
MCH: 29.6 pg (ref 26.0–34.0)
MCHC: 32.5 g/dL (ref 30.0–36.0)
MCV: 91.1 fL (ref 80.0–100.0)
Monocytes Absolute: 0.3 K/uL (ref 0.1–1.0)
Monocytes Relative: 3 %
Neutro Abs: 9 K/uL — ABNORMAL HIGH (ref 1.7–7.7)
Neutrophils Relative %: 88 %
Platelets: 217 K/uL (ref 150–400)
RBC: 4.36 MIL/uL (ref 3.87–5.11)
RDW: 12.7 % (ref 11.5–15.5)
WBC: 10.2 K/uL (ref 4.0–10.5)
nRBC: 0 % (ref 0.0–0.2)

## 2023-11-25 LAB — HEMOGLOBIN A1C
Hgb A1c MFr Bld: 5.2 % (ref 4.8–5.6)
Mean Plasma Glucose: 102.54 mg/dL

## 2023-11-25 LAB — COMPREHENSIVE METABOLIC PANEL WITH GFR
ALT: 10 U/L (ref 0–44)
AST: 21 U/L (ref 15–41)
Albumin: 4 g/dL (ref 3.5–5.0)
Alkaline Phosphatase: 64 U/L (ref 38–126)
Anion gap: 12 (ref 5–15)
BUN: 16 mg/dL (ref 8–23)
CO2: 25 mmol/L (ref 22–32)
Calcium: 9.7 mg/dL (ref 8.9–10.3)
Chloride: 101 mmol/L (ref 98–111)
Creatinine, Ser: 1.1 mg/dL — ABNORMAL HIGH (ref 0.44–1.00)
GFR, Estimated: 51 mL/min — ABNORMAL LOW (ref >60)
Glucose, Bld: 232 mg/dL — ABNORMAL HIGH (ref 70–99)
Potassium: 3.3 mmol/L — ABNORMAL LOW (ref 3.5–5.1)
Sodium: 138 mmol/L (ref 135–145)
Total Bilirubin: 0.7 mg/dL (ref 0.0–1.2)
Total Protein: 8.2 g/dL — ABNORMAL HIGH (ref 6.5–8.1)

## 2023-11-25 LAB — D-DIMER, QUANTITATIVE: D-Dimer, Quant: 0.99 ug{FEU}/mL — ABNORMAL HIGH (ref 0.00–0.50)

## 2023-11-25 LAB — LIPASE, BLOOD: Lipase: 31 U/L (ref 11–51)

## 2023-11-25 LAB — TROPONIN I (HIGH SENSITIVITY)
Troponin I (High Sensitivity): 9 ng/L (ref <18)
Troponin I (High Sensitivity): 11 ng/L (ref <18)

## 2023-11-25 MED ORDER — LIDOCAINE 5 % EX PTCH: 1.0000 | MEDICATED_PATCH | CUTANEOUS | Status: DC

## 2023-11-25 MED ORDER — ENOXAPARIN SODIUM 40 MG/0.4ML IJ SOSY: 40.0000 mg | PREFILLED_SYRINGE | INTRAMUSCULAR | Status: DC

## 2023-11-25 MED ORDER — LISINOPRIL 10 MG PO TABS: 20.0000 mg | ORAL_TABLET | Freq: Every day | ORAL | Status: DC

## 2023-11-25 MED ORDER — DONEPEZIL HCL 5 MG PO TABS
10.0000 mg | ORAL_TABLET | Freq: Every day | ORAL | Status: DC
Start: 2023-11-25 — End: 2023-11-25

## 2023-11-25 MED ORDER — CLOPIDOGREL BISULFATE 75 MG PO TABS: 75.0000 mg | ORAL_TABLET | Freq: Every day | ORAL | Status: DC

## 2023-11-25 MED ORDER — HYDROCHLOROTHIAZIDE 12.5 MG PO TABS: 12.5000 mg | ORAL_TABLET | Freq: Every day | ORAL | Status: DC

## 2023-11-25 MED ORDER — AMLODIPINE BESYLATE 5 MG PO TABS: 5.0000 mg | ORAL_TABLET | Freq: Every day | ORAL | Status: DC

## 2023-11-25 MED ORDER — IOHEXOL 350 MG/ML SOLN
100.0000 mL | Freq: Once | INTRAVENOUS | Status: AC | PRN
  Administered 2023-11-25: 100 mL via INTRAVENOUS

## 2023-11-25 MED ORDER — ONDANSETRON HCL 4 MG PO TABS: 4.0000 mg | ORAL_TABLET | Freq: Four times a day (QID) | ORAL | Status: DC | PRN

## 2023-11-25 MED ORDER — BISACODYL 5 MG PO TBEC: 5.0000 mg | DELAYED_RELEASE_TABLET | Freq: Every day | ORAL | Status: DC | PRN

## 2023-11-25 MED ORDER — HYDROCODONE-ACETAMINOPHEN 5-325 MG PO TABS: 1.0000 | ORAL_TABLET | Freq: Four times a day (QID) | ORAL | Status: DC | PRN

## 2023-11-25 MED ORDER — ASPIRIN 81 MG PO CHEW: 81.0000 mg | CHEWABLE_TABLET | Freq: Two times a day (BID) | ORAL | Status: DC

## 2023-11-25 MED ORDER — PREDNISONE 10 MG PO TABS: 5.0000 mg | ORAL_TABLET | Freq: Every day | ORAL | Status: DC

## 2023-11-25 MED ORDER — LABETALOL HCL 5 MG/ML IV SOLN
15.0000 mg | Freq: Once | INTRAVENOUS | Status: AC
  Administered 2023-11-25: 15 mg via INTRAVENOUS
  Filled 2023-11-25: qty 4

## 2023-11-25 MED ORDER — AMLODIPINE BESYLATE 2.5 MG PO TABS: 10.0000 mg | ORAL_TABLET | Freq: Every day | ORAL | 1 refills | Status: DC

## 2023-11-25 MED ORDER — SERTRALINE HCL 50 MG PO TABS: 50.0000 mg | ORAL_TABLET | Freq: Every day | ORAL | Status: DC

## 2023-11-25 MED ORDER — LABETALOL HCL 100 MG PO TABS
100.0000 mg | ORAL_TABLET | Freq: Once | ORAL | Status: AC
  Administered 2023-11-25: 100 mg via ORAL
  Filled 2023-11-25 (×2): qty 1

## 2023-11-25 MED ORDER — AMLODIPINE BESYLATE 2.5 MG PO TABS: 10.0000 mg | ORAL_TABLET | Freq: Every day | ORAL | 1 refills | Status: AC

## 2023-11-25 MED ORDER — ACETAMINOPHEN 325 MG PO TABS: 650.0000 mg | ORAL_TABLET | Freq: Four times a day (QID) | ORAL | Status: DC | PRN

## 2023-11-25 MED ORDER — PRAVASTATIN SODIUM 20 MG PO TABS: 20.0000 mg | ORAL_TABLET | Freq: Every day | ORAL | Status: DC

## 2023-11-25 MED ORDER — ONDANSETRON HCL 4 MG/2ML IJ SOLN
4.0000 mg | Freq: Once | INTRAMUSCULAR | Status: AC
  Administered 2023-11-25: 4 mg via INTRAVENOUS
  Filled 2023-11-25: qty 2

## 2023-11-25 MED ORDER — GABAPENTIN 300 MG PO CAPS: 600.0000 mg | ORAL_CAPSULE | Freq: Every day | ORAL | Status: DC

## 2023-11-25 MED ORDER — CARVEDILOL 25 MG PO TABS: 25.0000 mg | ORAL_TABLET | Freq: Two times a day (BID) | ORAL | Status: DC

## 2023-11-25 MED ORDER — NORTRIPTYLINE HCL 25 MG PO CAPS: 75.0000 mg | ORAL_CAPSULE | Freq: Every day | ORAL | Status: DC

## 2023-11-25 MED ORDER — NICARDIPINE HCL IN NACL 20-0.86 MG/200ML-% IV SOLN: 3.0000 mg/h | INTRAVENOUS | Status: DC

## 2023-11-25 MED ORDER — DULOXETINE HCL 20 MG PO CPEP: 20.0000 mg | ORAL_CAPSULE | Freq: Every day | ORAL | Status: DC

## 2023-11-25 MED ORDER — LABETALOL HCL 5 MG/ML IV SOLN
10.0000 mg | Freq: Once | INTRAVENOUS | Status: DC
  Filled 2023-11-25: qty 4

## 2023-11-25 MED ORDER — SENNOSIDES-DOCUSATE SODIUM 8.6-50 MG PO TABS: 1.0000 | ORAL_TABLET | Freq: Every evening | ORAL | Status: DC | PRN

## 2023-11-25 MED ORDER — CLOPIDOGREL BISULFATE 75 MG PO TABS: 75.0000 mg | ORAL_TABLET | Freq: Every day | ORAL | 1 refills | Status: AC

## 2023-11-25 MED ORDER — PREGABALIN 75 MG PO CAPS
75.0000 mg | ORAL_CAPSULE | Freq: Two times a day (BID) | ORAL | Status: DC
Start: 2023-11-25 — End: 2023-11-25

## 2023-11-25 MED ORDER — MIRABEGRON ER 25 MG PO TB24: 25.0000 mg | ORAL_TABLET | Freq: Every day | ORAL | Status: DC

## 2023-11-25 MED ORDER — INSULIN ASPART 100 UNIT/ML IJ SOLN: 0.0000 [IU] | Freq: Three times a day (TID) | INTRAMUSCULAR | Status: DC

## 2023-11-25 MED ORDER — POTASSIUM CHLORIDE CRYS ER 20 MEQ PO TBCR: 40.0000 meq | EXTENDED_RELEASE_TABLET | Freq: Once | ORAL | Status: DC

## 2023-11-25 MED ORDER — SODIUM CHLORIDE 0.9 % IV BOLUS
1000.0000 mL | Freq: Once | INTRAVENOUS | Status: AC
  Administered 2023-11-25: 1000 mL via INTRAVENOUS

## 2023-11-25 MED ORDER — METHOCARBAMOL 500 MG PO TABS: 500.0000 mg | ORAL_TABLET | Freq: Three times a day (TID) | ORAL | Status: DC

## 2023-11-25 MED ORDER — ONDANSETRON HCL 4 MG/2ML IJ SOLN: 4.0000 mg | Freq: Four times a day (QID) | INTRAMUSCULAR | Status: DC | PRN

## 2023-11-25 MED ORDER — CLOPIDOGREL BISULFATE 75 MG PO TABS: 75.0000 mg | ORAL_TABLET | Freq: Every day | ORAL | 1 refills | Status: DC

## 2023-11-25 NOTE — H&P (Addendum)
 History and Physical    Felicia Little FMW:[id number] DOB: [birth year] DOA: 11/25/2023  PCP: Harvey Gaetana CROME, NP (Confirm with patient/family/NH records and if not entered, this has to be entered at [facility] point of entry) Patient coming from: Home  I have personally briefly reviewed patient's old medical records in [facility]  Chief Complaint: Chest pain  HPI: Felicia Little is a 78 y.o. female with medical history significant of HTN, HLD, stroke on aspirin  Plavix , multiple OA, anxiety/depression, CKD stage IIIa, GERD presented with new onset of chest pain.  Patient has a poorly controlled right shoulder OA and went to see orthopedic surgery yesterday and was started on prednisone  tapers.  She took 6 pills of prednisone  yesterday afternoon and soon she started to feel burning-like chest pains, worsening with lying down, initially she thought  must be the heartburn she took some 2-3 Tums and the burning-like sensation subsided.  However soon she started to feel pressure-like chest pain which is different in the nature of GERD, nonradiating, associated with shortness of breath, worsening with lying down and activity.  This time she also felt nauseous and vomited x 1 of stomach content nonbloody nonbilious.  No abdominal pain.  This time she called EMS.  ED Course: Afebrile, nontachycardic blood pressure 190/90 O2 saturation 100% on room air.  Workup showed chronic ST-T changes on multiple leads.  Troponin negative x 2, CTA dissection study negative for dissection but incidental finding of 1.3 x 0.8 cm focal thrombosis in the posterior aspect of the hiatal segment of thoracic aorta no aneurysm.  Film of CTA was reviewed by vascular surgeon who recommended continue aspirin  and Plavix .  Review of Systems: As per HPI otherwise 14 point review of systems negative.    Past Medical History:  Diagnosis Date   Anxiety    Arthritis    all over (03/21/2017)   Chronic lower back pain    Chronic  prescription benzodiazepine use 09/03/2015   Depression    Headache(784.0)    maybe once/week (04/06/2012) - none recently   High cholesterol    History of blood transfusion 2007   related to my 2nd hip OR (03/21/2017)   History of kidney stones    Hx of smoking 10/22/2014   Hypertension    Pneumonia ~ 1995; 1996   Renal insufficiency 09/03/2015    Past Surgical History:  Procedure Laterality Date   ANTERIOR HIP REVISION Right 01/26/2015   Procedure: REVISION RIGHT TOTAL HIP ARTHROPLASTY ACETABULAR COMPONENTS  ANTERIOR APPROACH;  Surgeon: Redell Shoals, MD;  Location: [facility] OR;  Service: Orthopedics;  Laterality: Right;   BACK SURGERY     CARPAL TUNNEL RELEASE Right 1990's   JOINT REPLACEMENT     RT HIP  REPLACEMENT   KNEE ARTHROPLASTY Right 03/20/2017   Procedure: RIGHT TOTAL KNEE ARTHROPLASTY WITH COMPUTER NAVIGATION;  Surgeon: Shoals Redell, MD;  Location: [facility] OR;  Service: Orthopedics;  Laterality: Right;  Needs RNFA   KNEE ARTHROSCOPY Right 1990's   LAPAROSCOPIC CHOLECYSTECTOMY  1990's   LUMBAR WOUND DEBRIDEMENT  04/11/2012   Procedure: LUMBAR WOUND DEBRIDEMENT;  Surgeon: Alm GORMAN Molt, MD;  Location: [facility] NEURO ORS;  Service: Neurosurgery;  Laterality: N/A;  Irrigation and debridement of lumbar wound, Repair of pseudomeningocele not requiring laminectomy   POSTERIOR LUMBAR FUSION  04/06/2012   TOTAL HIP ARTHROPLASTY  2005; 2007   right; left (03/21/2017)   VAGINAL HYSTERECTOMY  1979     reports that she quit smoking about 6 years ago.  Her smoking use included cigarettes. She started smoking about 44 years ago. She has a 19 pack-year smoking history. She has never used smokeless tobacco. She reports that she does not drink alcohol and does not use drugs.  Allergies  Allergen Reactions   Other Swelling    VARIOUS METALS cause internal swelling    Family History  Problem Relation Age of Onset   Heart attack Mother    Alzheimer's disease Mother    Hypertension Daughter     Breast cancer Neg Hx      Prior to Admission medications   Medication Sig Start Date End Date Taking? Authorizing Provider  acetaminophen  (TYLENOL ) 500 MG tablet Take by mouth.    [provider]  amLODipine  (NORVASC ) 2.5 MG tablet Take 2.5 mg by mouth daily. 10/16/19   [provider]  aspirin  81 MG chewable tablet Chew 1 tablet (81 mg total) by mouth 2 (two) times daily. 03/21/17   Swinteck, Redell, MD  atorvastatin  (LIPITOR) 80 MG tablet Take by mouth. 07/24/19   [provider]  carvedilol  (COREG ) 25 MG tablet Take by mouth. 07/24/19   [provider]  Cholecalciferol 50 MCG (2000 UT) CAPS Take 50 mcg by mouth daily.    [provider]  clopidogrel  (PLAVIX ) 75 MG tablet Take by mouth. 07/25/19   [provider]  donepezil  (ARICEPT ) 10 MG tablet Take 1 tablet by mouth at bedtime. 06/13/22   [provider]  gabapentin  (NEURONTIN ) 300 MG capsule Take 2 capsules (600 mg total) by mouth at bedtime. 12/22/17   Clark, Katherine K, NP  HYDROcodone -acetaminophen  (NORCO/VICODIN) 5-325 MG tablet Take 1 tablet by mouth every 6 (six) hours as needed for moderate pain. 02/21/21   Fisher, Devere ORN, PA-C  methocarbamol  (ROBAXIN ) 500 MG tablet Take 1 tablet (500 mg total) by mouth 3 (three) times daily. 02/21/21   Fisher, Devere ORN, PA-C  metoprolol succinate (TOPROL-XL) 50 MG 24 hr tablet Take 50 mg by mouth daily.    [provider]  mirtazapine (REMERON) 7.5 MG tablet Take 7.5 mg by mouth at bedtime.    [provider]  montelukast (SINGULAIR) 10 MG tablet Take 10 mg by mouth at bedtime.    [provider]  Multiple Vitamin (MULTI-VITAMIN) tablet Take 1 tablet by mouth daily.    [provider]  nortriptyline  (PAMELOR ) 75 MG capsule TAKE ONE CAPSULE BY MOUTH ONCE DAILY AT BEDTIME 12/22/17   Clark, Katherine K, NP  oxyCODONE -acetaminophen  (PERCOCET/ROXICET) 5-325 MG tablet     [provider]  pravastatin   (PRAVACHOL ) 20 MG tablet TAKE 1 TABLET BY MOUTH AT BEDTIME 03/19/18   Clark, Katherine K, NP  predniSONE  (DELTASONE ) 5 MG tablet See admin instructions.    [provider]  pregabalin  (LYRICA ) 75 MG capsule Take 75 mg by mouth 2 (two) times daily.    [provider]  sertraline  (ZOLOFT ) 50 MG tablet Take 1 tablet by mouth daily. 08/22/20   [provider]  terbinafine  (LAMISIL ) 250 MG tablet Take 1 tablet (250 mg total) by mouth daily. 07/07/21   Tobie Franky SQUIBB, DPM  trospium  (SANCTURA ) 20 MG tablet Take 20 mg by mouth at bedtime. 08/24/23 08/23/24  [provider]  Vibegron  (GEMTESA ) 75 MG TABS Take 1 tablet (75 mg total) by mouth daily. 09/16/22   Maurine Lukes, PA-C    Physical Exam: Vitals:   11/25/23 0515 11/25/23 0530 11/25/23 0545 11/25/23 0630  BP: (!) 188/85 (!) 169/129 (!) 139/100 (!) 164/147  Pulse: 97 91 78 85  Resp: (!) 26 (!) 26 (!) 27 (!) 24  Temp:      TempSrc:      SpO2: 97% 99% 99% 99%  Weight:      Height:        Constitutional: NAD, calm, comfortable Vitals:   11/25/23 0515 11/25/23 0530 11/25/23 0545 11/25/23 0630  BP: (!) 188/85 (!) 169/129 (!) 139/100 (!) 164/147  Pulse: 97 91 78 85  Resp: (!) 26 (!) 26 (!) 27 (!) 24  Temp:      TempSrc:      SpO2: 97% 99% 99% 99%  Weight:      Height:       Eyes: PERRL, lids and conjunctivae normal ENMT: Mucous membranes are moist. Posterior pharynx clear of any exudate or lesions.Normal dentition.  Neck: normal, supple, no masses, no thyromegaly Respiratory: clear to auscultation bilaterally, no wheezing, no crackles. Normal respiratory effort. No accessory muscle use.  Cardiovascular: Regular rate and rhythm, no murmurs / rubs / gallops. No extremity edema. 2+ pedal pulses. No carotid bruits.  Abdomen: no tenderness, no masses palpated. No hepatosplenomegaly. Bowel sounds positive.  Musculoskeletal: no clubbing / cyanosis. No joint deformity upper and lower extremities. Good ROM,  no contractures. Normal muscle tone.  Skin: no rashes, lesions, ulcers. No induration Neurologic: CN 2-12 grossly intact. Sensation intact, DTR normal. Strength 5/5 in all 4.  Psychiatric: Normal judgment and insight. Alert and oriented x 3. Normal mood.     Labs on Admission: I have personally reviewed following labs and imaging studies  CBC: Recent Labs  Lab 11/25/23 0315  WBC 10.2  NEUTROABS 9.0*  HGB 12.9  HCT 39.7  MCV 91.1  PLT 217   Basic Metabolic Panel: Recent Labs  Lab 11/25/23 0055  NA 138  K 3.3*  CL 101  CO2 25  GLUCOSE 232*  BUN 16  CREATININE 1.10*  CALCIUM  9.7   GFR: Estimated Creatinine Clearance: 41.1 mL/min (A) (by C-G formula based on SCr of 1.1 mg/dL (H)). Liver Function Tests: Recent Labs  Lab 11/25/23 0055  AST 21  ALT 10  ALKPHOS 64  BILITOT 0.7  PROT 8.2*  ALBUMIN 4.0   Recent Labs  Lab 11/25/23 0055  LIPASE 31   No results for input(s): AMMONIA in the last 168 hours. Coagulation Profile: No results for input(s): INR, PROTIME in the last 168 hours. Cardiac Enzymes: No results for input(s): CKTOTAL, CKMB, CKMBINDEX, TROPONINI in the last 168 hours. BNP (last 3 results) No results for input(s): PROBNP in the last 8760 hours. HbA1C: No results for input(s): HGBA1C in the last 72 hours. CBG: No results for input(s): GLUCAP in the last 168 hours. Lipid Profile: No results for input(s): CHOL, HDL, LDLCALC, TRIG, CHOLHDL, LDLDIRECT in the last 72 hours. Thyroid  Function Tests: No results for input(s): TSH, T4TOTAL, FREET4, T3FREE, THYROIDAB in the last 72 hours. Anemia Panel: No results for input(s): VITAMINB12, FOLATE, FERRITIN, TIBC, IRON, RETICCTPCT in the last 72 hours. Urine analysis:    Component Value Date/Time   COLORURINE YELLOW (A) 12/28/2022 0021   APPEARANCEUR CLOUDY (A) 12/28/2022 0021   APPEARANCEUR Cloudy (A) 02/11/2020 1343   LABSPEC >1.046 (H) 12/28/2022  0021   PHURINE 6.0 12/28/2022 0021   GLUCOSEU NEGATIVE 12/28/2022 0021   HGBUR MODERATE (A) 12/28/2022 0021   BILIRUBINUR NEGATIVE 12/28/2022 0021   BILIRUBINUR Negative 02/11/2020 1343   KETONESUR NEGATIVE 12/28/2022 0021   PROTEINUR 30 (A) 12/28/2022 0021   UROBILINOGEN 0.2  01/15/2015 1509   NITRITE POSITIVE (A) 12/28/2022 0021   LEUKOCYTESUR LARGE (A) 12/28/2022 0021    Radiological Exams on Admission: CT Angio Chest/Abd/Pel for Dissection W and/or Wo Contrast Result Date: 11/25/2023 CLINICAL DATA:  Chest pain, malaise, vomiting and diarrhea. Evaluating for acute aortic syndrome. EXAM: CT ANGIOGRAPHY CHEST, ABDOMEN AND PELVIS TECHNIQUE: Noncontrast CT of the chest was initially obtained. Multidetector CT imaging through the chest, abdomen and pelvis was performed using the standard protocol during bolus administration of intravenous contrast. Multiplanar reconstructed images and MIPs were obtained and reviewed to evaluate the vascular anatomy. RADIATION DOSE REDUCTION: This exam was performed according to the departmental dose-optimization program which includes automated exposure control, adjustment of the mA and/or kV according to patient size and/or use of iterative reconstruction technique. CONTRAST:  OMNIPAQUE  IOHEXOL  350 MG/ML SOLN COMPARISON:  Chest CT with IV contrast 05/22/2006, CT abdomen and pelvis with contrast 02/25/2020 and 12/27/2022. FINDINGS: CTA CHEST FINDINGS Cardiovascular: There is mild cardiomegaly. There are left main and three-vessel coronary artery calcifications greatest in the LAD and right coronary artery. No pericardial effusion. There is preferential opacification of the pulmonary arteries. No arterial dilatation or embolus is seen. There are moderate patchy calcifications of the thoracic aorta, mixed plaques in the descending segment and a 1.3 x 0.8 cm focal wall thrombus in the posterior aspect of the hiatal segment. The aorta and great vessels are tortuous  without evidence of aneurysm, stenosis or dissection with mild scattered calcification in the great vessels. There is prominence of the superior pulmonary veins. Mediastinum/Nodes: Patulous esophagus without wall thickening. Small hiatal hernia. Normal caliber trachea. Negative thyroid  gland, axillary spaces. no enlarged intrathoracic lymph nodes. Lungs/Pleura: Respiratory motion limits fine detail. There is diffuse bronchial thickening. Mild posterior atelectasis in the lung bases. There is a 3 mm upper lobe nodule laterally on 7:31. No other nodules are seen. There is no active infiltrate, pleural effusion or pneumothorax. Musculoskeletal: Osteopenia. There is thoracic kyphosis with degenerative changes, multilevel bridging enthesopathy and multilevel interbody thoracic spine ankylosis. No acute or other significant osseous abnormality. Unremarkable chest wall. Review of the MIP images confirms the above findings. CTA ABDOMEN AND PELVIS FINDINGS VASCULAR Aorta: Normal caliber with moderate to heavy fibrocalcific plaque. No dissection or stenosis. Celiac: There are ostial calcific plaques causing 50% vessel origin stenosis. There is patchy calcification in the splenic artery. No branch occlusion is seen. Rest of the celiac artery opacifies well. SMA: There are ostial calcific plaques with 40% vessel origin stenosis. The remainder opacifies well. Renals: Both are single. There are nonstenosing calcifications in the proximal right renal artery. No branch occlusion. On the left, there are calcifications just proximal to the bifurcation causing approximately 60-70% stenosis focally just before its bifurcation. No branch occlusion is seen or other focal stenosis. IMA: Patent without evidence of aneurysm, dissection, vasculitis or significant stenosis. Inflow: There are moderate calcific plaques. Bilaterally there are 75% calcific origin stenoses in the internal iliac arteries and irregular up to 60% stenosis in the  remainder. There is no significant stenosis in the common iliac and external iliac arteries. Veins: No obvious venous abnormality within the limitations of this arterial phase study. Review of the MIP images confirms the above findings. NON-VASCULAR Hepatobiliary: Limited fine detail due to breathing motion, due to the patient's arms in the field and owing to overlying metal wires. No obvious mass. Surgical absence of gallbladder. No biliary dilatation. Pancreas: No abnormality is seen through the motion and other artifacts. Spleen: No abnormality  is seen through the motion and other artifacts. Adrenals/Urinary Tract: 3.3 cm Bosniak 1 cyst lower pole right kidney, 18 Hounsfield units. No follow-up imaging required. There is no adrenal or renal mass enhancement visible through the considerable artifacts. There is no urinary stone or obstruction. The bladder and terminal ureters are completely obscured by metal artifact from bilateral hip replacements. Stomach/Bowel: Negative for dilatation or wall thickening. There is advanced diverticulosis in the distal descending and sigmoid colon but no findings of acute diverticulitis. Lymphatic: Negative for adenopathy. Reproductive: Status post hysterectomy. No adnexal masses. Other: None. Musculoskeletal: Degenerative and postsurgical change lumbar spine. Osteopenia. Bilateral hip replacements. L4-5 dorsal fusion hardware and laminectomy. No acute or other significant osseous findings. Review of the MIP images confirms the above findings. IMPRESSION: 1. Aortic and coronary artery atherosclerosis. 2. 1.3 x 0.8 cm focal wall thrombus in the posterior aspect of the hiatal segment of the thoracic aorta. No aortic aneurysm, dissection or stenosis. 3. Cardiomegaly with prominence of the superior pulmonary veins. No pulmonary edema or pleural effusion. 4. 50% celiac artery origin stenosis, 40% SMA origin stenosis, and 60-70% left renal artery stenosis. 5. 75% calcific origin  stenoses in the internal iliac arteries bilaterally. 6. 3 mm right upper lobe nodule. No follow-up needed if patient is low-risk.This recommendation follows the consensus statement: Guidelines for Management of Incidental Pulmonary Nodules Detected on CT Images: From the Fleischner Society 2017; Radiology 2017; 284:228-243. 7. Diverticulosis without evidence of diverticulitis. 8. Osteopenia and degenerative change. 9. Limited evaluation of the abdominal viscera due to multisource artifacts. Aortic Atherosclerosis (ICD10-I70.0). Electronically Signed   By: Francis Quam M.D.   On: 11/25/2023 05:38   DG Chest 1 View Result Date: 11/25/2023 CLINICAL DATA:  Chest pain. EXAM: CHEST  1 VIEW COMPARISON:  November 07, 2022 FINDINGS: The heart size and mediastinal contours are within normal limits. There is marked severity calcification of the aortic arch. No acute infiltrate, pleural effusion or pneumothorax is identified. Multilevel degenerative changes are seen throughout the thoracic spine. IMPRESSION: No active cardiopulmonary disease. Electronically Signed   By: Suzen Dials M.D.   On: 11/25/2023 01:29    EKG: Independently reviewed.  Chronic ST-T changes on multiple leads, sinus  Assessment/Plan Principal Problem:   Chest pain Active Problems:   HTN (hypertension), malignant  (please populate well all problems here in Problem List. (For example, if patient is on BP meds at home and you resume or decide to hold them, it is a problem that needs to be her. Same for CAD, COPD, HLD and so on)  Chest pain, anginal-like, resolved Hypertension emergency - Hypertension emergency with acute endorgan damage of angina like chest pain, which is resolved this morning after BP getting more controlled. - Came off Cardene  drip - Resume home BP meds including Coreg  and amlodipine  - HTN emergency likely elicited by steroid, patient insisted that she does not want to go back to steroid in any case.  I offered  lidocaine  patch and she agreed. - Workup so far for chest pain has been benign, troponin negative x 2 EKG shows some chronic ST segment changes on lead V4 and V5 and aVR without reciprocal changes.  Plan inpatient versus outpatient echocardiogram and possible stress test.  Incidental thoracic focal wall thrombosis - CTA results reviewed by vascular surgeon, will consider the findings are small and unlikely to cause her chest pain symptoms and recommended continue aspirin  Plavix  and statin therapy.  Discussed with patient and she agreed with the plan.  Hypokalemia - P.o. replacement, recheck level tomorrow.  HTN - Resume Coreg  and amlodipine   Elevated glucose - No history of diabetes, etiology likely secondary to steroid. - Discontinue steroids as per patient's wish  History of stroke - Continue aspirin  Plavix  and statin  Vascular dementia - Mentation at baseline - Continue Aricept   CKD stage IIIa - Euvolemic and creatinine level stable - No indication for diuresis  Total time spent on patient care 75 minutes.  DVT prophylaxis: Lovenox  Code Status: Full code Family Communication: None at bedside Disposition Plan: Expect less than 2 midnight hospital stay Consults called: None Admission status: PCU observation   Cort ONEIDA Mana MD [facility] Pager [phone]  11/25/2023, 8:04 AM

## 2023-11-25 NOTE — ED Triage Notes (Signed)
 Pt arrived via [facility] from home c/o not feeling well for a couple of hours, vomiting and diarrhea, pt was seen by PCP for shoulder pain and was started on prednisone .    190/92 P 76 98% RA EKG with EMS showed NSR

## 2023-11-25 NOTE — Discharge Summary (Signed)
 Physician Discharge Summary   Patient: Felicia Little MRN: [id number] DOB: 21-Dec-1945  Admit date:     11/25/2023  Discharge date: 11/25/23  Discharge Physician: Cort ONEIDA Mana   PCP: Harvey Gaetana CROME, NP   Recommendations at discharge:   Follow-up with vascular surgeon in 1 to 2 months to discuss about further follow-up instructions for the newly found incidental thoracic aorta thrombosis Check blood pressure 2 times a day for 7 days and keep a record to show to PCP next week  Discharge Diagnoses: Principal Problem:   Chest pain Active Problems:   HTN (hypertension), malignant Incidental finding of thoracic aorta thrombosis  Hospital Course:  Patient has a poorly controlled right shoulder OA and went to see orthopedic surgery yesterday and was started on prednisone  tapers.  She took 6 pills of prednisone  yesterday afternoon and soon she started to feel burning-like chest pains, worsening with lying down, initially she thought  must be the heartburn she took some 2-3 Tums and the burning-like sensation subsided.  However soon she started to feel pressure-like chest pain which is different in the nature of GERD, nonradiating, associated with shortness of breath, worsening with lying down and activity.  This time she also felt nauseous and vomited x 1 of stomach content nonbloody nonbilious.  No abdominal pain.  This time she called EMS.   During the ED stay it was found patient had extremely high blood pressure with SBP> 190, and patient was started on Cardene  drip briefly and blood pressure came down and all her symptoms including chest pain subsided.  During the ED workup there was a CTA dissection study showed negative for large vessel dissection or aneurysm however there was incidental finding of a 1.3 x 0.8 cm focal wall thrombosis in the posterior aspect of higher toe segment of thoracic aorta.  Image was reviewed by vascular surgeon who considered this occlusion is a nonoperable and  recommend patient continue aspirin  Plavix .  I discussed the case with patient's daughter over the phone, appears that the daughter told me that patient used to be on aspirin  and Plavix  with dual regimen in 2023 but has come off of dual antiplatelet therapy last year and only on aspirin  now.  I sent Plavix  prescription to her pharmacy [facility] 90 days x 1 refill and told daughter to take patient for follow-up with vascular surgeon in 1 to 2 months to discuss about further follow-up plan.  Daughter expressed understanding and agreed.      Pain control - [place]  Controlled Substance Reporting System database was reviewed. and patient was instructed, not to drive, operate heavy machinery, perform activities at heights, swimming or participation in water  activities or provide baby-sitting services while on Pain, Sleep and Anxiety Medications; until their outpatient Physician has advised to do so again. Also recommended to not to take more than prescribed Pain, Sleep and Anxiety Medications.  Consultants: Vascular surgery Procedures performed: None Disposition: Home Diet recommendation:  Cardiac diet DISCHARGE MEDICATION: Allergies as of 11/25/2023       Reactions   Other Swelling   VARIOUS METALS cause internal swelling        Medication List     STOP taking these medications    gabapentin  300 MG capsule Commonly known as: NEURONTIN    Gemtesa  75 MG Tabs Generic drug: Vibegron    HYDROcodone -acetaminophen  5-325 MG tablet Commonly known as: NORCO/VICODIN   methocarbamol  500 MG tablet Commonly known as: Robaxin    nortriptyline  75 MG capsule Commonly known as:  PAMELOR    oxyCODONE -acetaminophen  5-325 MG tablet Commonly known as: PERCOCET/ROXICET   pravastatin  20 MG tablet Commonly known as: PRAVACHOL    predniSONE  5 MG tablet Commonly known as: DELTASONE    sertraline  50 MG tablet Commonly known as: ZOLOFT    terbinafine  250 MG tablet Commonly known as: LamISIL         TAKE these medications    acetaminophen  500 MG tablet Commonly known as: TYLENOL  Take 500 mg by mouth every 6 (six) hours as needed for mild pain (pain score 1-3).   amLODipine  2.5 MG tablet Commonly known as: NORVASC  Take 4 tablets (10 mg total) by mouth daily. What changed: how much to take   aspirin  81 MG chewable tablet Chew 1 tablet (81 mg total) by mouth 2 (two) times daily.   atorvastatin  80 MG tablet Commonly known as: LIPITOR Take by mouth.   carvedilol  25 MG tablet Commonly known as: COREG  Take by mouth.   Cholecalciferol 50 MCG (2000 UT) Caps Take 50 mcg by mouth daily.   clopidogrel  75 MG tablet Commonly known as: PLAVIX  Take 1 tablet (75 mg total) by mouth daily. What changed:  how much to take when to take this   donepezil  10 MG tablet Commonly known as: ARICEPT  Take 1 tablet by mouth at bedtime.   ferrous sulfate 325 (65 FE) MG tablet Take 325 mg by mouth daily.   metoprolol succinate 50 MG 24 hr tablet Commonly known as: TOPROL-XL Take 50 mg by mouth daily.   mirtazapine 7.5 MG tablet Commonly known as: REMERON Take 7.5 mg by mouth at bedtime.   montelukast 10 MG tablet Commonly known as: SINGULAIR Take 10 mg by mouth at bedtime.   Multi-Vitamin tablet Take 1 tablet by mouth daily.   pregabalin  75 MG capsule Commonly known as: LYRICA  Take 75 mg by mouth daily.   trospium  20 MG tablet Commonly known as: SANCTURA  Take 20 mg by mouth at bedtime.        Follow-up Information     Esco, Miechia A, MD Follow up in 1 month(s).   Specialty: Vascular Surgery Why: Thoracic aorta thrombosis Contact information: [address] [place] [address] [phone]                Discharge Exam: Filed Weights   11/25/23 0042  Weight: 73.9 kg   Eyes: PERRL, lids and conjunctivae normal ENMT: Mucous membranes are moist. Posterior pharynx clear of any exudate or lesions.Normal dentition.  Neck: normal, supple, no masses, no  thyromegaly Respiratory: clear to auscultation bilaterally, no wheezing, no crackles. Normal respiratory effort. No accessory muscle use.  Cardiovascular: Regular rate and rhythm, no murmurs / rubs / gallops. No extremity edema. 2+ pedal pulses. No carotid bruits.  Abdomen: no tenderness, no masses palpated. No hepatosplenomegaly. Bowel sounds positive.  Musculoskeletal: no clubbing / cyanosis. No joint deformity upper and lower extremities. Good ROM, no contractures. Normal muscle tone.  Skin: no rashes, lesions, ulcers. No induration Neurologic: CN 2-12 grossly intact. Sensation intact, DTR normal.  Muscle strength 5/5 on both sides Psychiatric: Normal judgment and insight. Alert and oriented x 3. Normal mood.     Condition at discharge: good  The results of significant diagnostics from this hospitalization (including imaging, microbiology, ancillary and laboratory) are listed below for reference.   Imaging Studies: CT Angio Chest/Abd/Pel for Dissection W and/or Wo Contrast Result Date: 11/25/2023 CLINICAL DATA:  Chest pain, malaise, vomiting and diarrhea. Evaluating for acute aortic syndrome. EXAM: CT ANGIOGRAPHY CHEST, ABDOMEN AND PELVIS TECHNIQUE:  Noncontrast CT of the chest was initially obtained. Multidetector CT imaging through the chest, abdomen and pelvis was performed using the standard protocol during bolus administration of intravenous contrast. Multiplanar reconstructed images and MIPs were obtained and reviewed to evaluate the vascular anatomy. RADIATION DOSE REDUCTION: This exam was performed according to the departmental dose-optimization program which includes automated exposure control, adjustment of the mA and/or kV according to patient size and/or use of iterative reconstruction technique. CONTRAST:  OMNIPAQUE  IOHEXOL  350 MG/ML SOLN COMPARISON:  Chest CT with IV contrast 05/22/2006, CT abdomen and pelvis with contrast 02/25/2020 and 12/27/2022. FINDINGS: CTA CHEST FINDINGS  Cardiovascular: There is mild cardiomegaly. There are left main and three-vessel coronary artery calcifications greatest in the LAD and right coronary artery. No pericardial effusion. There is preferential opacification of the pulmonary arteries. No arterial dilatation or embolus is seen. There are moderate patchy calcifications of the thoracic aorta, mixed plaques in the descending segment and a 1.3 x 0.8 cm focal wall thrombus in the posterior aspect of the hiatal segment. The aorta and great vessels are tortuous without evidence of aneurysm, stenosis or dissection with mild scattered calcification in the great vessels. There is prominence of the superior pulmonary veins. Mediastinum/Nodes: Patulous esophagus without wall thickening. Small hiatal hernia. Normal caliber trachea. Negative thyroid  gland, axillary spaces. no enlarged intrathoracic lymph nodes. Lungs/Pleura: Respiratory motion limits fine detail. There is diffuse bronchial thickening. Mild posterior atelectasis in the lung bases. There is a 3 mm upper lobe nodule laterally on 7:31. No other nodules are seen. There is no active infiltrate, pleural effusion or pneumothorax. Musculoskeletal: Osteopenia. There is thoracic kyphosis with degenerative changes, multilevel bridging enthesopathy and multilevel interbody thoracic spine ankylosis. No acute or other significant osseous abnormality. Unremarkable chest wall. Review of the MIP images confirms the above findings. CTA ABDOMEN AND PELVIS FINDINGS VASCULAR Aorta: Normal caliber with moderate to heavy fibrocalcific plaque. No dissection or stenosis. Celiac: There are ostial calcific plaques causing 50% vessel origin stenosis. There is patchy calcification in the splenic artery. No branch occlusion is seen. Rest of the celiac artery opacifies well. SMA: There are ostial calcific plaques with 40% vessel origin stenosis. The remainder opacifies well. Renals: Both are single. There are nonstenosing  calcifications in the proximal right renal artery. No branch occlusion. On the left, there are calcifications just proximal to the bifurcation causing approximately 60-70% stenosis focally just before its bifurcation. No branch occlusion is seen or other focal stenosis. IMA: Patent without evidence of aneurysm, dissection, vasculitis or significant stenosis. Inflow: There are moderate calcific plaques. Bilaterally there are 75% calcific origin stenoses in the internal iliac arteries and irregular up to 60% stenosis in the remainder. There is no significant stenosis in the common iliac and external iliac arteries. Veins: No obvious venous abnormality within the limitations of this arterial phase study. Review of the MIP images confirms the above findings. NON-VASCULAR Hepatobiliary: Limited fine detail due to breathing motion, due to the patient's arms in the field and owing to overlying metal wires. No obvious mass. Surgical absence of gallbladder. No biliary dilatation. Pancreas: No abnormality is seen through the motion and other artifacts. Spleen: No abnormality is seen through the motion and other artifacts. Adrenals/Urinary Tract: 3.3 cm Bosniak 1 cyst lower pole right kidney, 18 Hounsfield units. No follow-up imaging required. There is no adrenal or renal mass enhancement visible through the considerable artifacts. There is no urinary stone or obstruction. The bladder and terminal ureters are completely obscured by metal artifact  from bilateral hip replacements. Stomach/Bowel: Negative for dilatation or wall thickening. There is advanced diverticulosis in the distal descending and sigmoid colon but no findings of acute diverticulitis. Lymphatic: Negative for adenopathy. Reproductive: Status post hysterectomy. No adnexal masses. Other: None. Musculoskeletal: Degenerative and postsurgical change lumbar spine. Osteopenia. Bilateral hip replacements. L4-5 dorsal fusion hardware and laminectomy. No acute or other  significant osseous findings. Review of the MIP images confirms the above findings. IMPRESSION: 1. Aortic and coronary artery atherosclerosis. 2. 1.3 x 0.8 cm focal wall thrombus in the posterior aspect of the hiatal segment of the thoracic aorta. No aortic aneurysm, dissection or stenosis. 3. Cardiomegaly with prominence of the superior pulmonary veins. No pulmonary edema or pleural effusion. 4. 50% celiac artery origin stenosis, 40% SMA origin stenosis, and 60-70% left renal artery stenosis. 5. 75% calcific origin stenoses in the internal iliac arteries bilaterally. 6. 3 mm right upper lobe nodule. No follow-up needed if patient is low-risk.This recommendation follows the consensus statement: Guidelines for Management of Incidental Pulmonary Nodules Detected on CT Images: From the Fleischner Society 2017; Radiology 2017; 284:228-243. 7. Diverticulosis without evidence of diverticulitis. 8. Osteopenia and degenerative change. 9. Limited evaluation of the abdominal viscera due to multisource artifacts. Aortic Atherosclerosis (ICD10-I70.0). Electronically Signed   By: Francis Quam M.D.   On: 11/25/2023 05:38   DG Chest 1 View Result Date: 11/25/2023 CLINICAL DATA:  Chest pain. EXAM: CHEST  1 VIEW COMPARISON:  November 07, 2022 FINDINGS: The heart size and mediastinal contours are within normal limits. There is marked severity calcification of the aortic arch. No acute infiltrate, pleural effusion or pneumothorax is identified. Multilevel degenerative changes are seen throughout the thoracic spine. IMPRESSION: No active cardiopulmonary disease. Electronically Signed   By: Suzen Dials M.D.   On: 11/25/2023 01:29    Microbiology: Results for orders placed or performed in visit on 02/12/20  CULTURE, URINE COMPREHENSIVE     Status: Abnormal   Collection Time: 02/12/20  2:00 PM   Specimen: Urine   UR  Result Value Ref Range Status   Urine Culture, Comprehensive Final report (A)  Final   Organism ID,  Bacteria Klebsiella pneumoniae (A)  Final    Comment: Cefazolin  <=4 ug/mL Cefazolin  with an MIC <=16 predicts susceptibility to the oral agents cefaclor, cefdinir, cefpodoxime, cefprozil, cefuroxime, cephalexin , and loracarbef when used for therapy of uncomplicated urinary tract infections due to E. coli, Klebsiella pneumoniae, and Proteus mirabilis. Greater than 100,000 colony forming units per mL    Organism ID, Bacteria Comment (A)  Final    Comment: Escherichia coli, identified by an automated biochemical system. Greater than 100,000 colony forming units per mL Cefazolin  <=4 ug/mL Cefazolin  with an MIC <=16 predicts susceptibility to the oral agents cefaclor, cefdinir, cefpodoxime, cefprozil, cefuroxime, cephalexin , and loracarbef when used for therapy of uncomplicated urinary tract infections due to E. coli, Klebsiella pneumoniae, and Proteus mirabilis.    ANTIMICROBIAL SUSCEPTIBILITY Comment  Final    Comment:       ** S = Susceptible; I = Intermediate; R = Resistant **                    P = Positive; N = Negative             MICS are expressed in micrograms per mL    Antibiotic                 RSLT#1    RSLT#2    RSLT#3  RSLT#4 Amoxicillin/Clavulanic Acid    S         S Ampicillin                     R         S Cefepime                       S         S Ceftriaxone                     S         S Cefuroxime                     S         S Ciprofloxacin                  S         S Ertapenem                      S         S Gentamicin                     S         S Imipenem                       S         S Levofloxacin                   S         S Meropenem                      S         S Nitrofurantoin                  S         S Piperacillin/Tazobactam        S         S Tetracycline                   S         S Tobramycin                     S         S Trimethoprim /Sulfa              S         S     Labs: CBC: Recent Labs  Lab 11/25/23 0315  WBC 10.2   NEUTROABS 9.0*  HGB 12.9  HCT 39.7  MCV 91.1  PLT 217   Basic Metabolic Panel: Recent Labs  Lab 11/25/23 0055  NA 138  K 3.3*  CL 101  CO2 25  GLUCOSE 232*  BUN 16  CREATININE 1.10*  CALCIUM  9.7   Liver Function Tests: Recent Labs  Lab 11/25/23 0055  AST 21  ALT 10  ALKPHOS 64  BILITOT 0.7  PROT 8.2*  ALBUMIN 4.0   CBG: No results for input(s): GLUCAP in the last 168 hours.  Discharge time spent: greater than 30 minutes.  Signed: Cort ONEIDA Mana, MD [facility] 11/25/2023

## 2023-11-25 NOTE — ED Notes (Signed)
 Changed pt brief, gown, and blankets at this time. Pt given warm blankets at this time.

## 2023-11-25 NOTE — ED Provider Notes (Addendum)
 [facility] Provider Note    Event Date/Time   First MD Initiated Contact with Patient 11/25/23 0130     (approximate)   History   Chest Pain, Vomiting, and Diarrhea   HPI  Felicia Little is a 78 y.o. female   Past medical history of chronic back pain, hyperlipidemia, hypertension, who presents to the emergency department with chest pressure and nausea.  This started last evening.  She also has chronic diarrhea unchanged, no GI bleeding.  She notes that she started on a prednisone  course yesterday after seeing her orthopedist for shoulder pain.  Her joint pains are chronic and unchanged and she believes she took prednisone  in the past without issues.  She denies any rash, swelling.  She denies respiratory infectious symptoms like cough or fever.  She reports no abdominal pain.  Prior to arrival she took 4 aspirin  per EMS recommendation.  Independent Historian contributed to assessment above: Her daughters are at bedside to corroborate information past medical history as above  External Medical Documents Reviewed: Orthopedics note from yesterday      Physical Exam   Triage Vital Signs: ED Triage Vitals  Encounter Vitals Group     BP 11/25/23 0040 (!) 155/118     Girls Systolic BP Percentile --      Girls Diastolic BP Percentile --      Boys Systolic BP Percentile --      Boys Diastolic BP Percentile --      Pulse Rate 11/25/23 0040 83     Resp 11/25/23 0040 18     Temp 11/25/23 0040 98.2 F (36.8 C)     Temp Source 11/25/23 0040 Oral     SpO2 11/25/23 0040 97 %     Weight 11/25/23 0042 163 lb (73.9 kg)     Height 11/25/23 0042 5' 5 (1.651 m)     Head Circumference --      Peak Flow --      Pain Score 11/25/23 0040 5     Pain Loc --      Pain Education --      Exclude from Growth Chart --     Most recent vital signs: Vitals:   11/25/23 0530 11/25/23 0545  BP: (!) 169/129 (!) 139/100  Pulse: 91 78  Resp: (!) 26 (!) 27  Temp:     SpO2: 99% 99%    General: Awake, no distress.  CV:  Good peripheral perfusion.  Resp:  Normal effort.  Abd:  No distention.  Other:  Awake alert comfortable appearing with hypertension initial triage vitals 155/118 but on repeat multiple times w  200 systolic, no respiratory distress, breathing comfortably on room air.  Clear lungs to auscultation without focality or wheezing.  Heart sounds normal rate and rhythm.  Soft benign abdominal exam to deep palpation all quadrants.   ED Results / Procedures / Treatments   Labs (all labs ordered are listed, but only abnormal results are displayed) Labs Reviewed  COMPREHENSIVE METABOLIC PANEL WITH GFR - Abnormal; Notable for the following components:      Result Value   Potassium 3.3 (*)    Glucose, Bld 232 (*)    Creatinine, Ser 1.10 (*)    Total Protein 8.2 (*)    GFR, Estimated 51 (*)    All other components within normal limits  D-DIMER, QUANTITATIVE - Abnormal; Notable for the following components:   D-Dimer, Quant 0.99 (*)    All other components within normal  limits  CBC WITH DIFFERENTIAL/PLATELET - Abnormal; Notable for the following components:   Neutro Abs 9.0 (*)    All other components within normal limits  LIPASE, BLOOD  URINALYSIS, ROUTINE W REFLEX MICROSCOPIC  TROPONIN I (HIGH SENSITIVITY)  TROPONIN I (HIGH SENSITIVITY)     I ordered and reviewed the above labs they are notable for hyperglycemia, anion gap normal.  EKG  ED ECG REPORT I, Ginnie Shams, the attending physician, personally viewed and interpreted this ECG.   Date: 11/25/2023  EKG Time: 0047  Rate: 84  Rhythm: sinus  Axis: nl  Intervals:nl  ST&T Change: T wave inversion/ST depressions in inferolateral leads    RADIOLOGY I independently reviewed and interpreted chest x-ray I see no obvious focality pneumothorax I also reviewed radiologist's formal read.   PROCEDURES:  Critical Care performed: Yes, see critical care procedure  note(s)  .Critical Care  Performed by: Shams Ginnie, MD Authorized by: Shams Ginnie, MD   Critical care provider statement:    Critical care time (minutes):  30   Critical care was time spent personally by me on the following activities:  Development of treatment plan with patient or surrogate, discussions with consultants, evaluation of patient's response to treatment, examination of patient, ordering and review of laboratory studies, ordering and review of radiographic studies, ordering and performing treatments and interventions, pulse oximetry, re-evaluation of patient's condition and review of old charts    MEDICATIONS ORDERED IN ED: Medications  labetalol  (NORMODYNE ) tablet 100 mg (has no administration in time range)  nicardipine  (CARDENE ) 20mg  in 0.86% saline IV infusion (0.1 mg/ml) (0 mg/hr Intravenous Hold 11/25/23 0559)  sodium chloride  0.9 % bolus 1,000 mL (0 mLs Intravenous Stopped 11/25/23 0410)  ondansetron  (ZOFRAN ) injection 4 mg (4 mg Intravenous Given 11/25/23 0201)  iohexol  (OMNIPAQUE ) 350 MG/ML injection 100 mL (100 mLs Intravenous Contrast Given 11/25/23 0421)  labetalol  (NORMODYNE ) injection 15 mg (15 mg Intravenous Given 11/25/23 0418)    External physician / consultants:  I spoke with Dr. Tisa vascular surgery regarding care plan for this patient.   IMPRESSION / MDM / ASSESSMENT AND PLAN / ED COURSE  I reviewed the triage vital signs and the nursing notes.                                Patient's presentation is most consistent with acute presentation with potential threat to life or bodily function.  Differential diagnosis includes, but is not limited to, ACS, PE, dissection, adverse effect of prednisone , pill esophagitis, considered but less likely intra-abdominal infection or obstruction   The patient is on the cardiac monitor to evaluate for evidence of arrhythmia and/or significant heart rate changes.  MDM:    Concern for cardiopulmonary  emergencies like PE ACS dissection given chest pressure and nausea acute onset last evening.  Also markedly hypertensive.   EKG with inferolateral ST changes concerning for ischemia.  Will check serial troponins.  Given aspirin  already.  Considered dissection or PE as well, though symptoms and EKG changes more concerning for cardiac ischemia primarily.  Will risk ratified with D-dimer.  D-dimer elevated.  Symptoms of chest pressure, markedly hypertensive, and no shortness of breath, so will obtain CT angiogram dissection protocol instead of PE protocol as this is my higher clinical suspicion and concern.  -- CT angiogram with no evidence of aneurysm or dissection fortunately but does note a focal wall thrombus in the posterior aspect of the  hiatal segment of the thoracic aorta, this finding was discussed with Dr. Gwyndolyn vascular surgery it is highly unlikely to represent her pain today, and does not need further intervention as she is already anticoagulated with aspirin  and Plavix .  However given her chest pressure, hypertension, and EKG changes I think she should be admitted for hypertensive crisis.  She had some very transient improvement in her systolic blood pressure with IV dose of labetalol  15 mg and was chase with oral labetalol  and blood pressure has improved.   FINAL CLINICAL IMPRESSION(S) / ED DIAGNOSES   Final diagnoses:  Nausea  Atypical chest pain  Aortic thrombus (HCC)  Hypertensive crisis     Rx / DC Orders   ED Discharge Orders     None      Note:  This document was prepared using Dragon voice recognition software and may include unintentional dictation errors.    Cyrena Mylar, MD 11/25/23 [phone]    Cyrena Mylar, MD 11/25/23 9294    Cyrena Mylar, MD 11/25/23 [phone]

## 2023-12-22 ENCOUNTER — Other Ambulatory Visit (INDEPENDENT_AMBULATORY_CARE_PROVIDER_SITE_OTHER): Payer: Self-pay | Admitting: Nurse Practitioner

## 2023-12-22 DIAGNOSIS — I700 Atherosclerosis of aorta: Secondary | ICD-10-CM

## 2023-12-25 ENCOUNTER — Ambulatory Visit (INDEPENDENT_AMBULATORY_CARE_PROVIDER_SITE_OTHER)

## 2023-12-25 ENCOUNTER — Ambulatory Visit (INDEPENDENT_AMBULATORY_CARE_PROVIDER_SITE_OTHER): Admitting: Vascular Surgery

## 2023-12-25 ENCOUNTER — Encounter (INDEPENDENT_AMBULATORY_CARE_PROVIDER_SITE_OTHER): Payer: Self-pay | Admitting: Vascular Surgery

## 2023-12-25 VITALS — BP 149/79 | HR 69 | Resp 18

## 2023-12-25 DIAGNOSIS — E785 Hyperlipidemia, unspecified: Secondary | ICD-10-CM

## 2023-12-25 DIAGNOSIS — I700 Atherosclerosis of aorta: Secondary | ICD-10-CM | POA: Diagnosis not present

## 2023-12-25 DIAGNOSIS — I10 Essential (primary) hypertension: Secondary | ICD-10-CM

## 2023-12-25 DIAGNOSIS — M150 Primary generalized (osteo)arthritis: Secondary | ICD-10-CM | POA: Diagnosis not present

## 2023-12-25 DIAGNOSIS — I70213 Atherosclerosis of native arteries of extremities with intermittent claudication, bilateral legs: Secondary | ICD-10-CM

## 2023-12-29 LAB — VAS US ABI WITH/WO TBI
Left ABI: 0.6
Right ABI: 0.7

## 2023-12-30 ENCOUNTER — Encounter (INDEPENDENT_AMBULATORY_CARE_PROVIDER_SITE_OTHER): Payer: Self-pay | Admitting: Vascular Surgery

## 2023-12-30 DIAGNOSIS — M1990 Unspecified osteoarthritis, unspecified site: Secondary | ICD-10-CM | POA: Insufficient documentation

## 2023-12-30 DIAGNOSIS — I70219 Atherosclerosis of native arteries of extremities with intermittent claudication, unspecified extremity: Secondary | ICD-10-CM | POA: Insufficient documentation

## 2023-12-30 DIAGNOSIS — I10 Essential (primary) hypertension: Secondary | ICD-10-CM | POA: Insufficient documentation

## 2023-12-30 NOTE — Progress Notes (Signed)
 MRN : [id number]  Felicia Little is a 78 y.o. (25-Mar-1946) female who presents with chief complaint of check circulation.  History of Present Illness:    The patient is seen for evaluation of painful lower extremities and diminished pulses. Patient notes the pain is always associated with activity and is very consistent day today. Typically, the pain occurs at less than one block, progress is as activity continues to the point that the patient must stop walking. Resting including standing still for several minutes allows the patient to walk a similar distance before being forced to stop again. Uneven terrain and inclines shorten the distance. The pain has been progressive over the past several years. The patient denies any abrupt changes in claudication symptoms.  The patient states she is tolerating the symptoms well.  The patient denies rest pain or dangling of an extremity off the side of the bed during the night for relief. No open wounds or sores at this time. No prior interventions or surgeries.  No history of back problems or DJD of the lumbar sacral spine.   The patient's blood pressure has been stable and relatively well controlled. The patient denies amaurosis fugax or recent TIA symptoms. There are no recent neurological changes noted. The patient denies history of DVT, PE or superficial thrombophlebitis. The patient denies recent episodes of angina or shortness of breath.   Current Meds  Medication Sig   acetaminophen  (TYLENOL ) 500 MG tablet Take 500 mg by mouth every 6 (six) hours as needed for mild pain (pain score 1-3).   amLODipine  (NORVASC ) 2.5 MG tablet Take 4 tablets (10 mg total) by mouth daily.   aspirin  81 MG chewable tablet Chew 1 tablet (81 mg total) by mouth 2 (two) times daily.   atorvastatin  (LIPITOR) 80 MG tablet Take by mouth.   carvedilol  (COREG ) 25 MG tablet Take by mouth.    Cholecalciferol 50 MCG (2000 UT) CAPS Take 50 mcg by mouth daily.   clopidogrel  (PLAVIX ) 75 MG tablet Take 1 tablet (75 mg total) by mouth daily.   donepezil  (ARICEPT ) 10 MG tablet Take 1 tablet by mouth at bedtime.   ferrous sulfate 325 (65 FE) MG tablet Take 325 mg by mouth daily.   metoprolol succinate (TOPROL-XL) 50 MG 24 hr tablet Take 50 mg by mouth daily.   mirtazapine (REMERON) 7.5 MG tablet Take 7.5 mg by mouth at bedtime.   montelukast (SINGULAIR) 10 MG tablet Take 10 mg by mouth at bedtime.   Multiple Vitamin (MULTI-VITAMIN) tablet Take 1 tablet by mouth daily.   pregabalin  (LYRICA ) 75 MG capsule Take 75 mg by mouth daily.   trospium  (SANCTURA ) 20 MG tablet Take 20 mg by mouth at bedtime.    Past Medical History:  Diagnosis Date   Anxiety    Arthritis    all over (03/21/2017)   Chronic lower back pain    Chronic prescription benzodiazepine use 09/03/2015   Depression    Headache(784.0)    maybe once/week (04/06/2012) - none recently   High cholesterol    History of blood transfusion 2007   related  to my 2nd hip OR (03/21/2017)   History of kidney stones    Hx of smoking 10/22/2014   Hypertension    Pneumonia ~ 1995; 1996   Renal insufficiency 09/03/2015    Past Surgical History:  Procedure Laterality Date   ANTERIOR HIP REVISION Right 01/26/2015   Procedure: REVISION RIGHT TOTAL HIP ARTHROPLASTY ACETABULAR COMPONENTS  ANTERIOR APPROACH;  Surgeon: Redell Shoals, MD;  Location: [facility] OR;  Service: Orthopedics;  Laterality: Right;   BACK SURGERY     CARPAL TUNNEL RELEASE Right 1990's   JOINT REPLACEMENT     RT HIP  REPLACEMENT   KNEE ARTHROPLASTY Right 03/20/2017   Procedure: RIGHT TOTAL KNEE ARTHROPLASTY WITH COMPUTER NAVIGATION;  Surgeon: Shoals Redell, MD;  Location: [facility] OR;  Service: Orthopedics;  Laterality: Right;  Needs RNFA   KNEE ARTHROSCOPY Right 1990's   LAPAROSCOPIC CHOLECYSTECTOMY  1990's   LUMBAR WOUND DEBRIDEMENT  04/11/2012   Procedure: LUMBAR WOUND  DEBRIDEMENT;  Surgeon: Alm GORMAN Molt, MD;  Location: [facility];  Service: Neurosurgery;  Laterality: N/A;  Irrigation and debridement of lumbar wound, Repair of pseudomeningocele not requiring laminectomy   POSTERIOR LUMBAR FUSION  04/06/2012   TOTAL HIP ARTHROPLASTY  2005; 2007   right; left (03/21/2017)   VAGINAL HYSTERECTOMY  1979    Social History Social History   Tobacco Use   Smoking status: Every Day    Current packs/day: 0.00    Average packs/day: 0.5 packs/day for 38.0 years (19.0 ttl pk-yrs)    Types: Cigarettes    Start date: 02/02/1979    Last attempt to quit: 02/01/2017    Years since quitting: 6.9   Smokeless tobacco: Never  Vaping Use   Vaping status: Never Used  Substance Use Topics   Alcohol use: No    Alcohol/week: 0.0 standard drinks of alcohol    Comment: 03/21/2017  tried alcohol once; didn't like it; never tried it again   Drug use: No    Family History Family History  Problem Relation Age of Onset   Heart attack Mother    Alzheimer's disease Mother    Hypertension Daughter    Breast cancer Neg Hx     Allergies  Allergen Reactions   Other Swelling    VARIOUS METALS cause internal swelling     REVIEW OF SYSTEMS (Negative unless checked)  Constitutional: [] Weight loss  [] Fever  [] Chills Cardiac: [] Chest pain   [] Chest pressure   [] Palpitations   [] Shortness of breath when laying flat   [] Shortness of breath with exertion. Vascular:  [x] Pain in legs with walking   [] Pain in legs at rest  [] History of DVT   [] Phlebitis   [] Swelling in legs   [] Varicose veins   [] Non-healing ulcers Pulmonary:   [] Uses home oxygen   [] Productive cough   [] Hemoptysis   [] Wheeze  [] COPD   [] Asthma Neurologic:  [] Dizziness   [] Seizures   [] History of stroke   [] History of TIA  [] Aphasia   [] Vissual changes   [] Weakness or numbness in arm   [] Weakness or numbness in leg Musculoskeletal:   [] Joint swelling   [] Joint pain   [] Low back pain Hematologic:  [] Easy  bruising  [] Easy bleeding   [] Hypercoagulable state   [] Anemic Gastrointestinal:  [] Diarrhea   [] Vomiting  [] Gastroesophageal reflux/heartburn   [] Difficulty swallowing. Genitourinary:  [] Chronic kidney disease   [] Difficult urination  [] Frequent urination   [] Blood in urine Skin:  [] Rashes   [] Ulcers  Psychological:  [] History of anxiety   []  History  of major depression.  Physical Examination  Vitals:   12/25/23 1518  BP: (!) 149/79  Pulse: 69  Resp: 18   There is no height or weight on file to calculate BMI. Gen: WD/WN, NAD Head: [place]/AT, No temporalis wasting.  Ear/Nose/Throat: Hearing grossly intact, nares w/o erythema or drainage Eyes: PER, EOMI, sclera nonicteric.  Neck: Supple, no masses.  No bruit or JVD.  Pulmonary:  Good air movement, no audible wheezing, no use of accessory muscles.  Cardiac: RRR, normal S1, S2, no Murmurs. Vascular:  mild trophic changes, no open wounds Vessel Right Left  Radial Palpable Palpable  PT Not Palpable Not Palpable  DP Not Palpable Not Palpable  Gastrointestinal: soft, non-distended. No guarding/no peritoneal signs.  Musculoskeletal: M/S 5/5 throughout.  No visible deformity.  Neurologic: CN 2-12 intact. Pain and light touch intact in extremities.  Symmetrical.  Speech is fluent. Motor exam as listed above. Psychiatric: Judgment intact, Mood & affect appropriate for pt's clinical situation. Dermatologic: No rashes or ulcers noted.  No changes consistent with cellulitis.   CBC Lab Results  Component Value Date   WBC 10.2 11/25/2023   HGB 12.9 11/25/2023   HCT 39.7 11/25/2023   MCV 91.1 11/25/2023   PLT 217 11/25/2023    BMET    Component Value Date/Time   NA 138 11/25/2023 0055   NA 138 02/11/2020 1502   K 3.3 (L) 11/25/2023 0055   CL 101 11/25/2023 0055   CO2 25 11/25/2023 0055   GLUCOSE 232 (H) 11/25/2023 0055   BUN 16 11/25/2023 0055   BUN 14 02/11/2020 1502   CREATININE 1.10 (H) 11/25/2023 0055   CALCIUM  9.7 11/25/2023  0055   GFRNONAA 51 (L) 11/25/2023 0055   GFRAA 50 (L) 02/11/2020 1502   CrCl cannot be calculated (Patient's most recent lab result is older than the maximum 21 days allowed.).  COAG Lab Results  Component Value Date   INR 1.10 01/15/2015    Radiology VAS US  ABI WITH/WO TBI Result Date: 12/29/2023  LOWER EXTREMITY DOPPLER STUDY Patient Name:  Leean M Mesler  Date of Exam:   12/25/2023 Medical Rec #: [id number]      Accession #:    [id number] Date of Birth: 07-18-1945       Patient Gender: F Patient Age:   53 years Exam Location:  [facility] Procedure:      VAS US  ABI WITH/WO TBI Referring Phys: --------------------------------------------------------------------------------  Indications: Peripheral artery disease. High Risk         Hypertension, hyperlipidemia, past history of smoking, prior Factors:          CVA. Other Factors: Moderate fibrotic aortoiliac occlusive disease.  Performing Technologist: Donnice Charnley RVT  Examination Guidelines: A complete evaluation includes at minimum, Doppler waveform signals and systolic blood pressure reading at the level of bilateral brachial, anterior tibial, and posterior tibial arteries, when vessel segments are accessible. Bilateral testing is considered an integral part of a complete examination. Photoelectric Plethysmograph (PPG) waveforms and toe systolic pressure readings are included as required and additional duplex testing as needed. Limited examinations for reoccurring indications may be performed as noted.  ABI Findings: +---------+------------------+-----+----------+--------+ Right    Rt Pressure (mmHg)IndexWaveform  Comment  +---------+------------------+-----+----------+--------+ Brachial 151                                       +---------+------------------+-----+----------+--------+ PTA  84                0.55 monophasic         +---------+------------------+-----+----------+--------+ DP       108                0.70 monophasic         +---------+------------------+-----+----------+--------+ Great Toe80                0.52                    +---------+------------------+-----+----------+--------+ +---------+------------------+-----+----------+-------+ Left     Lt Pressure (mmHg)IndexWaveform  Comment +---------+------------------+-----+----------+-------+ Brachial 154                                      +---------+------------------+-----+----------+-------+ PTA      92                0.60 monophasic        +---------+------------------+-----+----------+-------+ DP       78                0.51 monophasic        +---------+------------------+-----+----------+-------+ Great Toe73                0.47                   +---------+------------------+-----+----------+-------+ +-------+-----------+-----------+------------+------------+ ABI/TBIToday's ABIToday's TBIPrevious ABIPrevious TBI +-------+-----------+-----------+------------+------------+ Right  0.70       0.52                                +-------+-----------+-----------+------------+------------+ Left   0.60       0.47                                +-------+-----------+-----------+------------+------------+   Summary: Right: Resting right ankle-brachial index indicates moderate right lower extremity arterial disease. The right toe-brachial index is abnormal.  Left: Resting left ankle-brachial index indicates moderate left lower extremity arterial disease. The left toe-brachial index is abnormal.  *See table(s) above for measurements and observations.  Electronically signed by Cordella Shawl MD on 12/29/2023 at 7:06:53 AM.    Final      Assessment/Plan 1. Atherosclerosis of native artery of both lower extremities with intermittent claudication (Primary)  Recommend:  The patient has evidence of atherosclerosis of the lower extremities with claudication.  The patient does not voice lifestyle limiting  changes at this point in time.  Noninvasive studies do not suggest clinically significant change.  No invasive studies, angiography or surgery at this time The patient should continue walking and begin a more formal exercise program.  The patient should continue antiplatelet therapy and aggressive treatment of the lipid abnormalities  No changes in the patient's medications at this time  Continued surveillance is indicated as atherosclerosis is likely to progress with time.    The patient will continue follow up with noninvasive studies as ordered.  - VAS US  ABI WITH/WO TBI; Future - VAS US  ABI WITH/WO TBI; Future  2. Essential hypertension Continue antihypertensive medications as already ordered, these medications have been reviewed and there are no changes at this time.  3. Hyperlipidemia, unspecified hyperlipidemia type Continue statin as ordered and reviewed, no changes at this time  4. Primary osteoarthritis involving  multiple joints Continue medications to treat the patient's degenerative disease as already ordered, these medications have been reviewed and there are no changes at this time.  Continued activity and therapy was stressed.    Cordella Shawl, MD  12/30/2023 12:13 PM

## 2024-12-23 ENCOUNTER — Ambulatory Visit (INDEPENDENT_AMBULATORY_CARE_PROVIDER_SITE_OTHER): Admitting: Vascular Surgery

## 2024-12-23 ENCOUNTER — Encounter (INDEPENDENT_AMBULATORY_CARE_PROVIDER_SITE_OTHER)
